# Patient Record
Sex: Female | Born: 1960 | Race: White | Hispanic: Yes | Marital: Married
Health system: Western US, Academic
[De-identification: ages and names within clinical notes are randomized; demographics above are authoritative.]

## PROBLEM LIST (undated history)

## (undated) MED FILL — APIXABAN 5 MG TABLET: 5 mg | ORAL | 96 days supply | Qty: 204 | Fill #0

## (undated) MED FILL — APIXABAN 5 MG (74 TABS) TABLETS IN A DOSE PACK: 5 mg (74 tabs) | ORAL | 30 days supply | Qty: 74 | Fill #0

## (undated) MED FILL — APIXABAN 5 MG TABLET: 5 mg | ORAL | 30 days supply | Qty: 60 | Fill #0

---

## 2021-06-11 IMAGING — MR RM - CRANIO (ENCEFALO)
10 of 13 series · 20 of 48 positions shown · non-contrast
Comparison: none

[Series 3: FLAIR · sagittal · 5.0mm · 0.49mm/px · 2 of 24 slices shown (1 of 2)]
[im 1/24]
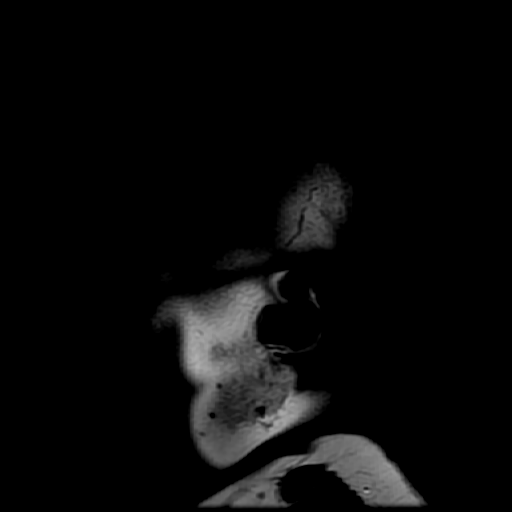
[im 24/24]
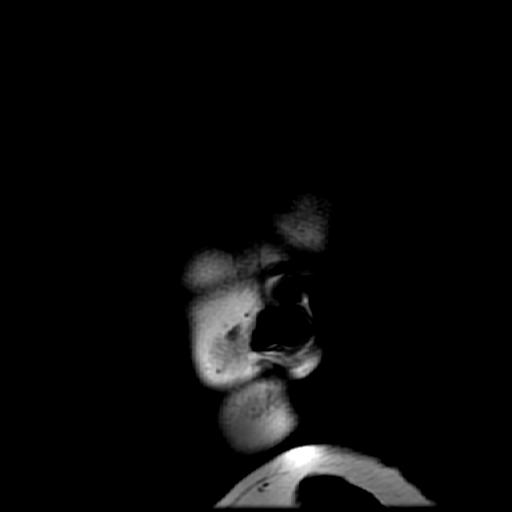

[Series 4: FLAIR · axial · 5.0mm · 0.47mm/px · z∈[-49,+99]mm · 2 of 24 slices shown (2 of 2)]
[im 1/24]
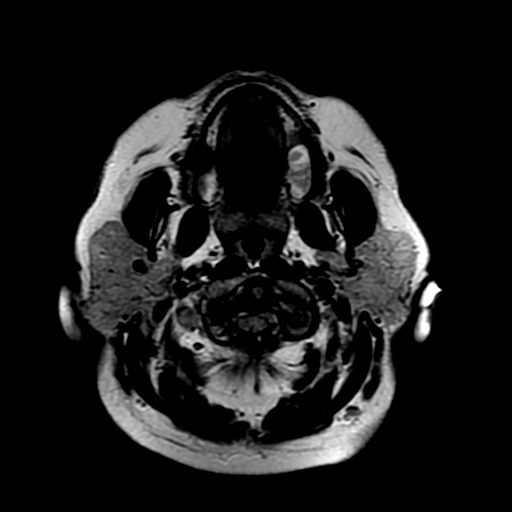
[im 24/24]
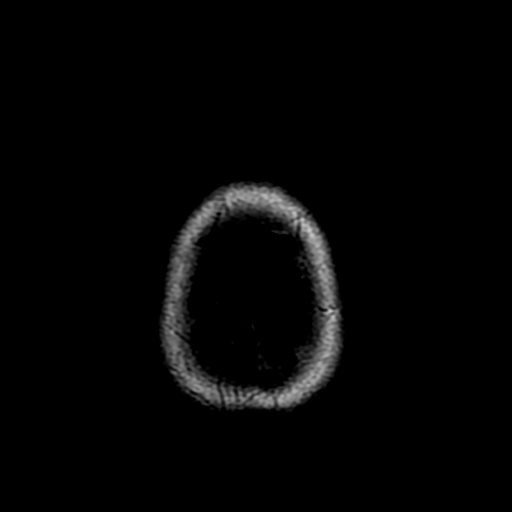

[Series 5: T2 · axial · 5.0mm · 0.47mm/px · 1 of 24 slices shown (1 of 2)]
[im 1/24]
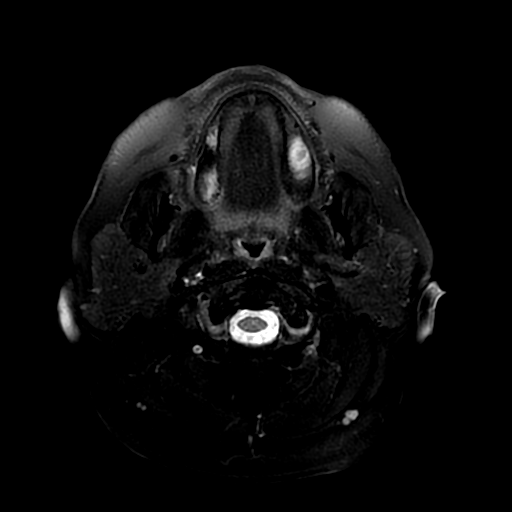

[Series 6: ax difusao · axial · 5.0mm · 0.94mm/px · z∈[-49,+99]mm · 3 of 48 slices shown]
[im 1/48]
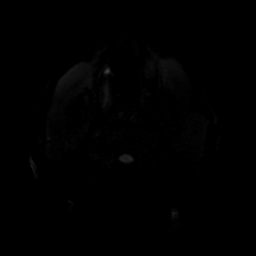
[im 24/48]
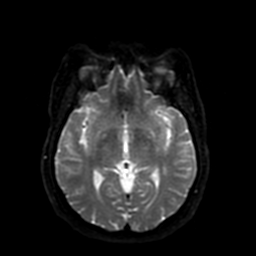
[im 48/48]
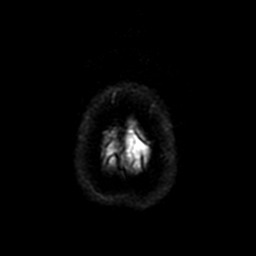

[Series 7: T2 · coronal · 4.5mm · 0.47mm/px · 1 of 24 slices shown (2 of 2)]
[im 1/24]
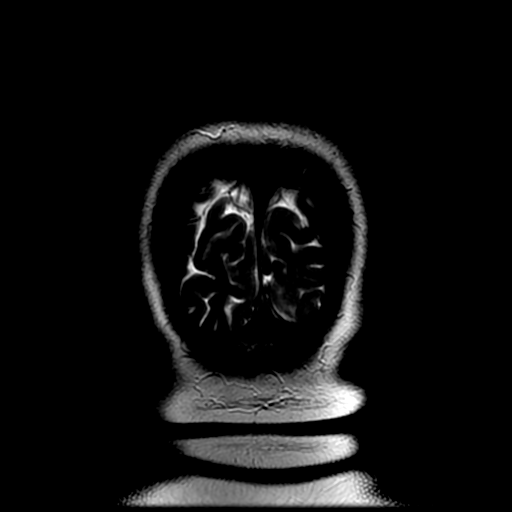

[Series 650: ADC · axial · 5.0mm · 0.94mm/px · 1 of 24 slices shown]
[im 1/24]
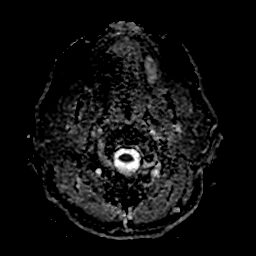

[Series 800: ax ref · axial · 1.4mm · 0.50mm/px · z∈[-88,+109]mm · 2 of 34 slices shown]
[im 1/34]
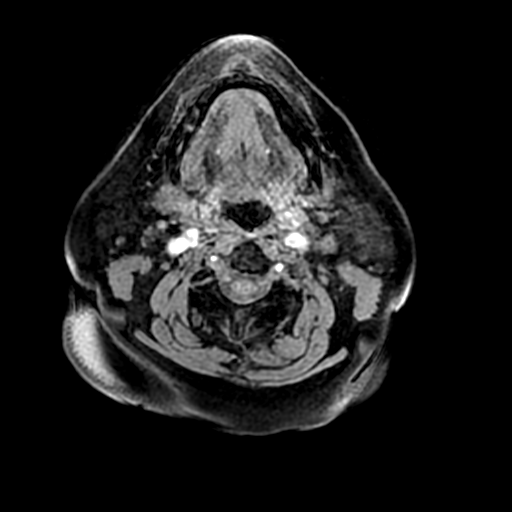
[im 34/34]
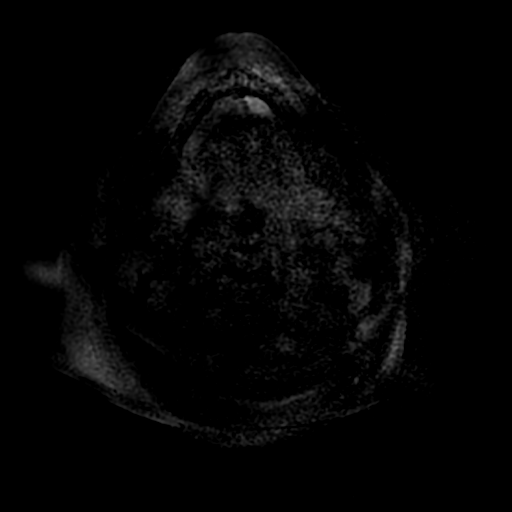

[Series 801: sag ref · sagittal · 1.1mm · 0.50mm/px · 2 of 40 slices shown]
[im 1/40]
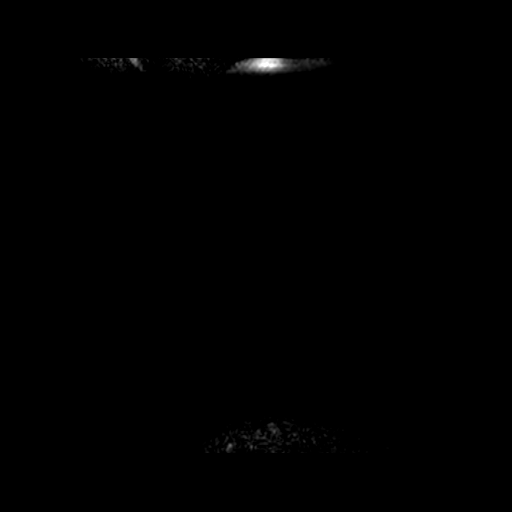
[im 40/40]
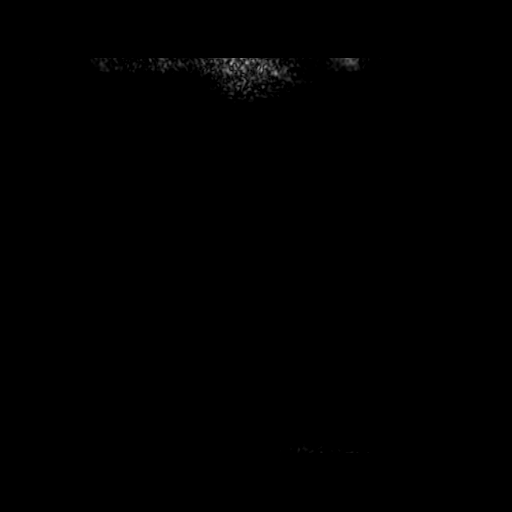

[Series 802: cor ref · coronal · 1.1mm · 0.50mm/px · 2 of 42 slices shown]
[im 1/42]
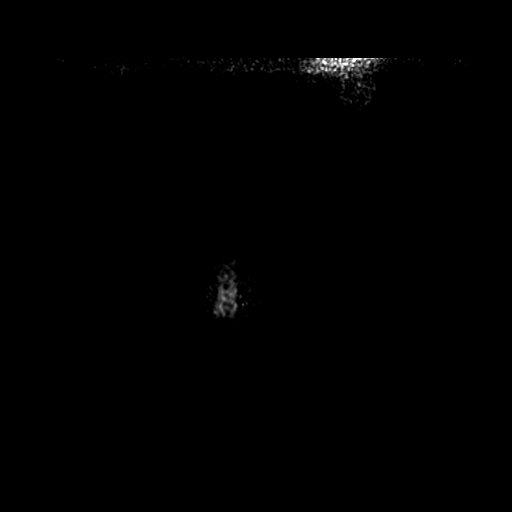
[im 42/42]
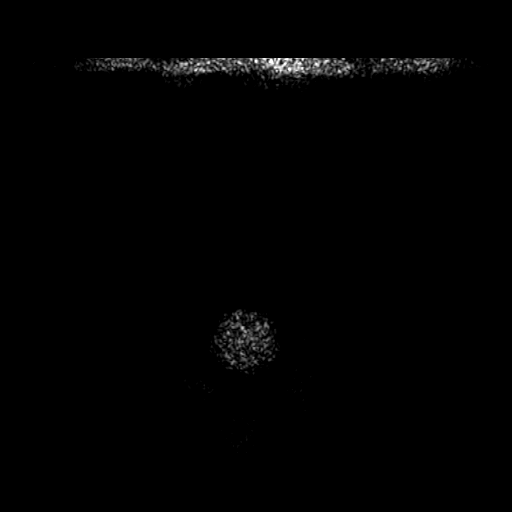

[Series 901: SWI · axial · 2.0mm · 0.47mm/px · z∈[-35,+105]mm · 4 of 72 slices shown]
[im 1/72]
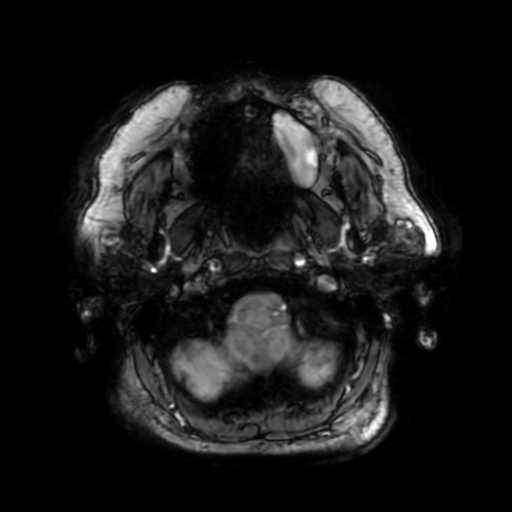
[im 24/72]
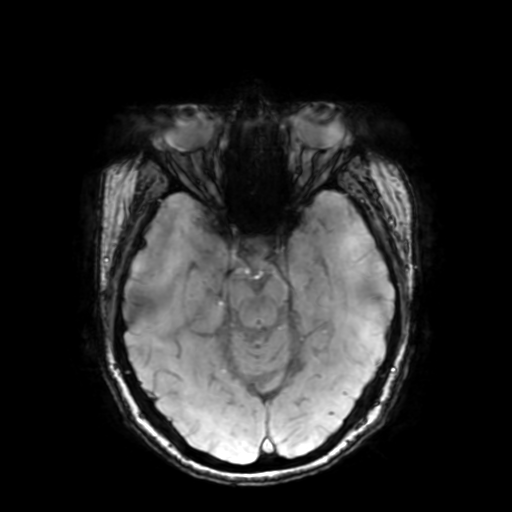
[im 48/72]
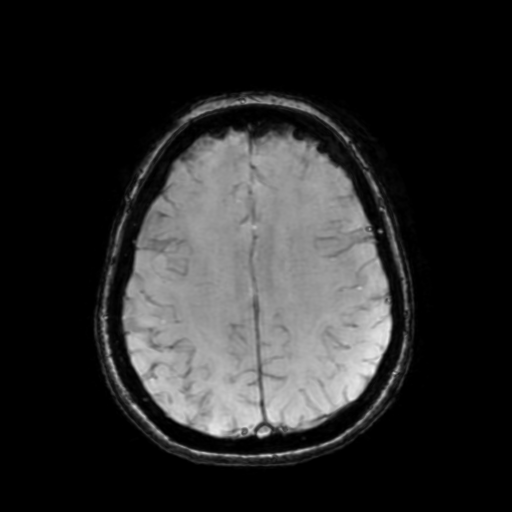
[im 72/72]
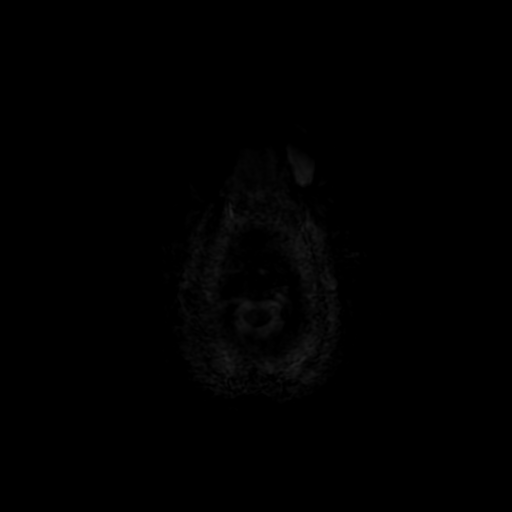

[20 of 48 positions shown; findings below may reference images not displayed]

Técnica:
Sequências multiplanares, ponderadas em T1, T2, FLAIR e Difusão.
Relatório:
Não  há  evidência  de  processo  expansivo  intracraniano,  hemorragia  intraparenquimatosa  aguda,  isquemia 
aguda/subaguda, bem como de coleções líquidas extra-axais acima ou abaixo do tentório.
Provável microangiopatia degenerativa leve ([place] 1), caracterizada por discretos focos de hipersinal em 
[facility]
T2  e  FLAIR  na  substância  branca  dos  lobos  frontais  e  patrietais,  em  situação  subcortical  e  periventricular, 
sem restrição à difusão e sem realce pelo contraste.
Hipersinal  cortical  no  giro   frontal  inferior  esquerdo  em  FLAIR,  não  expressivo  nas  demais  sequências, 
inespecífico. Artefatual?
O sistema ventricular é de topografia, morfologia e dimensões normais.
Não se identificam áreas com restrição a difusão da água na sequência ECOPLANAR.
Fluxo habitual ao nível das grandes artérias do sistema vértebro basilar e carotídeo, segundo o critério Spin-
Echo.
Sela túrcica parcialmente vazia.
Presença de megacisterna magna (variação anatômica).
Velamento quase completo do seio maxilar esquerdo por provável cisto de retenção mucoso.
Impressão:
Provável microangiopatia degenerativa leve (Fazekas 1).
Hipersinal  cortical  no  giro   frontal  inferior  esquerdo  em  FLAIR,  não  expressivo  nas  demais  sequências, 
inespecífico. Artefatual?
Velamento quase completo do seio maxilar esquerdo por provável cisto de retenção mucoso.

  Se  você  não  for  destinatário,  saiba  que  qualquer  divulgação,  cópia,  distribuição  ou  utilização  do  conteúdo  dessas 
informações é proibido e passível de punição dentro da lei.

## 2023-04-14 ENCOUNTER — Telehealth: Admit: 2023-04-14 | Discharge: 2023-04-14 | Payer: Commercial Managed Care - HMO | Attending: Physician

## 2023-04-14 DIAGNOSIS — M545 Low back pain, unspecified: Secondary | ICD-10-CM

## 2023-04-14 MED ORDER — OMEPRAZOLE 20 MG CAPSULE,DELAYED RELEASE
20 mg | Freq: Every day | ORAL | Status: AC
Start: 2023-04-14 — End: ?

## 2023-04-14 MED ORDER — MONTELUKAST 10 MG TABLET
10 | Freq: Every day | ORAL | Status: AC
Start: 2023-04-14 — End: ?

## 2023-04-14 MED ORDER — OZEMPIC 1 MG/DOSE (4 MG/3 ML) SUBCUTANEOUS PEN INJECTOR
1 mg/dose (4 mg/3 mL) | SUBCUTANEOUS | Status: DC
Start: 2023-04-14 — End: 2023-10-15

## 2023-04-14 MED ORDER — METOPROLOL SUCCINATE ER 25 MG TABLET,EXTENDED RELEASE 24 HR
25 | Freq: Every evening | ORAL | Status: AC
Start: 2023-04-14 — End: ?

## 2023-04-14 MED ORDER — BENZONATATE 100 MG CAPSULE: 100 mg | ORAL | Status: DC

## 2023-04-14 MED ORDER — BD INSULIN SYRINGE 1 ML 29 GAUGE X 1/2
1 | Freq: Two times a day (BID) | Status: AC
Start: 2023-04-14 — End: ?

## 2023-04-14 MED ORDER — LOSARTAN 25 MG TABLET
25 | Freq: Every day | ORAL | Status: AC
Start: 2023-04-14 — End: ?

## 2023-04-14 MED ORDER — HUMULIN 70/30 U-100 INSULIN KWIKPEN 100 UNIT/ML SUBCUTANEOUS: 100 unit/mL (70-30) | SUBCUTANEOUS | Status: AC

## 2023-04-14 MED ORDER — IBUPROFEN 600 MG TABLET
600 | Freq: Four times a day (QID) | ORAL | Status: AC | PRN
Start: 2023-04-14 — End: ?

## 2023-04-14 MED ORDER — ALBUTEROL SULFATE HFA 90 MCG/ACTUATION AEROSOL INHALER
90 mcg/actuation | Freq: Four times a day (QID) | RESPIRATORY_TRACT | Status: AC | PRN
Start: 2023-04-14 — End: ?

## 2023-04-14 MED ORDER — PIOGLITAZONE 30 MG TABLET
30 | Freq: Every day | ORAL | Status: AC
Start: 2023-04-14 — End: ?

## 2023-04-14 MED ORDER — JARDIANCE 25 MG TABLET: 25 mg | ORAL | Status: DC

## 2023-04-14 MED ORDER — ONETOUCH VERIO TEST STRIPS
Status: AC
Start: 2023-04-14 — End: ?

## 2023-04-14 MED ORDER — ATORVASTATIN 20 MG TABLET
20 mg | Freq: Every day | ORAL | Status: DC
Start: 2023-04-14 — End: 2023-10-15

## 2023-04-14 MED ORDER — METFORMIN 1,000 MG TABLET
1000 | Freq: Two times a day (BID) | ORAL | Status: AC
Start: 2023-04-14 — End: ?

## 2023-04-14 NOTE — Patient Instructions (Signed)
Bariatric Surgery: New Patient Information     Welcome to the [facility]!  Your surgeon has asked that you complete this list of tests and studies before you can be a candidate for bariatric surgery.  You do not need to repeat any of these tests if they have already been done in the last 1-2 years.  Just send us a copy of the final report for us to review.    Prior to scheduling surgery, the following tests will be needed. This can be done at [facility], or in conjunction with the patient's PCP.  __ ECG to document preoperative baseline, and completion of the Duke Activity Survey Index questionnaire in clinic.  (If patient exercise tolerance is <4 METs, patient will require a cardiac stress test).  __ Labs: CBC, CMP, lipid panel, iron panel, vitamin D 25OH, PT/INR, PTT, hemoglobin A1c, TSH, PTH.   __ H. pylori screening antibody. If positive, infection can be confirmed with urea breath test or stool antigen and the infection should be treated.  __ Evaluation by a mental health care practitioner (psychiatrist, psychologist, LSW, or therapist).  __ Evaluation by a dietitian.  __ Follow up in the [facility] with our bariatric PA.  __ Evaluation by a Primary Care Provider and letter of recommendation. The letter should document your dieting history and that all health maintenance screening, particularly cancer screenings, are up-to-date.    In addition, your insurance company may have additional requirements.  You should contact your insurance company now, to ensure that they cover bariatric surgery for you, and what their requirements are.    What should I do after my pre-operative items are complete?  Contact our clinic by phone or through MyChart.  If you completed your pre-operative items outside of [facility], please let us know so we can help determine if copies of the results need to be faxed our mailed to us.    [facility]  Surgery [facility]  [address] [place] [address]  tel: [phone], option [phone]  fax: [phone]  web: [email or web address]    Once the results have been reviewed by our team, we will schedule a pre-operative video visit appointment with your surgeon. It is at this appointment that a surgery date will be scheduled as well.       Bariatric Surgery 101: The Basics  Bariatric surgery is intended for people who have a Body Mass Index (BMI) of 40 or greater and who have not had success with other weight loss therapies such as diet, exercise, and medications. A person with a BMI of 35 or greater, with one or more obesity related co-morbidity, may also be considered for bariatric surgery.     Bariatric surgery has a history of helping patients effectively transform their health. Bariatric surgery restricts the amount of food patients can eat and makes them feel satiated much earlier in the digestive process. As a tool, bariatric surgery has impressive long-term weight loss results and, in many cases, has resolved or improved associated diseases like diabetes, high blood pressure, sleep apnea, painful joints, and stress urinary incontinence.      Bariatric surgery can give you a chance to change your life, provided you are ready to work hard and make a renewed and lasting commitment to a healthy lifestyle!    There are some important things to know about bariatric surgery:    Bariatric surgery is not cosmetic surgery.  Bariatric surgery does   not involve the removal of adipose tissue (fat) by suction or surgical removal.  You must understand the benefits and risks.  You must commit to long-term lifestyle changes, including diet and exercise, which are key to the success of bariatric surgery.  Complications after surgery may require further operations.    What are the Bariatric Procedures Offered at [facility]?    Gastric Bypass Surgery  The Roux-en-Y Gastric Bypass - often called gastric bypass - is considered the gold standard of  weight loss surgery.    The Procedure  There are two components to the procedure. First, a small stomach pouch, approximately one ounce or 30 milliliters in volume, is created by dividing the top of the stomach from the rest of the stomach. Next, the first portion of the small intestine is divided, and the bottom end of the divided small intestine is brought up and connected to the newly created small stomach pouch. The procedure is completed by connecting the top portion of the divided small intestine to the small intestine further down so that the stomach acids and digestive enzymes from the bypassed stomach and first portion of small intestine will eventually mix with the food.    The gastric bypass works by several mechanisms. First, similar to most bariatric procedures, the newly created stomach pouch is considerably smaller and facilitates significantly smaller meals, which translates into less calories consumed. Additionally, because there is less digestion of food by the smaller stomach pouch, and there is a segment of small intestine that would normally absorb calories as well as nutrients that no longer has food going through it, there is probably to some degree less absorption of calories and nutrients.    Most importantly, the rerouting of the food stream produces changes in gut hormones that promote satiety, suppress hunger, and reverse one of the primary mechanisms by which obesity induces type 2 diabetes.    Advantages  Produces significant long-term weight loss (60 to 80 percent excess weight loss)  Restricts the amount of food that can be consumed  May lead to conditions that increase energy expenditure  Produces favorable changes in gut hormones that reduce appetite and enhance satiety  Typical maintenance of >50% excess weight loss    Disadvantages  From a surgical perspective, it is a more technically complex operation when compared to the sleeve.  The complication rates, while very low, are  about double those of the sleeve.    With the malabsorption component, one can develop micronutrient deficiencies, particularly deficits in vitamin B12, iron, calcium, and folate.  These can be prevented with a daily multivitamin.  Requires adherence to dietary recommendations, life-long vitamin/mineral supplementation, and follow-up compliance    "The Sleeve"  The Sleeve Gastrectomy - often called "the sleeve" - is performed by removing approximately 80 percent of the stomach. The remaining stomach is a tubular pouch that resembles a banana.    The Procedure  This procedure works by several mechanisms. First, the new stomach pouch holds a considerably smaller volume than the normal stomach and helps to significantly reduce the amount of food (and thus calories) that can be consumed. The greater impact, however, seems to be the effect the surgery has on gut hormones that impact a number of factors including hunger, satiety, and blood sugar control.    Short term studies show that the Sleeve is as effective as the Roux-en-Y gastric bypass in terms of weight loss.     Advantages  Restricts the amount of food the   stomach can hold  Induces rapid and significant weight loss similar to Roux-en-Y gastric bypass for the first few years.  We are now starting to gain more long-term research data about this procedure.    Requires no re-routing of the food stream (Gastric Bypass)  Involves a relatively short hospital stay.  Typically, 1 night or less.    Causes favorable changes in gut hormones that suppress hunger, reduce appetite and improve satiety    Disadvantages  Is a non-reversible procedure  Has the potential for long-term vitamin deficiencies, although less likely when compared to the gastric bypass.  The long-term results not as well understood given the sleeve is a newer procedure.    About a 30% chance of new or worsening heartburn symptoms after surgery.  Symptoms can typically be controlled with acid reducing  medication; however, uncontrolled symptoms may require an additional surgery.     Lap Band Removal   The Adjustable Gastric Band - often called the band - involves an inflatable band that is placed around the upper portion of the stomach, creating a small stomach pouch above the band, and the rest of the stomach below the band.    This operation has fallen out of favor in the [place] and across most of the world due to high failure and complication rates.  While we no longer place lap bands, we can remove the band and convert to another bariatric surgery, such as the sleeve.  While each patient is unique, the conversion can possibly be completed in one surgery or may need to be staged as two separate surgeries.       How can I decide what procedure is right for me?  Your surgeon will evaluate your overall medical condition and typically recommend one procedure over another.  But the final decision is yours - you need to choose a procedure you are comfortable with, after you have researched the risks, benefits, and differences between them.    To help inform your decision, we offer the following resources:  EMMI educational videos: These videos provided easily digestible information to help you understand each surgery.  If you do not receive a link for this, please contact our office.   Our New Patient Orientation video on our website: [email or web address]  The [facility] website: [email or web address]  [facility] Support Group This offered through our Center for pre-operative and post-operative patients, as well as for family members and other interested persons. The meetings are virtual and held on Zoom at 5pm every 3rd Wednesday of the month.  The meeting schedule and lecture topics are available on the website above. Email us at: [email or web address]. Registration is not necessary to attend the support group.     Why should I establish care with a nutritionist or  dietician?  The best long-term success after weight-loss surgery occurs in patients who regularly see a nutritionist and change their eating habits for good.    The purpose of the pre-operative evaluation is to establish this relationship and improve your understanding of essential concepts in nutrition, such as food composition, caloric content, and calorie density. A basic knowledge of the proportion of carbohydrate, protein, and fat found in different foods, as well as training in portion control, is essential for your long-term success. Your nutritionist will also assess your readiness to change dietary habits in order to maintain a low calorie, volume-restricted diet for life.    Why is a mental health evaluation needed?  Weight-loss surgery requires a lifelong   commitment to nutritional supplementation and regular visits to your bariatric surgeon and primary care provider.  Some patients cannot make this commitment because of mental illness, depression, substance abuse, financial stress, or other life-stressors.    The purpose of your mental health evaluation is to assess whether you are mentally and emotionally prepared for the surgery, understand that the surgery is not reversible, and also understand the risks and alternatives. It should specifically address the following issues:  Screening for any of these psychiatric conditions: psychosis, major depression, obsessive-compulsive disorder, major eating disorder (such as bulimia), alcohol abuse, or substance abuse.  Establish your competency in making the decision to proceed with weight loss surgery.   Establish that you fully comprehend the seriousness of the procedure and the lifestyle and behavioral changes that must take place after it is done;   Establish that you have realistic expectations about the outcome of surgery and can handle the stress of the period following surgery.    If I smoke, can I still have weight loss surgery?  If you smoke, it is  very important that you stop smoking at least 6 weeks prior to your surgery.  This will help to improve healing and decrease the incidence of post-operative complications.    What do I do if my insurance coverage changes?  If you have an insurance change, please contact our office and provided us with the updated information.  Candidacy for this procedure depends upon adequate insurance coverage and the ability to obtain the required medications after discharge. About four to six weeks before your scheduled surgery, we will request verification of coverage from your insurance plan.    What if I have questions?  We will be happy to answer any questions you may have while you are awaiting your procedure.  Should you have any additional questions, please contact us at [phone], option [phone].    Preparing For Bariatric Surgery  To help make the surgical process as straightforward as possible, we have created a patient education guide called What to Expect: Bariatric Surgery, which you should receive after your pre-operative appointment.  See below for some highlights:    After you meet your surgeon for the pre-operative appointment, their practice coordinator will give you specific instructions about the date and time of the operation. Information that will be reviewed at the visit:    You will go on a preoperative liquid diet for 2 weeks prior to surgery.  You can choose to buy premade shakes, such as Premiere Protein Shakes, or purchase a protein powder and mix yourself.   You will drink three shakes a day.  Other liquids such as broth, coffee, popsicles, and jello are ok during this time, but no solid food.  The liquid diet helps to kickstart weight loss and shrink the size of the liver, which makes the operation easier for the surgeon to perform.  You will need to stop taking any nonsteroidal anti-inflammatory drugs (NSAIDs) one week before your scheduled operation date. Plan to never take these medicines ever  again because you will now be at an increased risk of gastro-intestinal ulcers. Examples of nonsteroidal anti-inflammatory drugs (NSAIDs) are: Naprosyn, Ibuprofen, Aleve, Aspirin, Celebrex, Advil/Motrin.  Your Anesthesia Prepare provider will give you detailed instructions regarding which of your medications to take or hold before surgery.  On the day before your operation you should have nothing to eat or drink after midnight. You will also take a mild bowel prep, Magnesium Citrate, around 10am the morning   before surgery.  This is an over the counter medication.      Your Stay in the Hospital    What to bring  Please bring a photo I.D. with you to the hospital. We recommend that you leave all valuables at home. If you have sleep apnea and use a CPAP machine at night, you should bring your machine with you. You may wish to bring a light robe and slippers, a toothbrush and any other toiletries that will make you more comfortable.    The morning of the operation  Be sure not to eat or drink anything.  You will report at the designated time to the Surgery Waiting Area on the main entry floor of [facility], [address]. You should check in with the receptionist and then be seated. When told by the operating room staff, a [facility] employee will escort you to the 4th Floor to a gurney in the pre-op area. You will then get into a hospital gown and begin the preparations for the operation.    After the operation, also called Post-Op Day 0  You will spend one or two hours in the post-anesthesia care unit, then be transferred to a room on the surgical floor. If you have severe sleep apnea, asthma, or a lung condition, you might need to spend a short period in the Intensive Care Unit.   Expect to be out of bed and walking. Your nurse will tell you when you are safe to walk by yourself. The first time you walk will be with your nurse.  Let your nurse know if you have pain that prevents you from moving. Oral  medications will be used for pain management and works best when used regularly. Ask for another dose before the pain gets too bad, so that we can avoid chasing your pain.  It is common to feel sick to your stomach after surgery now that your stomach is so small. A good strategy to reduce this feeling is to take small, frequent sips of fluids. This will help to reduce the risk of dehydration as well. We can also give you medication, if needed, to reduce this feeling.    Post-Op Day 1  Expect to be tolerating small sips of oral fluids and walking often to reduce the risk of blood clots.  You will be started on an acid reduction medication to reduce your risk of developing ulcers in your digestive tract.  The dietitian will review the bariatric diet recommendations with you. This is a great time to have your diet specific questions answered.  Plan to be discharged from the hospital the day after surgery.    Post-Op Day 2  You will likely be home at this point; however, some patients may need to stay another night at the hospital.  At this point you should spend most of the day out of bed and walking. Have a family member help. You will continue a liquid diet as well as oral pain medication.    Prepare for Discharge  You will be ready for discharge is you are meeting post-surgery goals: Walking on your own, tolerating liquids, and pain is well controlled  If you have a drain in place, this will be removed before you are discharged.  You will receive all medications needed at home through the [facility] program  You will be seen for post-operative follow-up through video visit with the bariatric clinic 1-3 weeks after surgery.        Post Operation Instructions For Your Care at Home  It is very common to experience nausea and loose stools in the first month. These symptoms will go away with time.    The 24-hour contact number for the Surgery Faculty Practice is [phone].  Please call this number with any  questions you might have. Call this number as soon as possible if any of the following symptoms occur:    1. Fever of greater than 101.5F  2. Nausea or vomiting (especially if you are unable to keep liquids down)  3. Severe pain at the incision  4. Increasing redness or foul-smelling drainage from the incision  5. Uncontrolled bleeding  6. Persistent diarrhea or more than 10 bowel movements in 24 hours  7. You are not able to urinate after 8 hours  8. If you experience dizziness, lightheadedness, or extreme fatigue    Medications & Pain Control  When you leave the hospital, you will be given prescriptions for several medications, some of which you will only take for a few months and others will be lifelong. These medications must be taken as prescribed and should be started within the first week of arriving home. You will be provided detailed instructions regarding the adjustment of chronic medications and addition of new medications.  Please contact our clinic with any questions.      You will be prescribed a medicine to help with pain control. Over time you will need less and less of the medicine, and you should gradually reduce the amount you take in the weeks after surgery. While Tylenol is ok, do not take any form of non-steroidal anti-inflammatory drugs (NSAIDS) such as the ones mentioned above!     Immediately following surgery and for at least the following 6 months, you will be prescribed a medicine that decreases acid production by your stomach in order to decrease the risk of ulcers.  This medication is a proton pump inhibitor (PPI), such as omeprazole (Prilosec) capsules 20mg daily. Your surgeon may recommend that you continue using an acid reduction medication for the rest of your life.    If you still have a gallbladder, you will be prescribed a medicine called Ursodiol to help prevent the formation of gallstones. The dose is 300mg twice a day.  This should be taken for 6 months after  surgery.    Vitamins and Minerals are Required Daily  Procare bariatric multivitamin once daily  Celebrate calcium + vitamin D chewable     While you will be able to pick up your first month supply from the [facility] at [place], no prescription is needed.  We recommend ordering both online for subsequent refills as that is the most affordable options.  Go to [email or web address] or [email or web address] for the multivitamin.  Go to [email or web address].com for the calcium chews.      Activity  It is common to feel a little more tired than usual in the first couple of weeks after surgery. This is to be expected, and you should listen to your body to determine the appropriate level of activity.    You may return to full activity, including work, when you feel ready.  After a minimally invasive surgery this usually occurs within two weeks.  If you had minimally invasive surgery, you may want to wait 4-6 weeks before resume rigorous activity.   You may perform normal activities, such as walking up and down stairs, walking outside the house, doing chores about the house and riding in a car   when you feel up to it.  You may drive after surgery as long as long as you are not taking any opioid pain medication.    Bowel Movements  You will likely have irregular bowel movements after bariatric surgery, but they will normalize over time.  If you have not had a bowel movement 4 days past your surgery, we recommend taking a dose of Milk of Magnesia. This will help for acute constipation by speeding up bowel motility.  To prevent constipation, we recommend adding powder fiber (Benefiber or Metamucil) with increased fluid intake, stool softner (Colace), smooth move tea, or small doses of prune juice.     Wound Care  You may take showers after your operation. Let the soap and water run over your incisions. If you had a gastric bypass, expect some drainage where the drain was removed.  This is normal and will resolve after a few days.    Follow-up  Contacts  Your first postoperative appointment should be scheduled within 1-3 weeks of leaving the hospital. If you don't have an appointment scheduled, MyChart your surgeon's office.    It is recommended that you meet regularly with a dietitian and join a support group in your community to assist in your lifestyle adjustments.     Post-Operative Follow-up Appointments  You will be seen by a member of our bariatric team via video visit.  If you wish to have a follow-up appointment with the dietitian, please let us know so the visit can be scheduled accordingly:  1-3 weeks after discharge  3 months post-op  6 months post-op  12 months post-op  every year thereafter    Monitoring lab values is important to avoid complications of malnutrition. Labs should start with the 3 month visit, and be drawn about 2 weeks before your appointment so the results can be discussed with your surgeon.  If you are getting your labs drawn at your local hospital, bring the printed results with you to your appointment.  These labs are recommended:  Complete blood count  Complete metabolic panel  Lipid Panel  Iron panel  Folate, thiamine, copper, iron, ferritin, selenium, zinc, vitamin A, D, B12  Hemoglobin A1C  PTH     Approach to Diet After Your Operation  The diet guidelines are designed to limit calories while providing a balanced meal plan to help prevent nutrient deficiencies and preserve muscle tissue. Tolerance and progression will vary greatly from patient to patient.    General Guidelines  Eat slowly and chew small bites of food thoroughly.  When you first start taking solids, avoid rice, bread, raw vegetables, and meats that are not easily chewed such as pork and steak. Ground meats are usually better tolerated.  Eat balanced meals with small portions.  Keep a daily record of your food portions, plus calorie and protein intake.  Concentrate on following a diet low in calories and sweets.  Avoid sugar, sugar-containing foods and  beverages, concentrated sweets, and fruit juices.    Fluids  Drink extra water and low calorie or calorie-free fluids between meals to avoid dehydration. Sip about one cup of fluid between each small meal, 6 to 8 times a day. At least two liters (64 ounces or 8 cups) of fluid a day is recommended. You will gradually be able to meet this target.    We strongly warn against the use of any alcoholic beverages. Alcohol will get absorbed into your system much more quickly than before, and the sedative and   mood- altering affects are more difficult to predict and control.    Protein  Preserve muscle tissue by eating foods rich in protein. High protein foods are eggs, meats, fish, seafood, tuna, poultry, soy milk, tofu, cottage cheese, yogurt, and other milk products. Your goal should be a minimum of 60-80 grams of protein per day. Do not worry if you cannot reach this goal in the first couple months after your operation.     Diet Progression After Bariatric Surgery  Please reference our "Diet after Bariatric Surgery" education guide for detailed directions regarding diet advancement after surgery.

## 2023-04-14 NOTE — H&P (Unsigned)
HPI  Thank-you for referring Susan Goodwin to the [facility] at the [place] [facility] [place] [place].  As you know, she is a 61 year old with severe obesity and obesity-related comorbidities who presents for consideration of bariatric surgery.  This is the patient's first visit to the [facility].    Today's  .  Her BMI (body mass index) today is calculated at , based on her disclosed height of: Marland Kitchen  Her goal weight is *** pounds.     She has tried multiple conservative measures to achieve weight loss, including dietary, pharmacologic, or exercise programs. Although she has been able to lose some weight from diets and exercise, over time, the weight has always returned, and she has remained morbidly obese.  She has reviewed the [facility] new bariatric patient orientation videos in order to understand the risks and the benefits of bariatric surgery. She now feels committed to proceeding with an operation to help with the weight loss and presents today for further consideration of bariatric surgery.       There is no problem list on file for this patient.    No past medical history on file.  No past surgical history on file.   Allergies/Contraindications  Not on File  No current outpatient medications on file.     No current facility-administered medications for this visit.     Social History     Socioeconomic History    Marital status: Married     Spouse name: Not on file    Number of children: Not on file    Years of education: Not on file    Highest education level: Not on file   Occupational History    Not on file   Tobacco Use    Smoking status: Not on file    Smokeless tobacco: Not on file   Substance and Sexual Activity    Alcohol use: Not on file    Drug use: Not on file    Sexual activity: Not on file   Other Topics Concern    Not on file   Social History Narrative    Not on file     Social Determinants of Health     Financial Resource Strain: Not on file   Food Insecurity:  Not on file   Transportation Needs: Not on file   Housing Stability: Not on file     No family history on file.    Physical Exam  There were no vitals filed for this visit.  There is no height or weight on file to calculate BMI.  Constitutional:       Appearance: Patient is well-developed. Patient is not diaphoretic.   Pulmonary:      Effort: Pulmonary effort is normal. No respiratory distress.   Abdominal:      General: She is obese   Skin:     Coloration: Skin is not pale.   Neurological:      Mental Status:She  is alert and oriented to person, place, and time.   Psychiatric:         Behavior: Behavior normal.         Thought Content: Thought content normal.         Judgment: Judgment normal.     D[facility] Activity Survey Index       No data to display              Assessment and Plan:  In summary, Susan Hesselbach  Kiomara Goodwin is a 62 year old female with morbid obesity and associated comorbidities.  She has a BMI of .  She presents for consideration of bariatric surgery.  She does satisfy the criteria set forth for bariatric surgery by ASMBS/IFSO, published in 2022.    Prior to scheduling surgery, the following tests will be needed. This can be done at [facility], or in conjunction with the patient's PCP.  {barinewpreoprequirements:36251::"__ ECG to document preoperative baseline, and completion of the [employer] Activity Survey Index questionnaire in clinic.  (If patient exercise tolerance is <4 METs, patient will require a cardiac stress test).","__ Labs: CBC, CMP, lipid panel, iron panel, vitamin D 25OH, PT/INR, PTT, hemoglobin A1c, TSH, PTH. ","__ H. pylori screening antibody. If positive, infection can be confirmed with urea breath test or stool antigen and the infection should be treated.","__ Evaluation by a mental health care practitioner (psychiatrist, psychologist, LSW, or therapist).","__ Evaluation by a dietitian.","__ Follow up in the [facility] with our bariatric PA.","__ Evaluation by a Primary Care Provider and letter  of recommendation. The letter should document your dieting history and that all health maintenance screening, particularly cancer screenings, are up-to-date."}    I have provided the patient with the list of these required tests and consultations.    We discussed benefits and risks of gastric bypass surgery.  We explained a complication rate of 12%, and a mortality rate of 0.2%.  Specific complications were explained in detail, including leakage from the gastrojejunostomy or jejunojeunostomy, pulmonary embolism, pneumonia, deep vein thrombosis, renal failure, stroke, heart attack, hernia, wound infection, bleeding, nausea, marginal ulceration, bowel obstruction, persistent obesity, weight regain, and dumping syndrome. We also explained our current understanding of how gastric bypass works: the small gastric pouch restricts oral intake. Additionally, gastrointestinal hormonal secretion is altered, which in turn increases satiety.  We also explained that the excluded stomach cannot be visualized by regular endoscopy after the gastric bypass, nor is the biliary tree accessible by endoscopy. Also, we explained the possibility for conversion to an open procedure because of extensive adhesions, bleeding, or inadequate exposure.  We quoted a likelihood for conversion to be less than 2%.    We discussed sleeve gastrectomy as another weight-loss option.  Specific complications of sleeve gastrectomy include nausea or vomiting, leakage (<1%), bleeding (<2%), heartburn (30-40%), and weight-regain.  The mortality rate is 0.1%.    We explained the eventual need for plastic surgery for excessive skins folds after weight loss. Additionally, we spoke about the need for consistent long-term postoperative follow-up with scheduled clinic visits and laboratory tests, changes in lifestyle, diet, and eating habits and the need for supplemental calcium and vitamins was explained.    The patient understands that surgery is only a tool to  help with weight loss, and that she will need to maintain a carefully organized and attentive nutritional program with the help of a dietician both before and after surgery.  She understands these plans, the technical aspects of surgery and risks involved, again including both those associated with anesthesia and surgery.  The patient was given the opportunity to ask questions, and all questions were answered.    After our thorough discussion today, she is primarily interested in the {bariatricsurgeries:38194} procedure, and this discussion will be reaffirmed at our next clinic visit.    Documentation of Visit Statement  I spent a total of 75 minutes in face-to-face time with the patient and in non-face-to-face activities conducted today, 04/12/23, directly related to this video visit, including reviewing  records and tests, obtaining history and exam, placing orders, communicating with other healthcare professionals, counseling the patient, family, or caregiver, documenting in the medical record, and/or care coordination for the diagnoses above.    I performed this consultation using real-time Telehealth tools, including a live video connection between my location and the patient's location. Prior to initiating the consultation, I obtained informed verbal consent to perform this consultation using Telehealth tools and answered all the questions about the Telehealth interaction.  I performed this visit outside a [facility] faciliity.    Renato Shin, am acting as a Neurosurgeon for services provided by Alroy Dust, MD on 04/14/2023 11:34 AM    ***

## 2023-05-19 ENCOUNTER — Other Ambulatory Visit: Payer: Commercial Managed Care - HMO | Attending: Registered"

## 2023-05-19 NOTE — Telephone Encounter (Signed)
Estimated body mass index is 57.61 kg/m as calculated from the following:    Height as of 04/14/23: 157.5 cm (5\' 2" ).    Weight as of 04/14/23: 142.9 kg (315 lb).    Consult Date: 04/14/23  Surgeon: Dr. Aundria Rud    09/22/23 -  ECG  NSR    09/22/23 - NM Stress test  1.  Negative study for ischemia or scar.  2.  Overall gated left ventricular systolic function was normal with a calculated LVEF of 66% at stress and 63% at rest.  3.  No left ventricular dilatation    Stress EKG Report  1. The ECG portion of this test is negative for Lexiscan induced ischemia.  2. Scintigraphy (nuclear imaging) is reported separately.    05/20/23 - Upper GI Endoscopy (scan clin)  - The nasopharynx was normal  - The examined esophagus was normal  - Localized mildly erythematous mucosa without bleeding was found in the gastric antrum. Biopsies were taken with a cold forceps for histology.  - The examined duodenum was normal.    Stomach, body and entrance, biopsy:  - Chronic inactive gastritis  - No intestinal metaplasia, dysplasia, Helicobacter pylori or neoplasm is evident    07/16/20 - Colonoscopy  - One 3 mm polyp in the sigmoid colon, removed with a cold biopsy forceps. Resected and retrieved.  - Diverticulosis in the sigmoid colon and in the descending colon    09/22/23 -  Pre Op Evaluation Labs  CMP ALT 23   CBC wnl   Lipid panel wnl    PT/INR, PTT wnl   hemoglobin A1C  6.7   TSH, PTH  wnl   Iron, ferritin  wnl   Vitamin D 25(OH) 65   H.pylori screening (05/20/23) Negative on EGD     05/21/23 -  Evaluation by a mental health care practitioner for clearance for bariatric surgery  Cleared     07/02/23 -  Evaluation by your Primary Care Provider and letter of recommendation/clearance for bariatric surgery  Cleared  (scan clin)    05/19/23 -  Evaluation by a nutritionist or dietician  Cleared     Alcohol, drug, and tobacco screening completed: Yes    Insurance: [employer] which requires 3 month  07/19/23  08/16/23  09/14/23  09/30/23

## 2023-05-19 NOTE — Progress Notes (Signed)
This consultation was performed using real-time Telehealth tools, including a live video connection between my location and the patient's location. Prior to initiating the consultation, verbal consent was obtained to perform this consultation using Telehealth tools and answered all the questions about the Telehealth interaction.    Nutrition Evaluation:    DOS: 05/19/23    [facility]    Susan Goodwin  is a  62 y.o. yo female with morbid obesity for bariatric surgery nutrition evaluation.  Pt. is interested in gastric bypass surgery procedure.         Nutrition -related medical history: DM ( ozempic, OHA), HTN, GERD, OA, OSA, asthma, CKD1, HLD      Reason for surgery:  "knees painful; no surgery untl weight loss but did not tell me how.."; for health    Known bariatric patients?  Yes    Successful?              Yes          Anthropometric Data and Assessment:    Physical:    HT   Ht Readings from Last 1 Encounters:   04/14/23 157.5 cm (5\' 2" )       WT:   Wt Readings from Last 3 Encounters:   04/14/23 (!) 142.9 kg (315 lb)       IBW:  286% IBW (110# +10%).  High wt: 317#.  Goal wt: 150- 160#.    Ideal Body Weight: 50.01 kg    Calculation weight BMI: 57.6 kg/m^2 -  Severe   obesity    Weight history:  Gain began with DM dx d/t stated medications - Use to be 140# pre DM  Dieting history  Multiple diet programs, pills w/o permanent success.      Physical Activity:   Physical activity details: limited - "can only walk wtih two canes in house" - electric chair outside        Food and Nutrition Intake History:   Information obtained from: Information Provided By: Patient      Oral Food and Beverage Intake    B - toast/ jelly/ butter, black coffee; drinks "a lot of water"; L - vegetables omelette, one tortilla; sn - fruits, vegetables; D - chili rellanos cooked or take out; vegetables/ greens;  sn - bean taco.      Other:  Notes sometimes grab cookies when no one around to give food - Cannot prepare  meals d/t mobility issues; spouse on call and listening to advise about vegetable tray  with protein foods      Assessment:  Inadequate fiber/plant foods; excess total/refined carbohydrates (starch, snacks); excess total/saturated fats (dairy, meats, added, snacks); inadequate free fluids; irregular meals at times      Nutrition Diagnosis:     Excess energy intake and inadequate physical activity related to food/nutrition knowledge deficit as evidenced by diet recall and morbid obesity.      Nutrition Intervention/Goals:  [x]   Evaluation for bariatric surgery completed.  Pt. appears to be a good candidate for bariatric surgery d/t comorbidities and history of unsuccessful weight loss attempts.  Pt. could possibly lose weight with professional supervision and has proven weight loss with diet/exercise but at a very slow rate.    [x]   Post-bariatric surgery MNT reviewed with written material provided to include progression of diet from liquid to pureed to soft based on surgery type and daily need for vitamin/mineral supplementation with written information provided.  Bariatric plate with visual reviewed to include  goals for successful long-term weight loss.  [x]   Plate method of meal planning discussed with written material provided to include post bariatric plate and post surgery diet progression to assist with weight loss pre-surgery and to begin preparation for post surgery.  Continue nutrition education highly recommended prior to surgery to aid success.       1) Plan regular meals with portion control & balanced nutrients:  Lean protein + vegetables + less starch/fats - 3 meals, minimal snacks       2) Reduce total fat/carbohydrate - Improve choices, healthier options, use less       3) Limit liquid sugar & alcohol - Use sugar free, dilute, eliminate       4) Increase fiber/plant foods - non-starchy vegetables       5) Adequate free fluids intake - 6-8 cups/day and more       6) Search for bariatric recipes,  support  [x]   Importance of regular, consistent exercise reviewed with goal of 60 min./day for successful weight loss.  Begin to establish habit:   20-30 minutes 3-5x/wk and increase to 60+ minutes each day, as tolerated.  Consider chair or pool exercise.  [x]   Self -monitoring methods discussed to help record intake, exercise, lifestyle habits to aid in increased weight loss success and to review with RD at post-op follow-up appt.  Consider utilizing nutrition and exercise web sites, applications, note book pre-surgery to aid change.  [x]   Eating Behavior:  Begin addressing reasons behind eating behaviors and seek new coping skill.  Elicit aid of mental health professional and seek other support to aid change.  [x]   Nutrition Education:  Seek continued nutrition education prior to surgery to aid success - [email or web address].org/ and other web resources discussed.  Request RD referral.  Seek weight loss programs, i.e., [facility]      Nutrition Monitoring & Evaluation:  [x]   Pt will seek continued nutrition education and MNT for weight management prior to surgery.  [facility], [facility] Management Program information provided  [x]   Pt. to meet with hospital RD s/p surgery  [x]   Pt. to arrange f/u RD visit at [facility], [facility] or with local RD.  Contact information provided.  [x]   Pt. to seek f/u support at [facility] or local support group.  Internet resources encouraged.  Barriers to learning:  None     Verbalize understanding:  Yes  Compliance expectancy:  Expected to be fair-good.  Pt. appears motivated to improve health via successful and permanent weight loss but would benefit from support.      Remonia Richter, RD, CDE  [place] bariatric dietitian

## 2023-05-21 ENCOUNTER — Telehealth: Payer: Commercial Managed Care - HMO | Attending: Forensic Psychiatry

## 2023-05-21 DIAGNOSIS — E669 Obesity, unspecified: Secondary | ICD-10-CM

## 2023-05-21 NOTE — Progress Notes (Unsigned)
Susan Goodwin, M.D.  [facility]  [address], 6[address]  [place], [place], [address]    05/21/2023    I had the pleasure of seeing Susan Goodwin for the purpose of a psychiatric evaluation prior to possible bariatric surgery. The patient is a 62 y.o. female with severe obesity who is being considered for bariatric surgery.     I performed this consultation using real-time Telehealth tools, including a live video connection between my location and the patient's location. Prior to initiating the consultation, I obtained informed verbal consent to perform this consultation using Telehealth tools and answered all the questions about the Telehealth interaction.     {Time spent options (optional):28235::" "}       The patient has no formal psychiatric history. The patient has not received inpatient or outpatient mental health treatment. The patient has not received psychotropic medications. The patient denied a history of suicide attempts, suicidal ideation, and violence. The patient has no history of manic or psychotic symptoms. The patient does not currently endorse symptoms that would meet the diagnostic criteria for a depressive disorder. The patient does not endorse a history of depressive episodes. The patient does not endorse having been exposed to stressors that would be expected to predispose the patient to develop symptoms of posttraumatic stress disorder. The patient does not endorse symptoms that would meet the diagnostic criteria for an anxiety disorder. The patient denies a history of eating disorder symptoms.    The patient's social history is listed here:    Social History     Socioeconomic History    Marital status: Married     Spouse name: Not on file    Number of children: Not on file    Years of education: Not on file    Highest education level: Not on file   Occupational History    Not on file   Tobacco Use    Smoking status: Never    Smokeless tobacco: Not on  file   Substance and Sexual Activity    Alcohol use: Not Currently    Drug use: Never    Sexual activity: Not on file   Other Topics Concern    Not on file   Social History Narrative    Not on file     Social Determinants of Health     Financial Resource Strain: Medium Risk (04/13/2023)    Overall Financial Resource Strain (CARDIA)     Difficulty of Paying Living Expenses: Somewhat hard   Food Insecurity: Food Insecurity Present (04/13/2023)    Hunger Vital Sign     Worried About Running Out of Food in the Last Year: Often true     Ran Out of Food in the Last Year: Sometimes true   Transportation Needs: No Transportation Needs (04/13/2023)    PRAPARE - Therapist, art (Medical): No     Lack of Transportation (Non-Medical): No   Housing Stability: High Risk (04/13/2023)    Housing Stability Vital Sign     Unable to Pay for Housing in the Last Year: Yes     Number of Times Moved in the Last Year: Not on file     Homeless in the Last Year: Not on file       The patient identified post-operative supports as ***. These individuals are healthy enough to help the patient post-operatively. The patient denied a family history of substance use problems. The patient denied a  family history of psychiatric problems. The patient denied a family history of suicidality.     The patient now presents for pre-operative evaluation for bariatric surgery.      The patient's history, allergies, and medications are:    Past Medical History:   Diagnosis Date    Acid reflux     Asthma     Diabetes mellitus (CMS code)     Essential hypertension     High cholesterol     History of esophagogastroduodenoscopy (EGD)     Knee pain     Low back pain     Obesity     Sleep apnea     Uterine cancer (CMS code)        Past Surgical History:   Procedure Laterality Date    ANKLE FRACTURE SURGERY      CARPAL TUNNEL RELEASE      FINGER SURGERY      screw placement    HYSTERECTOMY      NASAL FRACTURE SURGERY      SHOULDER SURGERY       TUBAL LIGATION         Allergies/Contraindications   Allergen Reactions    Atorvastatin      myalgia    Lovastatin      diarrhea    Lisinopril      bad cough   bad cough    bad cough       Current Outpatient Medications   Medication Sig Dispense Refill    albuterol 90 mcg/actuation metered dose inhaler Inhale 2 puffs into the lungs continuous prn      BD INSULIN SYRINGE 1 mL 29 gauge x 1/2" SYRINGE 2 (two) times daily      HUMULIN 70/30 U-100 KWIKPEN 100 unit/mL (70-30) injection pen Inject 40 Units under the skin in the morning and 40 Units in the evening. Inject before meals. Holds for blood sugar less than 100.      ibuprofen (ADVIL,MOTRIN) 600 mg tablet Take 1 tablet (600 mg total) by mouth 4 (four) times daily as needed      losartan (COZAAR) 25 mg tablet Take 1 tablet (25 mg total) by mouth daily      metFORMIN (GLUCOPHAGE) 1,000 mg tablet Take 1 tablet (1,000 mg total) by mouth 2 (two) times daily      metoprolol succinate (TOPROL-XL) 25 mg 24 hr tablet Take 1 tablet (25 mg total) by mouth every evening      montelukast (SINGULAIR) 10 mg tablet Take 1 tablet (10 mg total) by mouth daily      omeprazole (PRILOSEC) 20 mg capsule Take 1 capsule (20 mg total) by mouth      ONETOUCH VERIO TEST STRIPS test strip TEST BLOOD SUGAR TWO TIMES A DAY. E11.65      OZEMPIC 1 mg/dose (4 mg/3 mL) injection pen Inject 1 mg under the skin every 7 (seven) days      pioglitazone (ACTOS) 30 mg tablet Take 1 tablet (30 mg total) by mouth daily      atorvastatin (LIPITOR) 20 mg tablet Take 1 tablet (20 mg total) by mouth daily       No current facility-administered medications for this visit.       The patient has attended an orientation regarding bariatric surgery. The patient has met with a nutritionist. The patient has completed/is completing the pre-operative work-up. The patient is making dietary changes in the form of ***. The patient is engaging in exercise in the form  of ***. The patient has/has not attended bariatric  surgery support group meeting(s). The patient demonstrated an ability to discuss the type of bariatric surgery being considered. The patient demonstrated a limited understanding of surgical risks. The patient understood the need for ongoing lifestyle changes following bariatric surgery. The patient feels capable of adhering to the post-operative diet. The patient appears to have a stable and available support network. The patient demonstrated realistic goals for the surgery.     Mental Status Examination: The patient presented as an obese but otherwise well-developed and well-nourished appearing individual. The patient was appropriately dressed and groomed. The patient did not display psychomotor agitation or slowing. The patient did not display abnormal or involuntary movements. The patient was polite and cooperative. The patients speech was fluent, articulate, and not pressured. The patients thought process was linear, logical, and goal directed. The patient denied suicidal and homicidal ideation. The patient did not endorse psychotic symptoms. The patients mood was characterized as as noted above. The patients affect appeared euthymic, appropriate, and reactive. The patient did not demonstrate significant cognitive deficits during our meeting. The patients insight and judgment appeared intact.     Impression: Based on my examination, it is my impression the patient is an acceptable candidate for bariatric surgery from a psychiatric perspective. The patient's history is consistent with the diagnosis/diagnoses of:      This condition/These conditions would not be expected to interfere with the patient's ability to undergo bariatric surgery or to adhere to the post-operative lifestyle changes.    The patient does not meet the diagnostic criteria for a mood, anxiety, eating, cognitive, substance use, or psychotic disorder. In light of these considerations, the patient does not have any psychiatric  contraindications to undergoing bariatric surgery. Further, the patient does not demonstrate evidence of any psychiatric issues that would interfere with the ability to adhere to post-procedure lifestyle changes. The patient appears to have the capacity to provide informed consent for a bariatric procedure.      Recommendations: For obesity and bariatric surgery, I would recommend that the treatment team provide the patient with ongoing education about the procedure, the attendant risks, and the post-operative requirements in order to reinforce the patients understanding of this information. Involving members of the patients support network in these discussions may further increase the likelihood of ongoing compliance in the post-operative period. The patient was provided information about lifestyle changes and strategies to help make lifestyle changes.    The patient may consider cognitive behavioral therapy for weight control in the pre-operative and post-operative intervals and was provided information about pursuing that intervention. This treatment would not be considered a prerequisite for bariatric surgery.     The patient was educated about pursuing treatment for ***. This would/would not be considered a prerequisite for bariatric surgery.    Please do not hesitate to contact me if you have any questions or concerns.    Sincerely,      Susan Goodwin, M.D.  Clinical Professor  [place] [facility]  Tel: [phone]  Fax: [phone]

## 2023-06-07 ENCOUNTER — Telehealth: Payer: Commercial Managed Care - HMO | Attending: Physician Assistant

## 2023-06-07 NOTE — Progress Notes (Signed)
I performed this consultation using real-time Telehealth tools, including a live video connection between my location and the patient's location. Prior to initiating the consultation, I obtained informed verbal consent to perform this consultation using Telehealth tools and answered all the questions about the Telehealth interaction.    HPI  I saw Susan Goodwin, DOB [birth year], today at the [facility], Bariatric Surgery Program: [facility].  She is here for documentation of non surgical weight loss attempts to prepare for bariatric surgery.    Diet-wise, trying to eat more vegetables like broccoli, salads, spinach. Eating small portions. Sometimes only eats 2x/day. Snacks: fruit, greek yogurt.    Hydration-wise, drinks 3-4 water bottles a day. No soda, juice or alcohol.    For physical activity, limited by chronic knee pain. Needs to lose weight for knee surgery.    She sees a cardiologist locally.    She has completed all of her pre-op labs: No, she has completed locally.  She has completed her ECG: No, she has completed locally.     She has completed her nutrition visit: Yes  She has completed her psych visit: Yes     PE  Vitals: Wt (!) 142.4 kg (314 lb)   BMI 57.43 kg/m   Constitutional: oriented to person, place, and time.    Assessment & Plan  1. Morbid obesity (CMS code)  - Will continue to attend our [place] [facility] for remaining non surgical weight loss documentation.  - Continue to increase protein intake and decrease carbohydrate and sugar intake  - Review educational guides  - Added to support group mailing list  - Mailed educational guides  - Eat at least 3 meals a day, increase hydration    2. Preop testing  - Follow up in 1 month to review pre-op requirements: labs, ecg, stress test  - PCP rec letter template faxed to PCP  and mailed to patient  - She will follow up with cardiologist to order stress test    3.  Osteoarthritis of both knees, unspecified osteoarthritis type  - chronic knee pain, needs to lose weight for knee surgery    APP Visit Information:     APP Service Type:  Independent  Available MD consultant:  Alroy Dust, MD    I spent a total of 40 non-overlapping minutes on this patient's care on the day of their visit excluding time spent related to any billed procedures. This time includes time spent with the patient as well as time spent documenting in the medical record, reviewing patient's records and tests, obtaining history, placing orders, communicating with other healthcare professionals, counseling the patient, family, or caregiver, and/or care coordination for the diagnoses above.        Lisbeth Renshaw, PA-C  [facility]: Bariatric Surgery         [facility]  [address]  [place], [place] [address]  Ph: [phone] (OPT-5)  Fax: [phone]

## 2023-07-19 ENCOUNTER — Telehealth: Payer: Commercial Managed Care - HMO | Attending: Physician Assistant

## 2023-07-19 NOTE — Progress Notes (Signed)
I performed this consultation using real-time Telehealth tools, including a live video connection between my location and the patient's location. Prior to initiating the consultation, I obtained informed verbal consent to perform this consultation using Telehealth tools and answered all the questions about the Telehealth interaction.    HPI  I saw Susan Goodwin, DOB March 04, 1961, today at the [facility], Bariatric Surgery Program: [facility].  She is here for documentation of non surgical weight loss attempts to prepare for bariatric surgery.    Diet-wise, she does not buy bread anymore.  Yesterday diet recall:  - Breakfast: spinach, mushrooms, green onions, eggs  - Lunch: chicken salad, lettuce  - Dinner: 1/4 [place] sandwich - cheese, ham, lettuce, tomatoes  Snacks: strawberries    Hydration-wise, drinks at least 4 water bottles a day.    She sees a cardiologist locally.      She has completed all of her pre-op labs: Missing some, she will complete locally.  She has completed her ECG: No, she will complete at her cardiologist.     She has completed her stress test: No, she will complete at her cardiologist.  Scheduled with cardiologist 08/05/23.  She has completed her nutrition visit: Yes  She has completed her psych visit: Yes     PE  Vitals: Wt (!) 141.1 kg (311 lb)   BMI 56.88 kg/m   Constitutional: oriented to person, place, and time.    Assessment & Plan  1. Morbid obesity (CMS code)  - Will continue to attend our [place] [facility] for remaining non surgical weight loss documentation.  - Continue to increase protein intake and decrease carbohydrate and sugar intake like bread, rice, tortillas    2. Preop testing  - Follow up in 1 month to review pre-op requirements: labs, ecg, stress test  - She will upload PCP letter in MyChart  - Will fax lab orders to PCP for her to complete locally  - She will follow up with cardiologist to  complete ecg and stress test  - Discussed pre-operative preparations (liquid diet, bowel prep, skin wash), hospital stay after surgery, post-operative recovery and diet    APP Visit Information:     APP Service Type:  Independent  Available MD consultant:  Alroy Dust, MD    I spent a total of 40 non-overlapping minutes on this patient's care on the day of their visit excluding time spent related to any billed procedures. This time includes time spent with the patient as well as time spent documenting in the medical record, reviewing patient's records and tests, obtaining history, placing orders, communicating with other healthcare professionals, counseling the patient, family, or caregiver, and/or care coordination for the diagnoses above.        Lisbeth Renshaw, PA-C  [facility]: Bariatric Surgery         [facility]  [address]  [place], [place] [address]  Ph: [phone] (OPT-5)  Fax: [phone]

## 2023-08-16 ENCOUNTER — Telehealth: Payer: Commercial Managed Care - HMO | Attending: Physician Assistant

## 2023-08-16 NOTE — Progress Notes (Signed)
I performed this consultation using real-time Telehealth tools, including a live video connection between my location and the patient's location. Prior to initiating the consultation, I obtained informed verbal consent to perform this consultation using Telehealth tools and answered all the questions about the Telehealth interaction.    HPI  I saw Susan Goodwin, DOB 1961/08/03, today at the [facility], Bariatric Surgery Program: [facility].  She is here for documentation of non surgical weight loss attempts to prepare for bariatric surgery.    Diet-wise, she has been trying to eat healthy.  Yesterday diet recall:  - Breakfast: spinach, bell peppers, onions, ham, omelette  - Lunch: greek yogurt, fruit  - Dinner: 2 tacos - steak, tripe    Hydration-wise, drinks at least 4 water bottles a day. No juice or soda. Drinks black coffee in the morning.    For physical activity, limited due to chronic knee and back pain. Uses walker to ambulate.    She has completed all of her pre-op labs: Missing some, she will complete locally. Reviewed results from 04/09/23-05/11/2023.  She has completed her ECG: No, she will complete at [facility].  She has completed her stress test: No, she will complete at [facility] instead of her local hospital.  She has completed her nutrition visit: Yes  She has completed her psych visit: Yes    PE  Vitals: Wt (!) 141.1 kg (311 lb)   BMI 56.88 kg/m   Constitutional: oriented to person, place, and time.    Assessment & Plan  1. Morbid obesity (CMS code)  - Will continue to attend our [place] [facility] for remaining non surgical weight loss documentation.  - Continue to increase protein intake and decrease carbohydrate and sugar intake like bread, rice, pasta, potatoes, tortillas    2. Preop testing  - Follow up in 1 month to review pre-op requirements: remaining labs, ecg, stress test  - Call central radiology to schedule stress  test    APP Visit Information:     APP Service Type:  Independent  Available MD consultant:  Alroy Dust, MD    I spent a total of 30 non-overlapping minutes on this patient's care on the day of their visit excluding time spent related to any billed procedures. This time includes time spent with the patient as well as time spent documenting in the medical record, reviewing patient's records and tests, obtaining history, placing orders, communicating with other healthcare professionals, counseling the patient, family, or caregiver, and/or care coordination for the diagnoses above.        Lisbeth Renshaw, PA-C  [facility]: Bariatric Surgery         [facility]  [address]  [place], [place] [address]  Ph: [phone] (OPT-5)  Fax: [phone]

## 2023-08-25 IMAGING — MR RM - PUNHO - DIREITO
4 of 5 series · 19 of 40 positions shown · non-contrast
Comparison: none

[Series 5: cor dp fs · coronal · 2.5mm · 0.27mm/px · 3 of 16 slices shown]
[im 4/16]
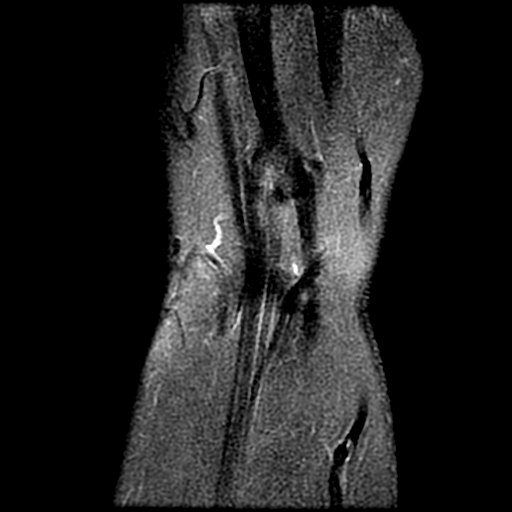
[im 10/16]
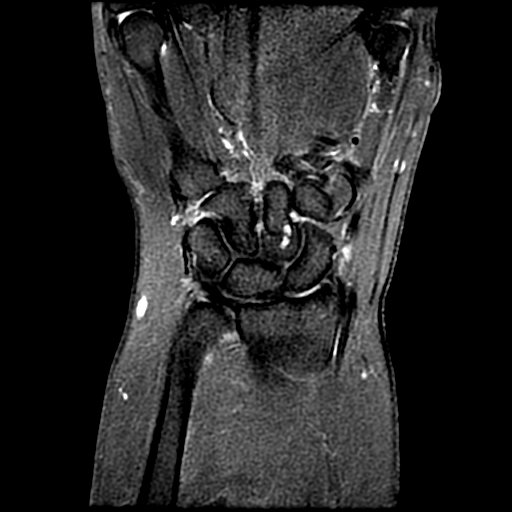
[im 16/16]
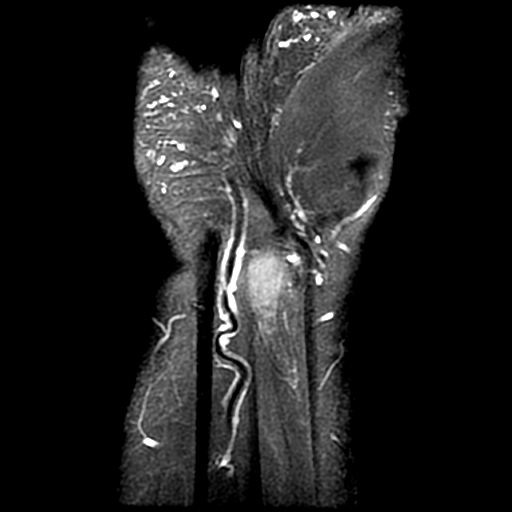

[Series 6: T1 · coronal · 2.5mm · 0.27mm/px · 6 of 16 slices shown (1 of 2)]
[im 1/16]
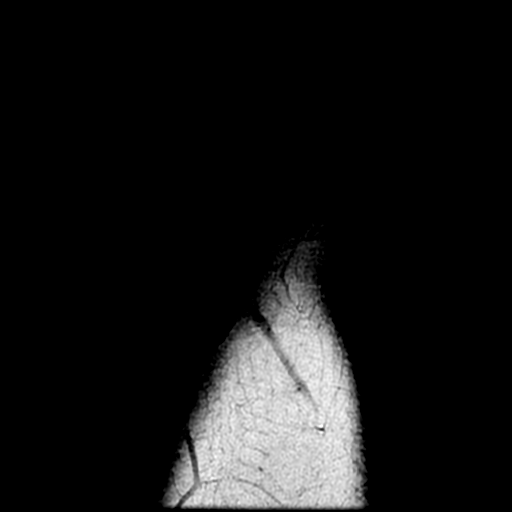
[im 4/16]
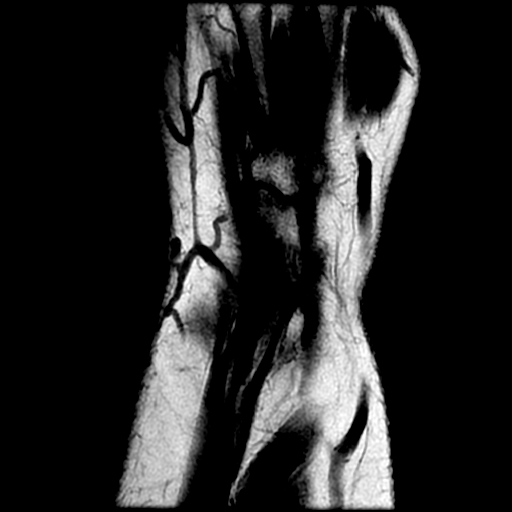
[im 7/16]
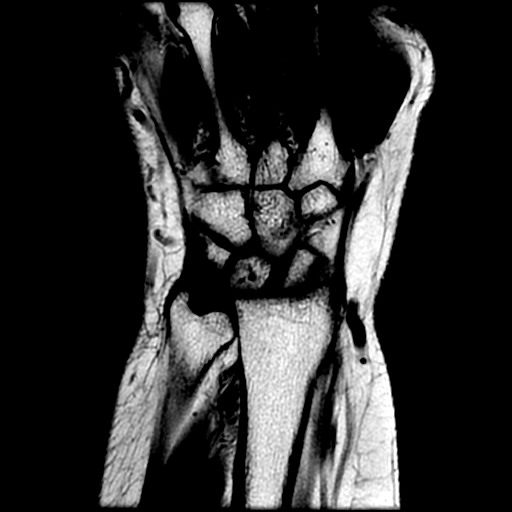
[im 10/16]
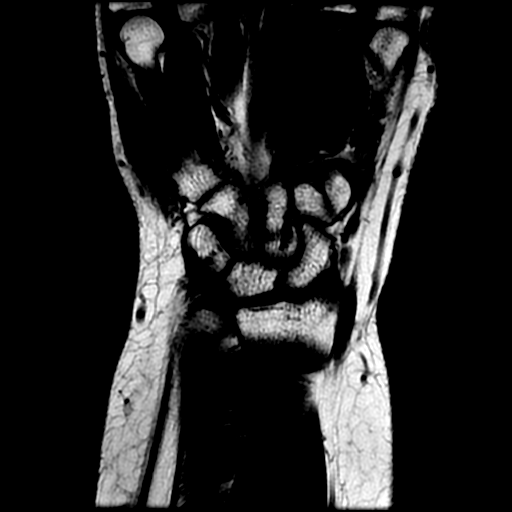
[im 13/16]
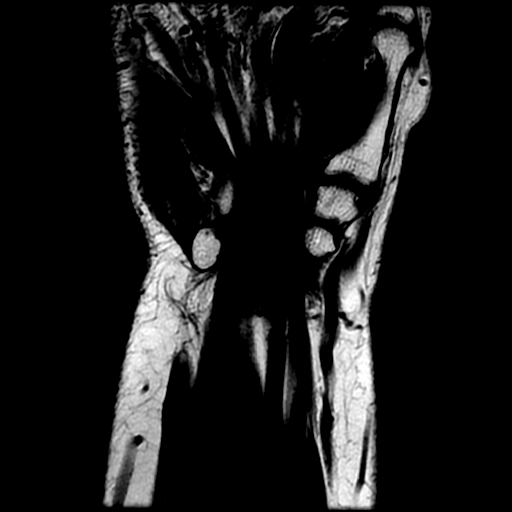
[im 16/16]
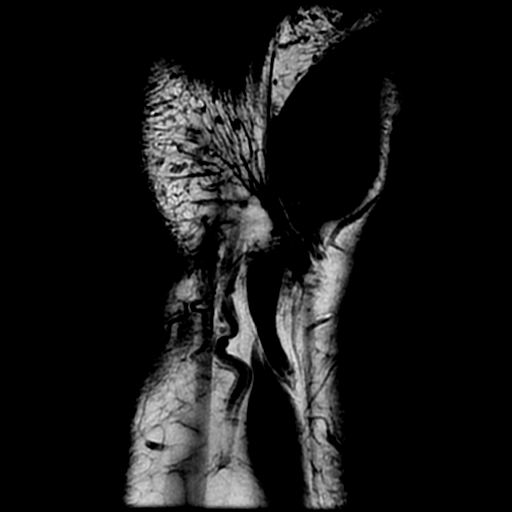

[Series 7: sag dp fs · oblique · 2.8mm · 0.25mm/px · 3 of 22 slices shown]
[im 4/22]
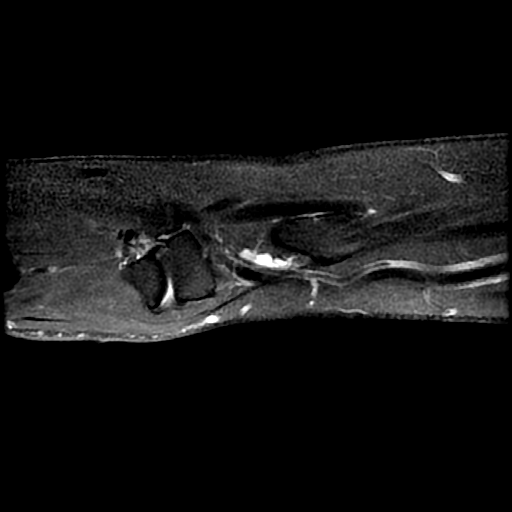
[im 13/22]
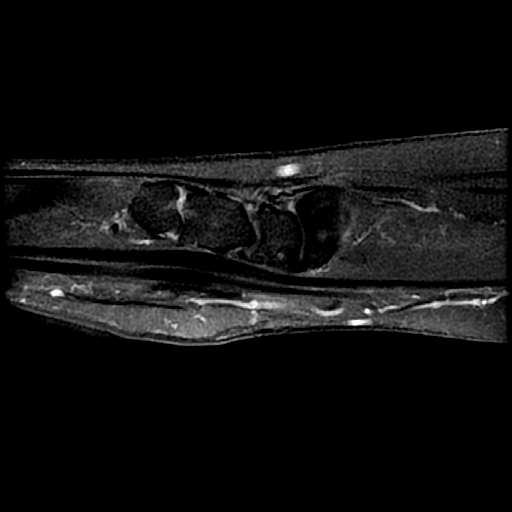
[im 19/22]
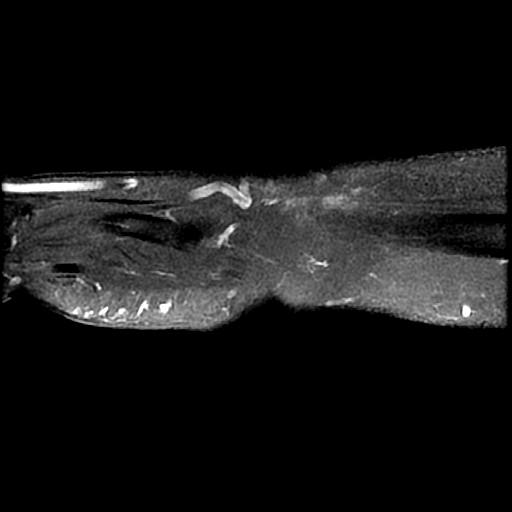

[Series 9: T1 · oblique · 2.3mm · 0.21mm/px · 7 of 25 slices shown (2 of 2)]
[im 1/25]
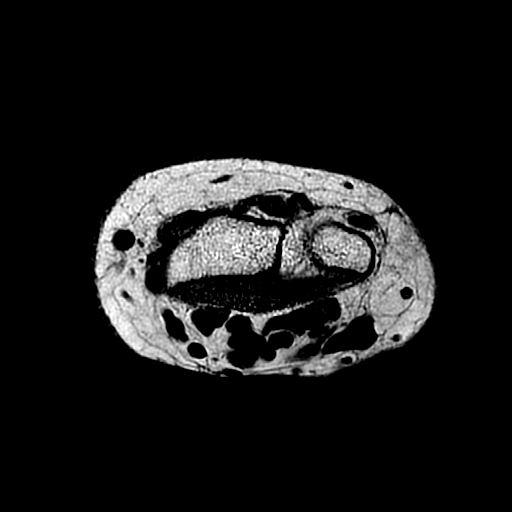
[im 3/25]
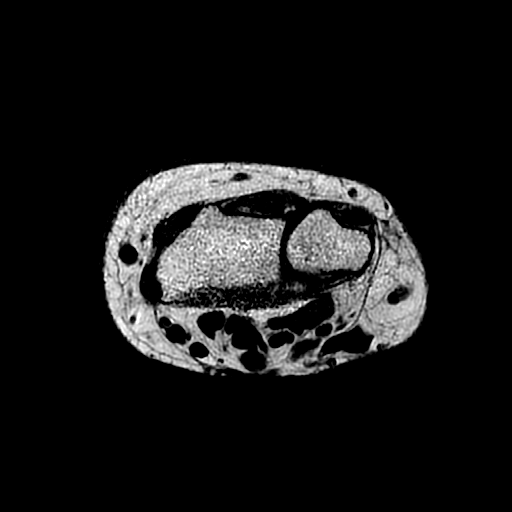
[im 9/25]
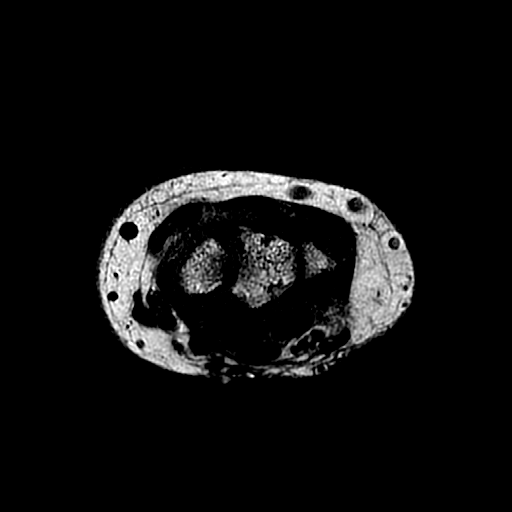
[im 11/25]
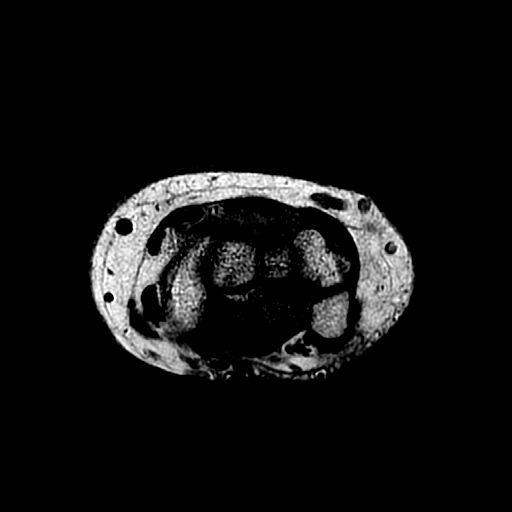
[im 14/25]
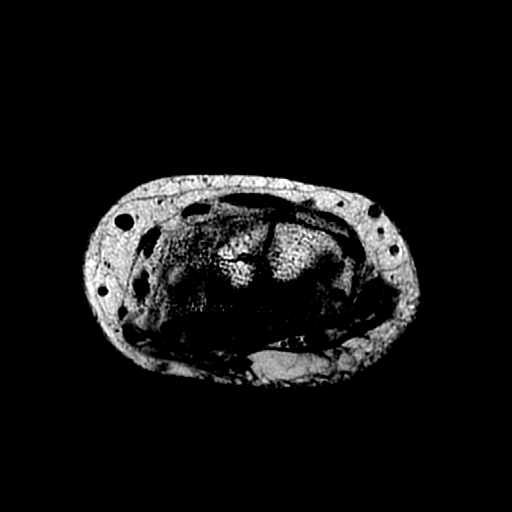
[im 17/25]
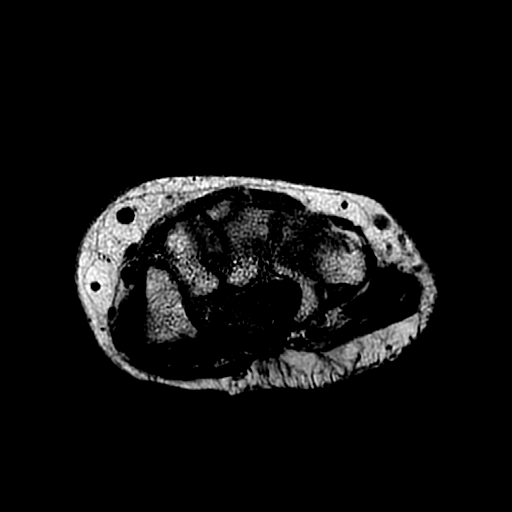
[im 22/25]
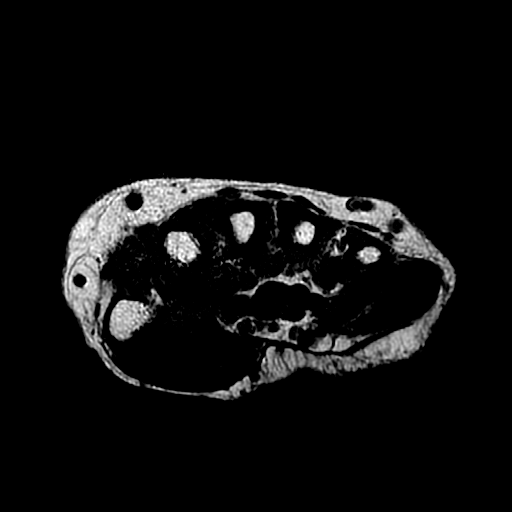

[19 of 40 positions shown; findings below may reference images not displayed]

Técnica:
Foram realizadas sequências multiplanares com ponderações em T1 e T2/DP, com e sem supressão do sinal
da gordura.
Relatório:
RESSONÂNCIA MAGNÉTICA DO PUNHO DIREITO
Estruturas ósseas com aspecto habitual e alinhamento dos ossos do carpo preservado.
Espaço escafolunato mantido.
Ligamentos intrínsecos escafolunato e lunopiramidal íntegros.
Afilamento irregular condral difuso nos compartimentos radiocarpal e intercarpal.
Algumas formações císticas subcorticais.
Presença de leve rizartrose.
Afilamento irregular condral com osteófitos marginais nas articulações carpometacarpianas.
Pequeno derrame articular intercarpal e nas articulações carpometacarpianas.
Complexo da fibrocartilagem triangular sem alterações.
Tendões flexores e extensores com espessura e intensidade de sinal normais, exceto:
Tendinopatia e tenossinovite do sexto compartimento extensor, sem sinais de roturas.
Espessamento do nervo mediano ao nível do túnel do carpo.
Feixes vasculares sem alterações.
Ausência de formações expansivas sólidas.
Impressão:
Alterações degenerativas difusas no punho, incluindo afilamento condral e formações císticas subcorticais nos
compartimentos radiocarpal e intercarpal.
Leve rizartrose e alterações degenerativas nas articulações carpometacarpianas.
Pequeno derrame articular intercarpal e carpometacarpiano.
Tendinopatia e tenossinovite do sexto compartimento extensor, sem evidências de roturas.
Espessamento do nervo mediano ao nível do túnel do carpo, sugerindo correlação com dados clínicos e
eletroneuromiografia.

## 2023-08-25 IMAGING — MR RM - OMBRO - ESQUERDO
4 of 6 series · 21 of 40 positions shown · non-contrast
Comparison: none

[Series 1: loc axial · axial · 5.0mm · 0.94mm/px · z∈[-38,+38]mm · 3 of 12 slices shown]
[im 1/12]
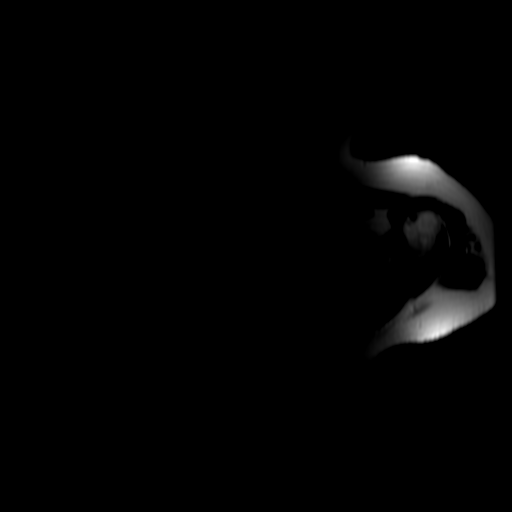
[im 8/12]
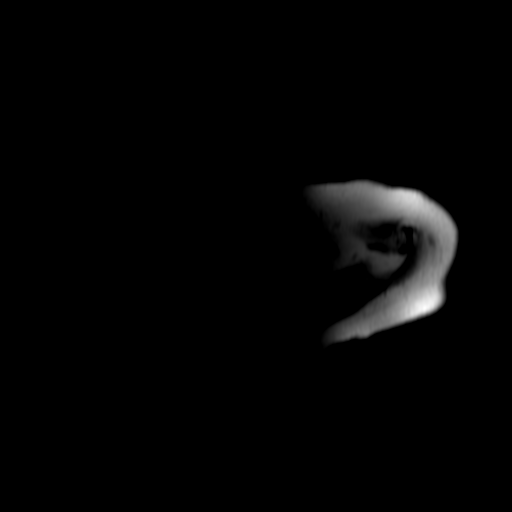
[im 12/12]
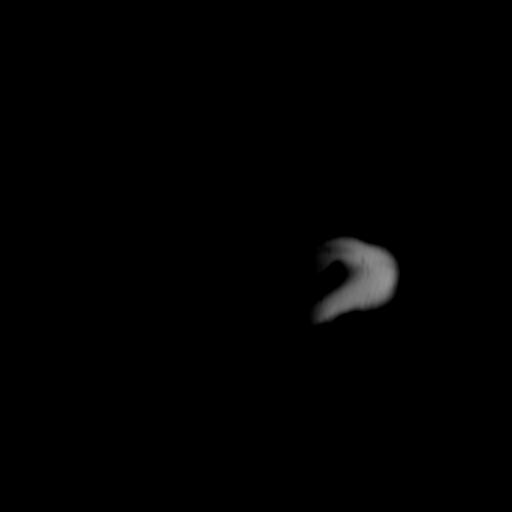

[Series 2: 2-plane loc · oblique · 5.0mm · 0.78mm/px · 3 of 14 slices shown]
[im 1/14]
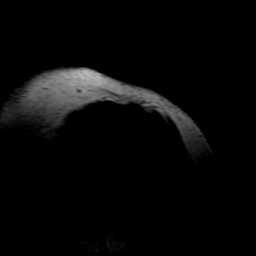
[im 9/14]
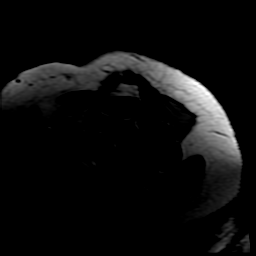
[im 14/14]
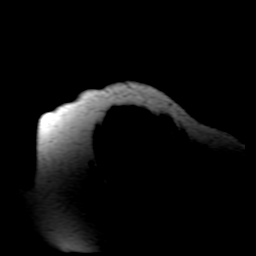

[Series 3: T2 fat-sat · oblique · 3.5mm · 0.31mm/px · 8 of 20 slices shown]
[im 1/20]
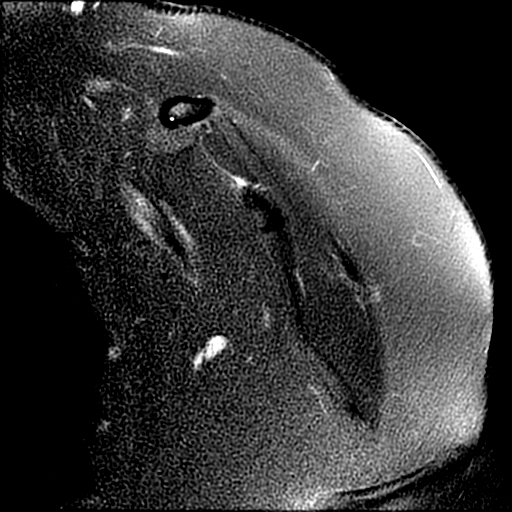
[im 3/20]
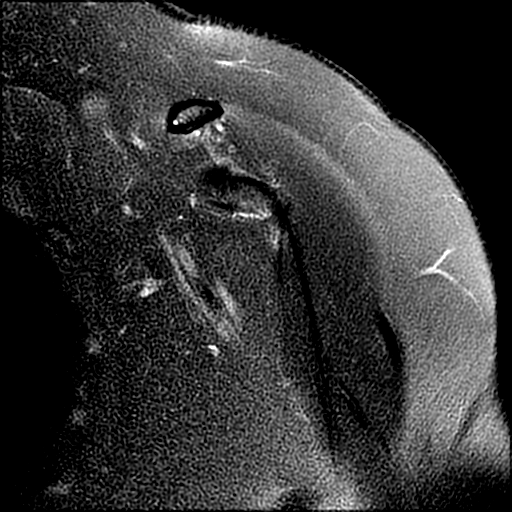
[im 6/20]
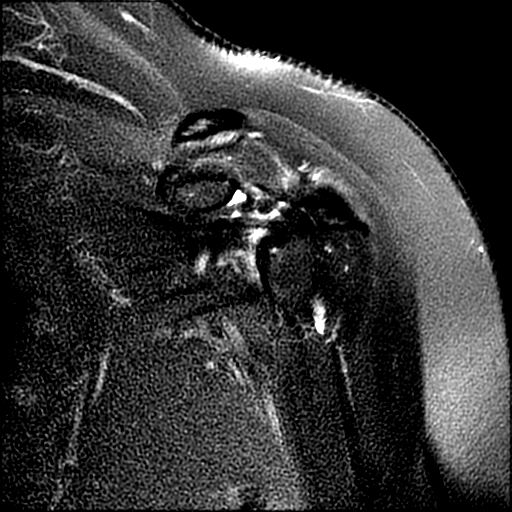
[im 9/20]
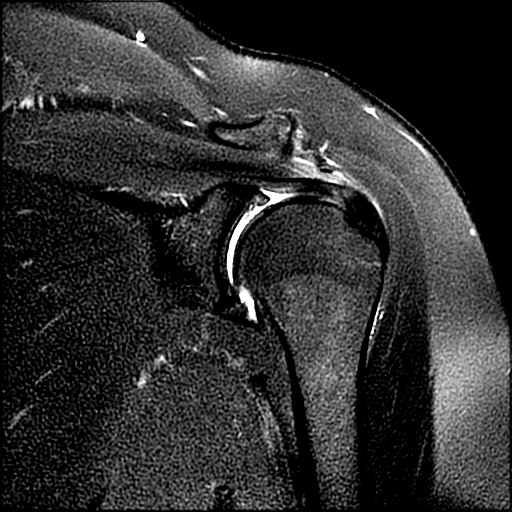
[im 11/20]
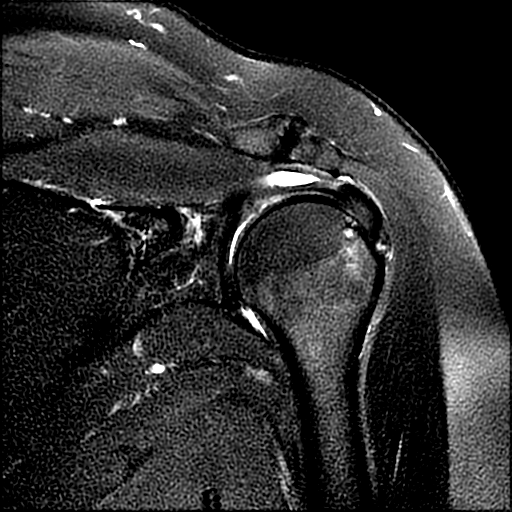
[im 14/20]
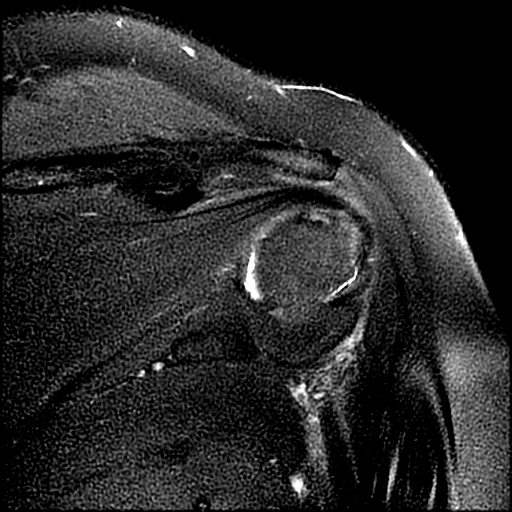
[im 17/20]
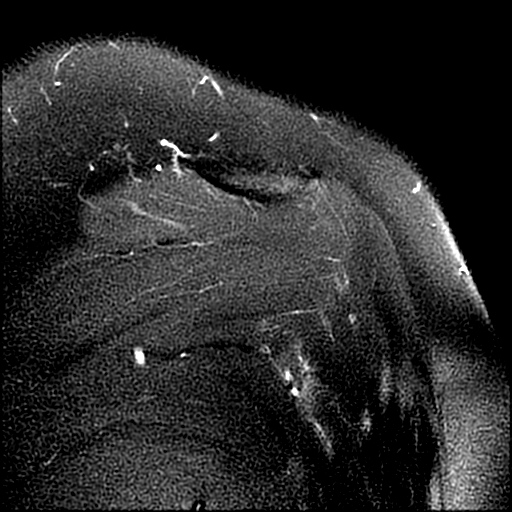
[im 20/20]
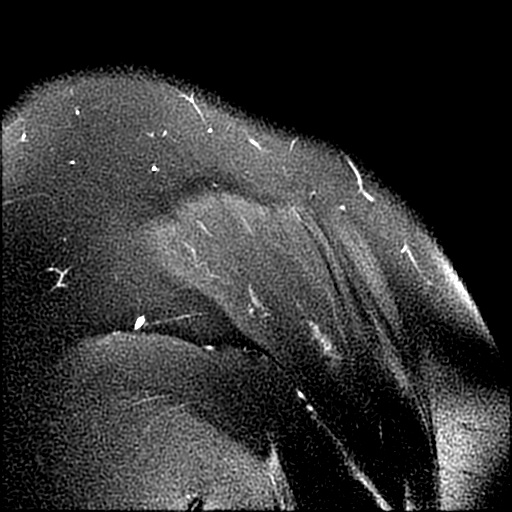

[Series 5: T1 · oblique · 3.5mm · 0.31mm/px · 7 of 20 slices shown]
[im 1/20]
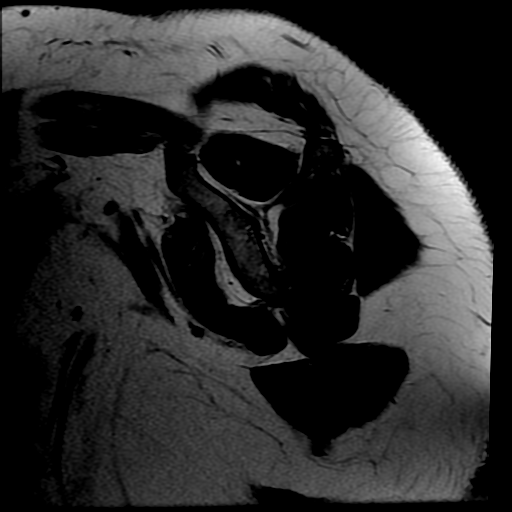
[im 3/20]
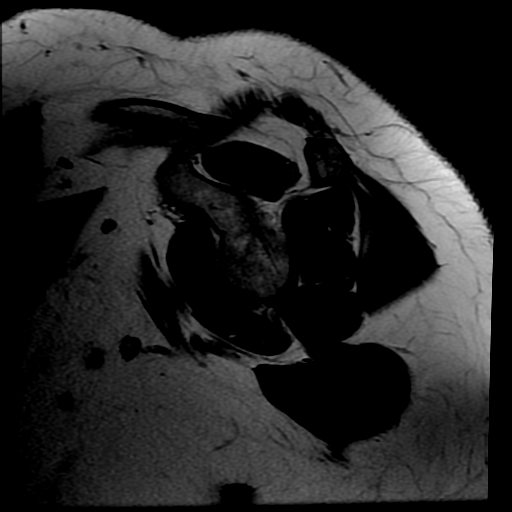
[im 6/20]
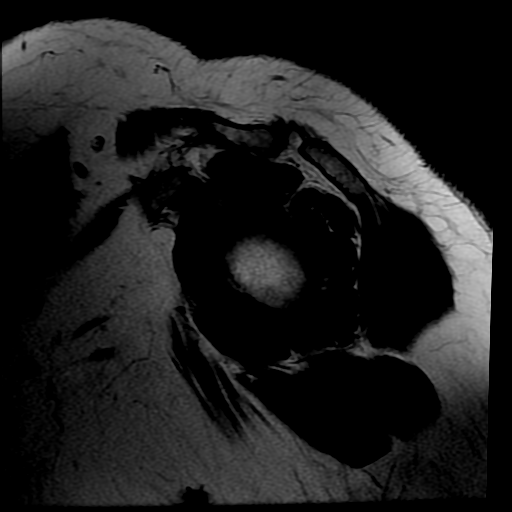
[im 9/20]
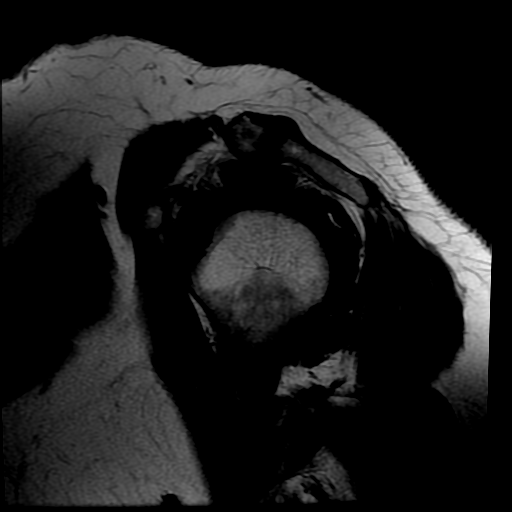
[im 11/20]
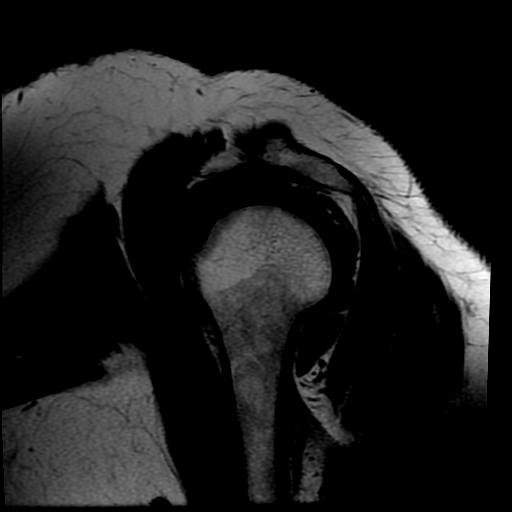
[im 14/20]
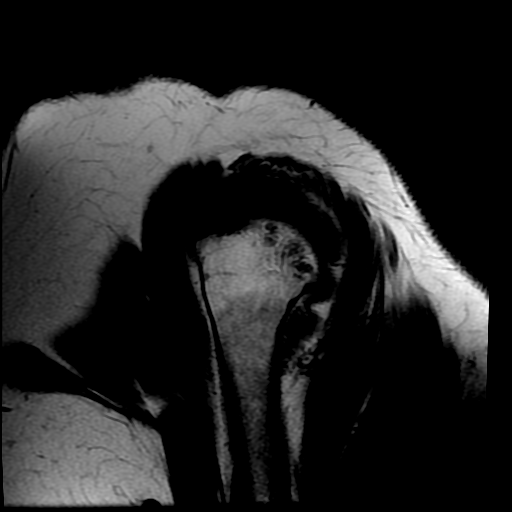
[im 17/20]
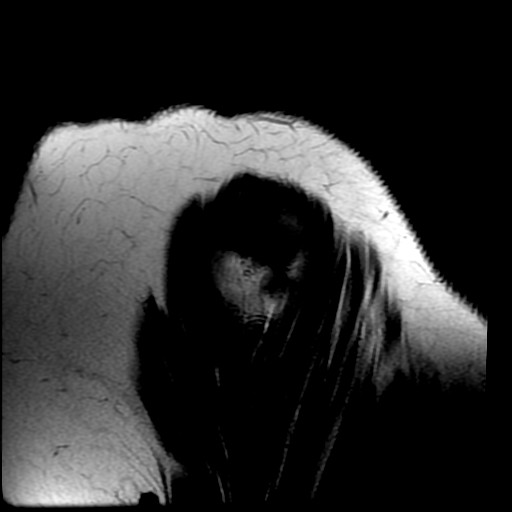

[21 of 40 positions shown; findings below may reference images not displayed]

Técnica:
Exame realizado com sequências multiplanares ponderadas em T1, T2/DP e T2 com supressão de gordura.
Relatório:
 Tendinopatia moderada dos tendões supraespinhal e infraespinhal, com fissuras/degeneração
intrassubstanciais predominando na transição entre os tendões.
RESSONÂNCIA MAGNÉTICA DO OMBRO ESQUERDO
Roturas parciais intrassubstanciais de baixo grau, acometendo cerca de 15% da espessura tendínea nas fibras
anteriores do supraespinhal, com extensão de 0,7 cm e retração focal de 0,6 cm, sem transfixação.
Tendinopatia das fibras superiores do subescapular, com fissuras/degeneração intrassubstancial sem sinais de
roturas.
Leve tendinopatia do segmento articular e deflexão do cabo longo do bíceps, associado a pequena distensão
líquida de sua bainha.
Moderada bursite subacromial subdeltoidea com distensão líquida.
Discreta elevação do sinal intrínseco do lábio glenoidal predominando em seus contornos superiores, com
irregularidade de seus contornos, de origem degenerativa.
Alterações degenerativas acromioclaviculares com osteófitos marginais, destacando discreto edema de ambos
os componentes, de provável natureza mecânica.
Pequeno derrame articular glenoumeral.
Edema da medular óssea e formações císticas subcorticais nas tuberosidades umerais, predominando na
maior.
Ventres musculares eutróficos.
Impressão:
Tendinopatia moderada dos tendões supraespinhal e infraespinhal, com roturas parciais intrassubstanciais de
baixo grau no supraespinhal.
Tendinopatia das fibras superiores do subescapular.
Leve tendinopatia do cabo longo do bíceps com distensão líquida da bainha.
Moderada bursite subacromial subdeltoidea.
Alterações degenerativas do lábio glenoidal e da articulação acromioclavicular.
Pequeno derrame articular glenoumeral.
Edema da medular óssea e cistos subcorticais nas tuberosidades umerais.

## 2023-08-25 IMAGING — MR e+1 RM - PUNHO - ESQUERDO
4 of 5 series · 19 of 40 positions shown · non-contrast
Comparison: none

[Series 6: cor dp fs · coronal · 2.5mm · 0.27mm/px · 3 of 16 slices shown]
[im 4/16]
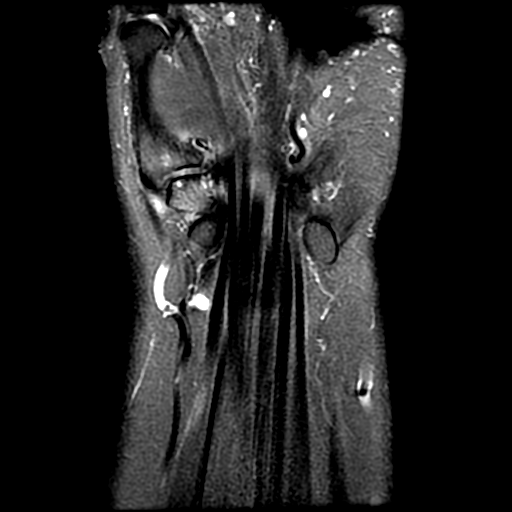
[im 10/16]
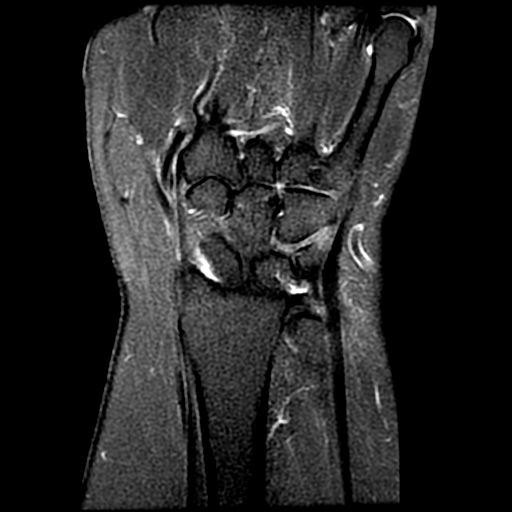
[im 16/16]
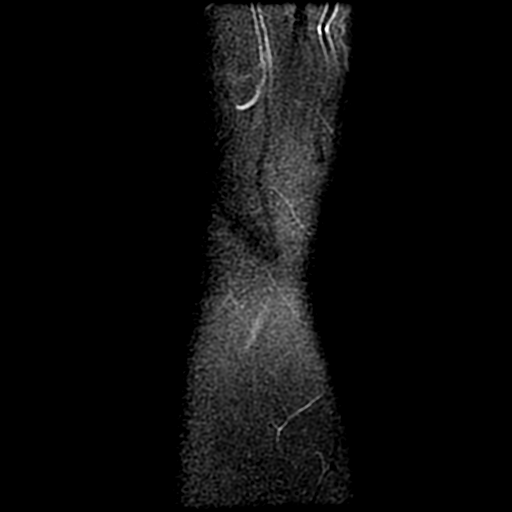

[Series 7: T1 · coronal · 2.5mm · 0.27mm/px · 6 of 16 slices shown (1 of 2)]
[im 1/16]
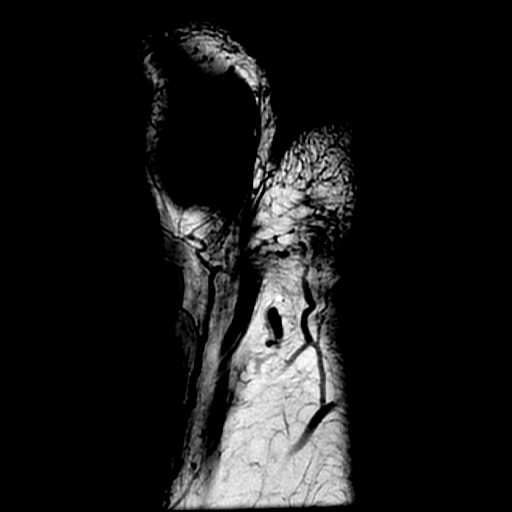
[im 4/16]
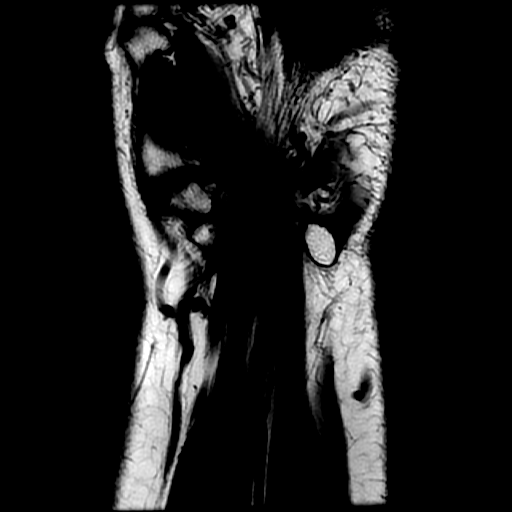
[im 7/16]
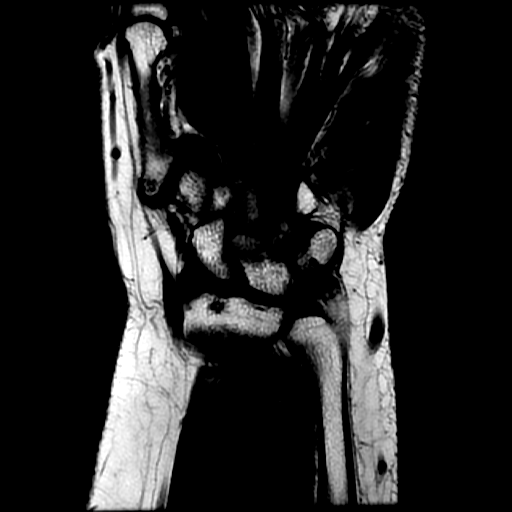
[im 10/16]
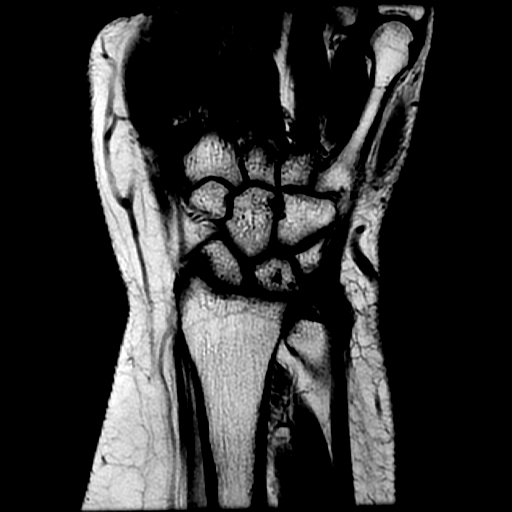
[im 13/16]
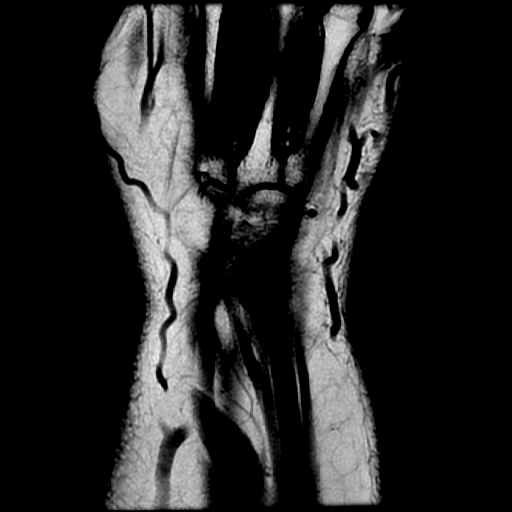
[im 16/16]
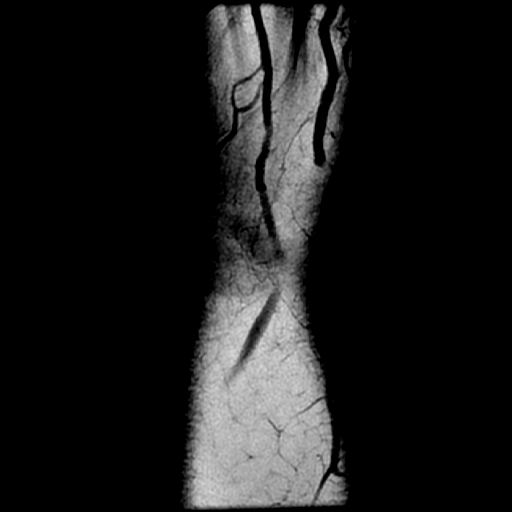

[Series 8: sag dp fs · oblique · 2.8mm · 0.25mm/px · 3 of 22 slices shown]
[im 4/22]
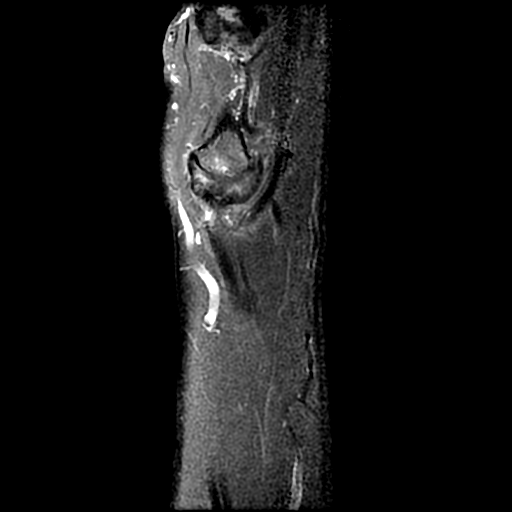
[im 13/22]
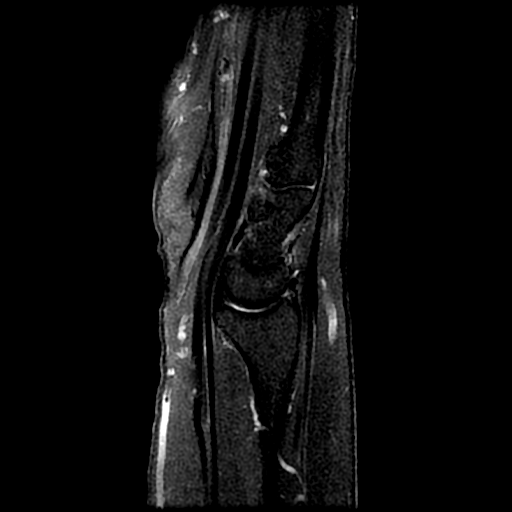
[im 19/22]
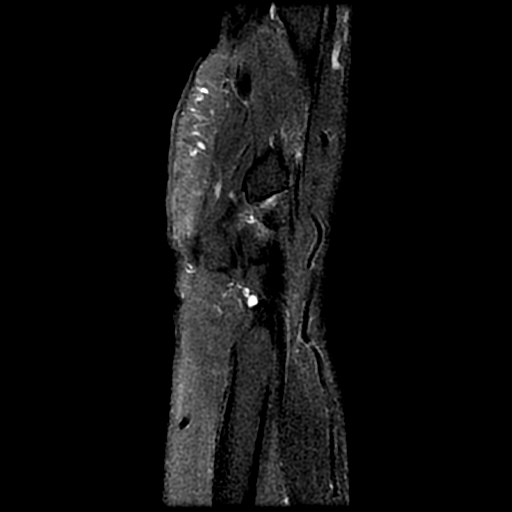

[Series 10: T1 · oblique · 2.3mm · 0.21mm/px · 7 of 25 slices shown (2 of 2)]
[im 1/25]
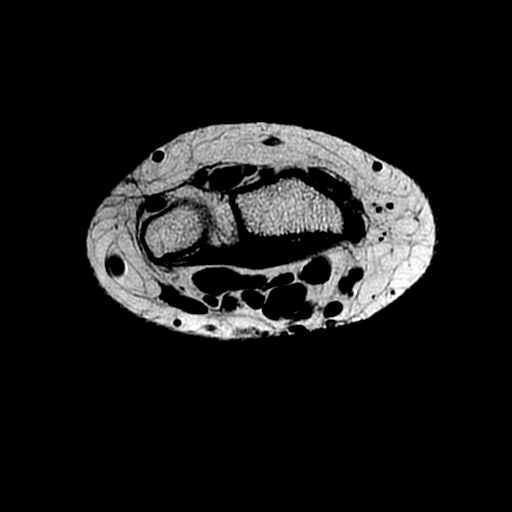
[im 3/25]
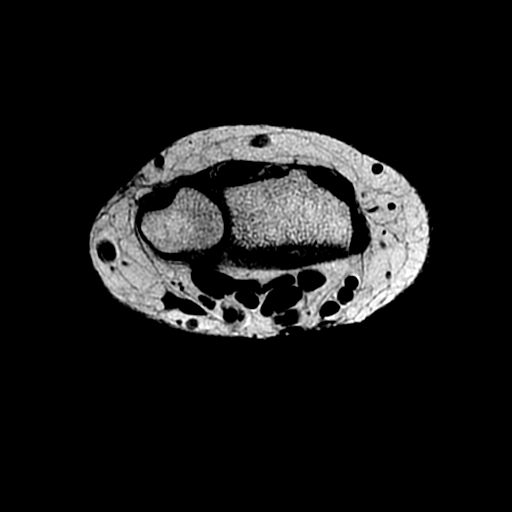
[im 9/25]
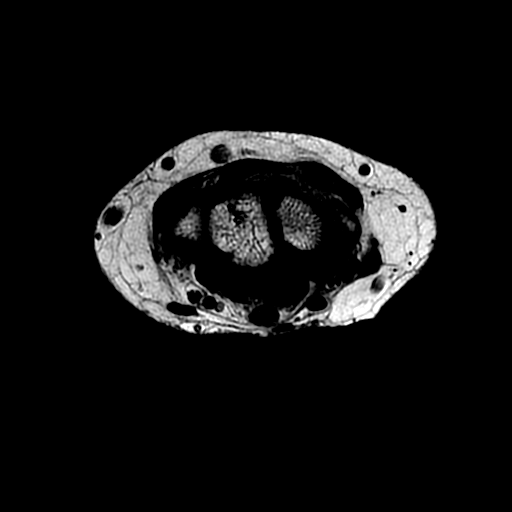
[im 11/25]
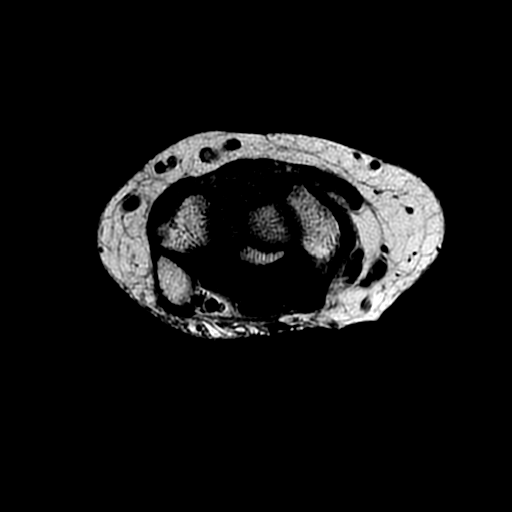
[im 14/25]
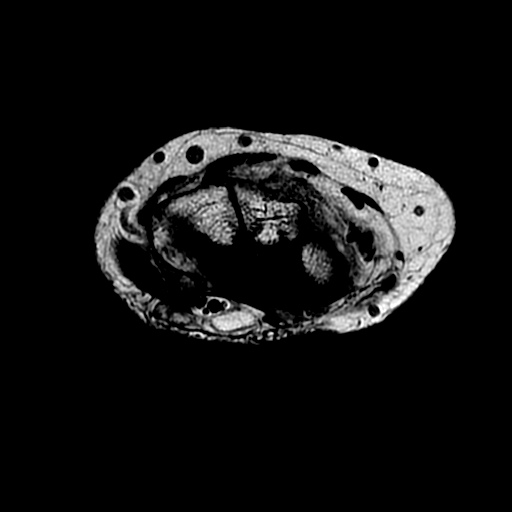
[im 17/25]
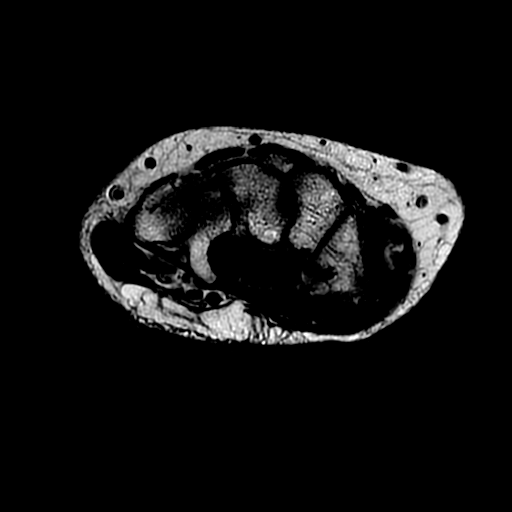
[im 22/25]
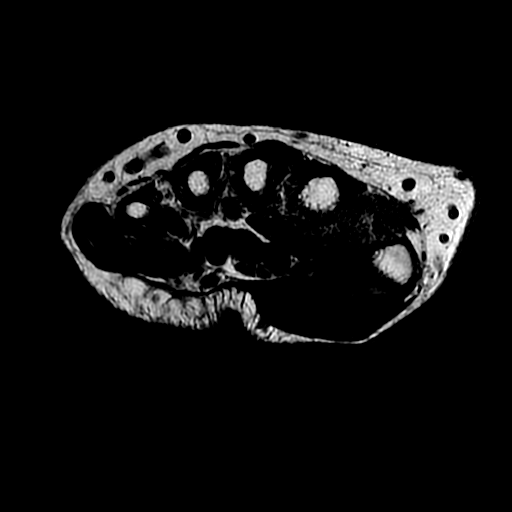

[19 of 40 positions shown; findings below may reference images not displayed]

Técnica:
Foram realizadas sequências multiplanares com ponderações em T1 e T2/DP, com e sem supressão do sinal
da gordura.
Relatório:
Estruturas ósseas com aspecto habitual.
RESSONÂNCIA MAGNÉTICA DO PUNHO ESQUERDO
O alinhamento dos ossos do carpo está preservado.
Espaço escafolunato mantido.
Ligamentos intrínsecos escafolunato e lunopiramidal íntegros.
Discreto afilamento irregular condral radiocarpal e intercarpal com diminutas formações císticas subcorticais.
Acentuada risartrose com afilamento irregular condral, formações císticas subcorticais, osteófitos marginais e
edema de ambos os componentes.
Complexo da fibrocartilagem triangular sem alterações.
Tendões flexores e extensores com espessura e intensidade de sinal normais.
Discreto espessamento do nervo mediano ao nível do túnel do carpo.
Os feixes vasculares não apresentam alterações.
Ausência de formações expansivas sólidas.
Ausência de derrame articular ou sinovite.
Impressão:
Discreto afilamento irregular condral radiocarpal e intercarpal com diminutas formações císticas subcorticais.
Acentuada risartrose com alterações degenerativas.
Discreto espessamento do nervo mediano ao nível do túnel do carpo, sugerindo correlação com dados clínicos
e eletroneuromiografia.

## 2023-08-27 IMAGING — MR RM - CRANIO/ANGIOS
9 of 16 series · 19 of 48 positions shown · non-contrast
Comparison: none

------------- REPORT [id number] -------------
Method:
Cranial magnetic resonance imaging study performed with FSE technique, with T1 and T2 weighted sequences, before and after intravenous infusion of paramagnetic contrast medium.
Diffusion-weighted echo-planar sequence performed.
Analysis:
Nodular thickening of the pachymeningeal lining adjacent to the left inferior frontal gyrus measuring 0.6 cm, of probable meningothelial etiology. Meningeal thickening associated with desmoplastic/lesional extension by contiguity.
Rare foci of hypersignal in T2 and FLAIR in the white matter, possibly representing foci of gliosis.
Remaining brain parenchyma with preserved morphology and signal intensity.
No signs of acute / subacute early ischemia in the diffusion sequence.
Encephalic sulci and fissures of usual appearance.
Ventricular system with normal morphology and dimensions.
Main intracranial arterial trunks with preserved flow signal according to spin Echo criteria.
Diagnostic Impression:
Nodular thickening of the pachymeningeal lining adjacent to the left inferior frontal gyrus, of probable meningothelial etiology.
No significant evolutionary changes were characterized in relation to the 16/10/22 study, considering the protocol differences between the exams.

------------- REPORT [id number] -------------
TECHNIQUE: Exam performed on a 1.5 Tesla equipment, with angiographic sequence and subsequent multiplanar and three-dimensional reformations. The images of intracranial arterial magnetic resonance angiography were obtained with the NB-GOK technique.
Report:
INTRACRANIAL ARTERIAL MAGNETIC RESONANCE ANGIOGRAPHY
Visualized segments of the internal carotid arteries do not show significant changes.
The anterior, middle, and posterior cerebral arteries have normal caliber and flow signal, noting only a fetal origin pattern of the right posterior cerebral artery.
There is no evidence of significant occlusions or stenoses in the intradural segments of the vertebral arteries and basilar artery, with hypoplasia of the right vertebral artery.
There is no evidence of arteriovenous malformations or aneurysms in the large arterial branches of the vertebrobasilar and intracranial carotid systems, emphasizing the limitation of the method for the detection of small aneurysms, with digital angiography using the subtraction technique being the "gold standard" method for this purpose.

[Series 3: FLAIR · sagittal · 5.0mm · 0.49mm/px · 1 of 22 slices shown (1 of 2)]
[im 1/22]
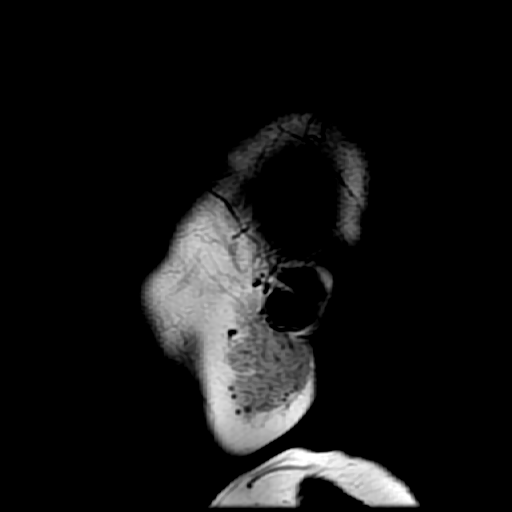

[Series 4: FLAIR · axial · 5.0mm · 0.47mm/px · 1 of 24 slices shown (2 of 2)]
[im 1/24]
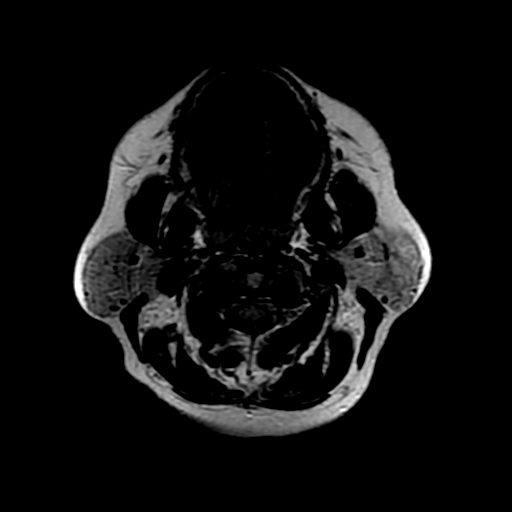

[Series 5: T2 · axial · 5.0mm · 0.47mm/px · 1 of 24 slices shown (1 of 2)]
[im 1/24]
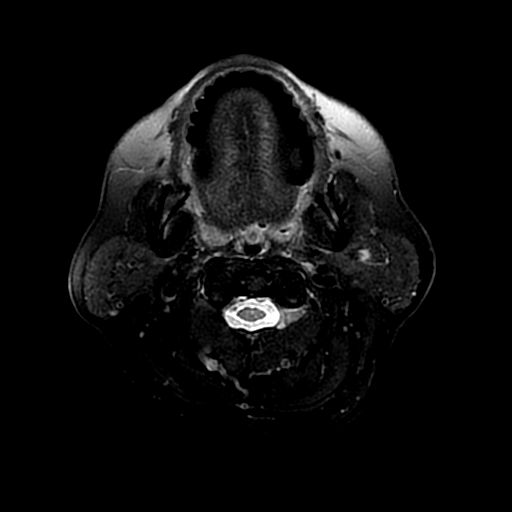

[Series 6: ax difusao · axial · 5.0mm · 0.94mm/px · 1 of 48 slices shown]
[im 1/48]
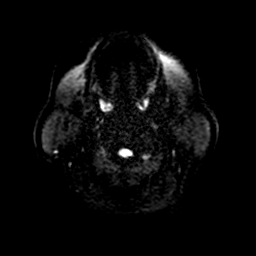

[Series 8: T2 · coronal · 4.5mm · 0.47mm/px · 1 of 24 slices shown (2 of 2)]
[im 1/24]
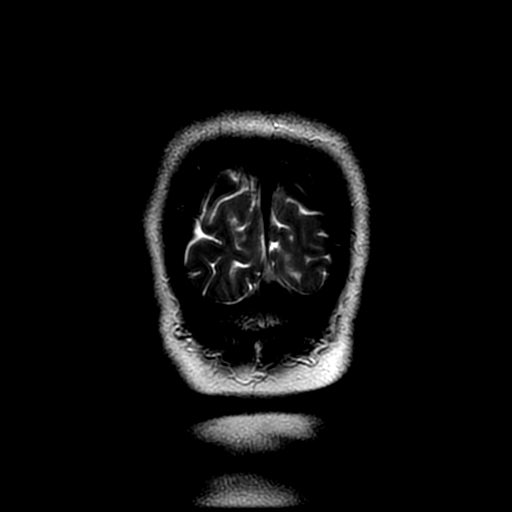

[Series 9: T2-star · axial · 5.0mm · 0.47mm/px · 1 of 24 slices shown]
[im 1/24]
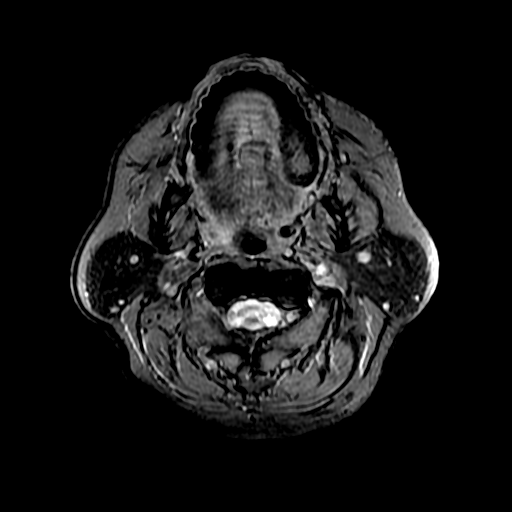

[Series 650: ADC · axial · 5.0mm · 0.94mm/px · 1 of 24 slices shown]
[im 1/24]
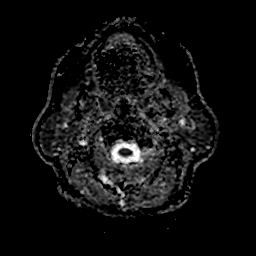

[Series 1000: angio venosa gd · axial · 1.4mm · 0.47mm/px · z∈[-42,+131]mm · 7 of 248 slices shown]
[im 1/248]
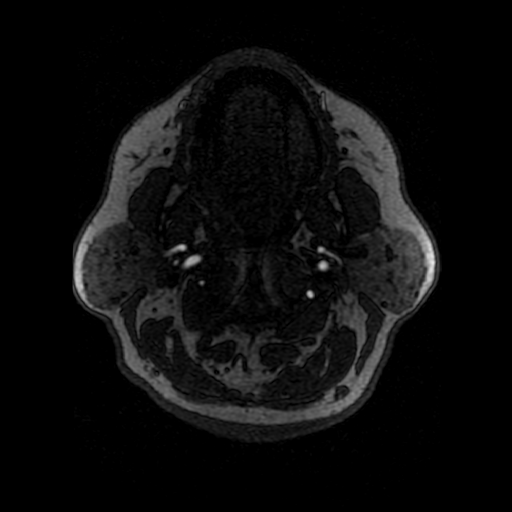
[im 42/248]
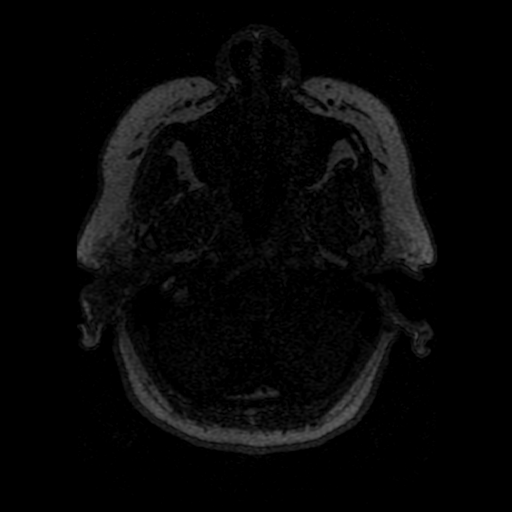
[im 83/248]
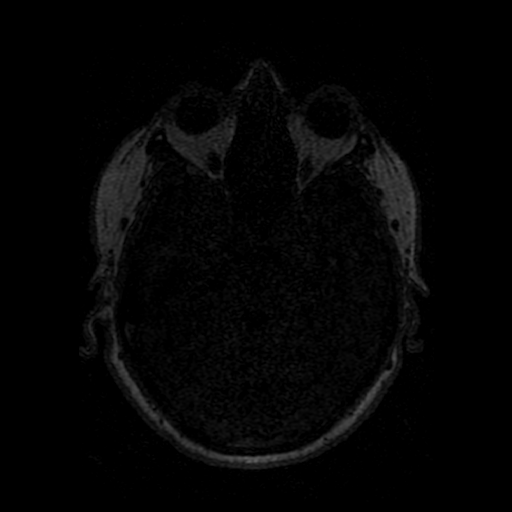
[im 124/248]
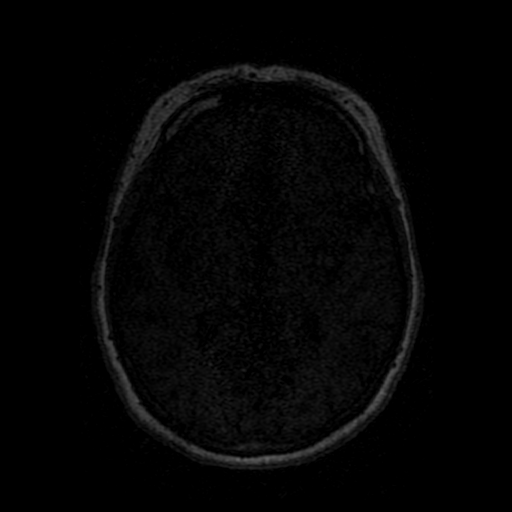
[im 165/248]
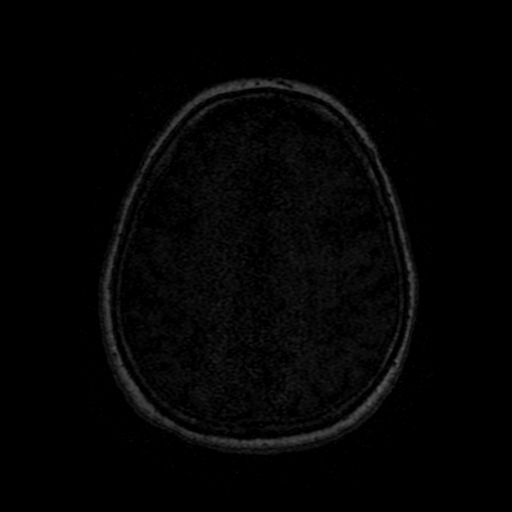
[im 206/248]
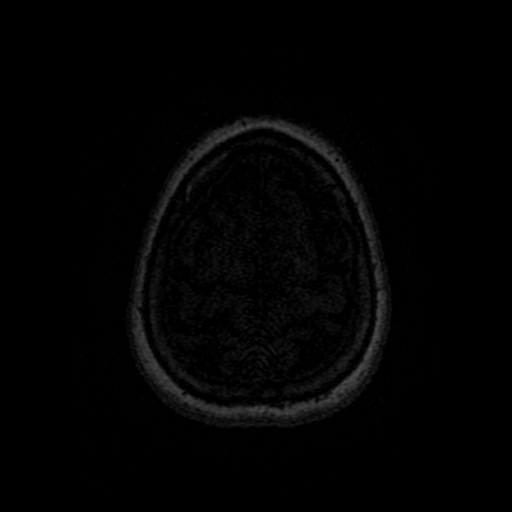
[im 248/248]
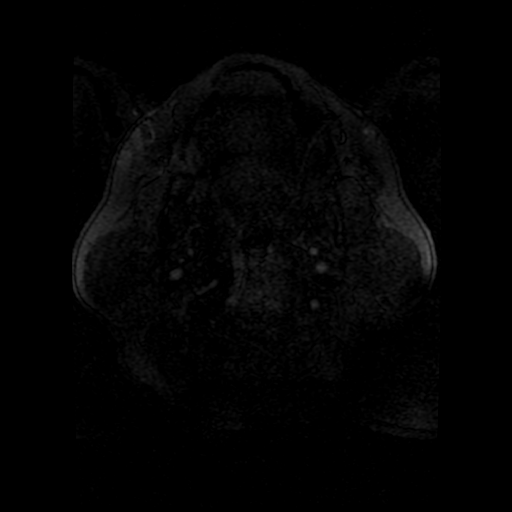

[Series 1001: ph1/angio venosa gd · axial · 1.4mm · 0.47mm/px · z∈[-42,+73]mm · 5 of 248 slices shown]
[im 1/248]
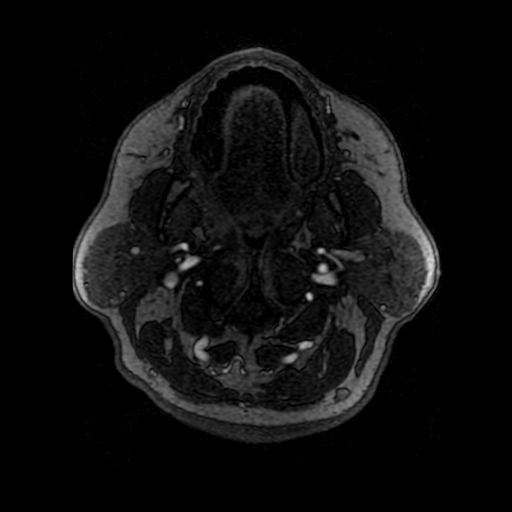
[im 42/248]
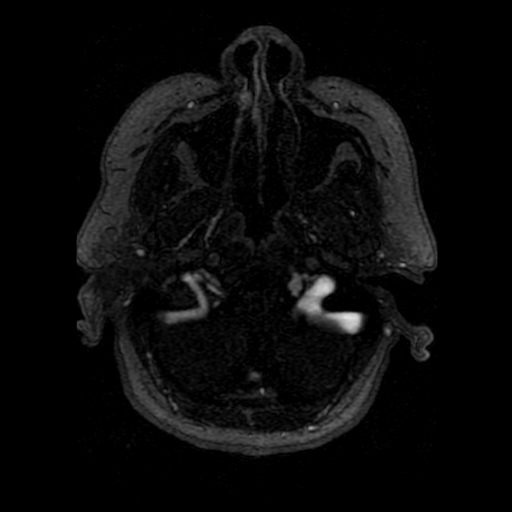
[im 83/248]
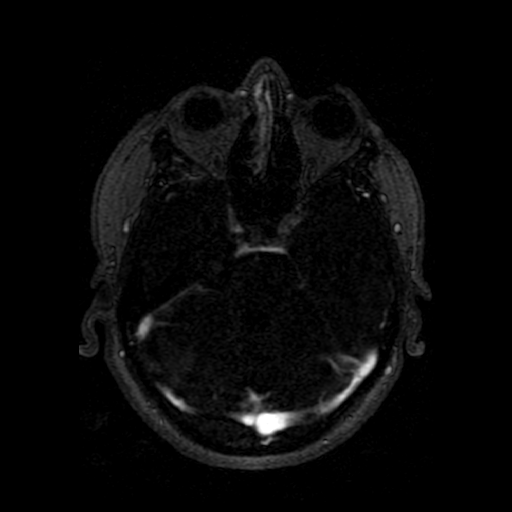
[im 124/248]
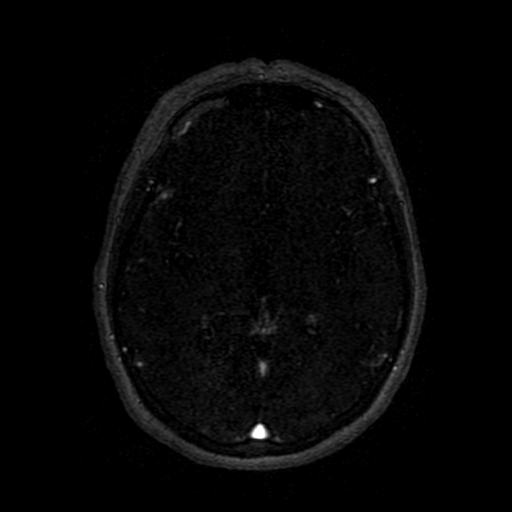
[im 165/248]
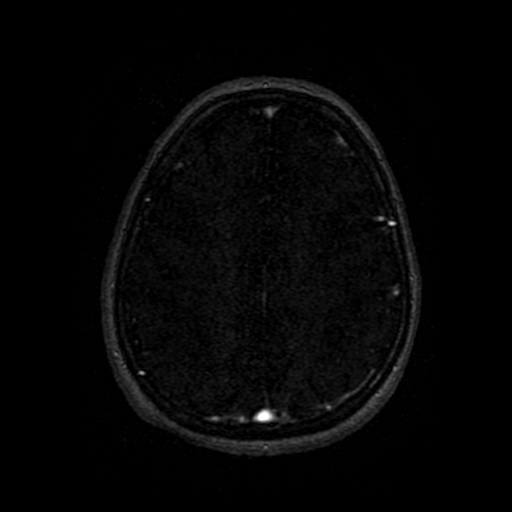

[19 of 48 positions shown; findings below may reference images not displayed]

IMPRESSION: Intracranial arterial magnetic resonance angiography study within normal limits.


------------- REPORT [id number] -------------
METHODOLOGY:
Intracranial venous MR angiography images were obtained after intravenous administration of the
paramagnetic contrast agent.
INTRACRANIAL VENOUS MR ANGIOGRAPHY
ANALYSIS:
Hypoplasia of the right transverse and sigmoid sinuses.
Prominent arachnoid granulation in the proximal third of the left transverse sinus.
Other dural venous sinuses with normal caliber and impregnation.
Superficial and deep venous system without abnormalities.
We did not find signs of intracranial venous thrombosis.
IMPRESSION: Intracranial venous MR angiography study within normal limits.

## 2023-09-14 ENCOUNTER — Telehealth: Payer: Commercial Managed Care - HMO | Attending: Physician Assistant

## 2023-09-14 NOTE — Progress Notes (Signed)
Failed Video Connection:  Due to failed video connection, I obtained consent from the patient to conduct this visit by telephone only. I spent a total of 13 minutes in audio communication with this patient.    HPI  I saw Susan Goodwin, DOB 05/27/1961, today at the [facility] B[facility]ric Surgery [facility]: [facility].  She is here for documentation of non surgical weight loss attempts to prepare for bariatric surgery.    Diet-wise,  Yesterday diet recall:  - Breakfast: vegetable omelette - green onions, spinach, mushrooms  - Snack: pear  - Lunch: [place] sandwich   - Dinner: ground beef, vegetables    Hydration-wise, drinks 4-5 water bottles a day.    For physical activity, limited due to chronic knee and back pain. Uses walker to ambulate.     She has completed all of her pre-op labs: Missing some, she will complete at [facility]. Reviewed results from 04/09/23-05/11/2023.  She has completed her ECG: No, she will complete at [facility]  She has completed her stress test: Scheduled 09/22/23 at [facility]   She has completed her nutrition visit: Yes  She has completed her psych visit: Yes    PE  Vitals: Wt (!) 143.8 kg (317 lb)   BMI 57.98 kg/m   Constitutional: oriented to person, place, and time.    Assessment & Plan  1. Morbid obesity (CMS code)  - Will continue to attend our [place] [facility] for remaining non surgical weight loss documentation.  - Continue to increase protein intake and decrease carbohydrate and sugar intake like bread, rice, pasta, potatoes, tortillas    2. Preop testing  - Follow up in 1 month to review pre-op requirements: labs, stress test    APP Visit Information:     APP Service Type:  Independent  Available MD consultant:  Alroy Dust, MD    I spent a total of 15 non-overlapping minutes on this patient's care on the day of their visit excluding time spent related to any billed procedures. This time includes time spent  with the patient as well as time spent documenting in the medical record, reviewing patient's records and tests, obtaining history, placing orders, communicating with other healthcare professionals, counseling the patient, family, or caregiver, and/or care coordination for the diagnoses above.      Lisbeth Renshaw, PA-C  [facility]: Bariatric Surgery         [facility]  [address] Floor  [place], [place] [address]  Ph: [phone] (OPT-5)  Fax: [phone]

## 2023-09-22 ENCOUNTER — Inpatient Hospital Stay: Admit: 2023-09-22 | Payer: Commercial Managed Care - HMO

## 2023-09-22 ENCOUNTER — Ambulatory Visit: Admit: 2023-09-22 | Payer: Commercial Managed Care - HMO

## 2023-09-22 ENCOUNTER — Inpatient Hospital Stay: Admit: 2023-09-22 | Discharge: 2023-11-22 | Payer: Commercial Managed Care - HMO

## 2023-09-22 LAB — COMPLETE BLOOD COUNT
Hematocrit: 43.5 % (ref 36.0–46.0)
Hemoglobin: 14.2 g/dL (ref 12.0–15.5)
MCH: 31.8 pg (ref 26.0–34.0)
MCHC: 32.6 g/dL (ref 31.0–36.0)
MCV: 98 fL (ref 80–100)
MPV: 9.7 fL (ref 9.1–12.6)
Platelet Count: 196 10*9/L (ref 140–450)
RBC Count: 4.46 10*12/L (ref 4.00–5.20)
RDW-CV: 12.3 % (ref 11.7–14.4)
WBC Count: 6.2 10*9/L (ref 3.4–10.0)

## 2023-09-22 LAB — GLUCOSE, NON-FASTING: Glucose, non-fasting: 135 mg/dL (ref 70–199)

## 2023-09-22 LAB — ALKALINE PHOSPHATASE: Alkaline Phosphatase: 48 U/L (ref 38–108)

## 2023-09-22 LAB — THYROID STIMULATING HORMONE: Thyroid Stimulating Hormone: 2.61 m[IU]/L (ref 0.45–4.12)

## 2023-09-22 LAB — ALBUMIN, SERUM / PLASMA: Albumin, Serum / Plasma: 3.8 g/dL (ref 3.4–4.8)

## 2023-09-22 LAB — CARBON DIOXIDE, TOTAL (INCLUDES ANION GAP)
Anion Gap: 7 (ref 4–14)
Carbon Dioxide, Total: 29 mmol/L (ref 22–29)

## 2023-09-22 LAB — LIPID PANEL (INCL. LDL, HDL, TOTAL CHOL. AND TRIG.)
Chol HDL Ratio: 4.4 (ref <5.6)
Cholesterol, HDL: 44 mg/dL (ref >39)
Cholesterol, Total: 192 mg/dL (ref <200)
LDL Cholesterol: 114 mg/dL (ref <130)
Non HDL Cholesterol: 148 mg/dL (ref <160)
Triglycerides, serum: 170 mg/dL (ref <200)

## 2023-09-22 LAB — BILIRUBIN, TOTAL: Bilirubin, Total: 0.3 mg/dL (ref 0.2–1.2)

## 2023-09-22 LAB — ASPARTATE TRANSAMINASE: AST: 23 U/L (ref 5–44)

## 2023-09-22 LAB — PROTHROMBIN TIME
INR: 1 (ref 0.9–1.2)
PT: 12.5 s (ref 11.6–14.8)

## 2023-09-22 LAB — CREATININE, SERUM / PLASMA
Creatinine: 0.72 mg/dL (ref 0.55–1.02)
eGFRcr: 94 mL/min/{1.73_m2} (ref >59)

## 2023-09-22 LAB — TYPE AND SCREEN
ABO/RH(D): O POS
Antibody Screen: NEGATIVE

## 2023-09-22 LAB — SODIUM, SERUM / PLASMA: Sodium, Serum / Plasma: 141 mmol/L (ref 135–145)

## 2023-09-22 LAB — CALCIUM, TOTAL, SERUM / PLASMA: Calcium, total, Serum / Plasma: 9.5 mg/dL (ref 8.4–10.5)

## 2023-09-22 LAB — IRON: Iron, serum: 100 ug/dL (ref 39–179)

## 2023-09-22 LAB — UREA NITROGEN, SERUM / PLASMA: Urea Nitrogen, serum/plasma: 15 mg/dL (ref 7–25)

## 2023-09-22 LAB — POTASSIUM, SERUM / PLASMA: Potassium, Serum / Plasma: 4.4 mmol/L (ref 3.5–5.0)

## 2023-09-22 LAB — HEMOGLOBIN A1C: Hemoglobin A1c: 6.7 %{total} — ABNORMAL HIGH (ref 4.3–5.6)

## 2023-09-22 LAB — FERRITIN: Ferritin, Serum/Plasma: 18 ug/L (ref 18–340)

## 2023-09-22 LAB — ACTIVATED PARTIAL THROMBOPLASTIN TIME: aPTT: 25 s (ref 21.6–30.8)

## 2023-09-22 LAB — ALANINE TRANSAMINASE: Alanine transaminase: 25 U/L (ref 10–61)

## 2023-09-22 LAB — VITAMIN D, 25-HYDROXY: Vitamin D, 25-Hydroxy: 65 ng/mL — ABNORMAL HIGH (ref 20–50)

## 2023-09-22 LAB — CHLORIDE, SERUM / PLASMA: Chloride, Serum / Plasma: 105 mmol/L (ref 101–110)

## 2023-09-22 LAB — TRANSFERRIN: Transferrin, Serum/Plasma: 324 mg/dL (ref 182–360)

## 2023-09-22 MED ORDER — AMINOPHYLLINE 250 MG/10 ML INTRAVENOUS SOLUTION
250 | Freq: Once | INTRAVENOUS | Status: DC
Start: 2023-09-22 — End: 2023-09-22

## 2023-09-22 MED ORDER — TC-99M-TETROFOSMIN 1.38 MG IV SOLUTION
1.38 | Freq: Once | INTRAVENOUS | Status: AC
Start: 2023-09-22 — End: 2023-09-22
  Administered 2023-09-22: 17:00:00 31 via INTRAVENOUS

## 2023-09-22 MED ORDER — TC-99M-TETROFOSMIN 1.38 MG IV SOLUTION
1.38 | Freq: Once | INTRAVENOUS | Status: AC
Start: 2023-09-22 — End: 2023-09-22
  Administered 2023-09-22: 16:00:00 9 via INTRAVENOUS

## 2023-09-22 MED ORDER — REGADENOSON 0.4 MG/5 ML INTRAVENOUS SYRINGE
0.4 | Freq: Once | INTRAVENOUS | Status: AC
Start: 2023-09-22 — End: 2023-09-22
  Administered 2023-09-22: 17:00:00 0 mg via INTRAVENOUS

## 2023-09-22 MED FILL — REGADENOSON 0.4 MG/5 ML INTRAVENOUS SYRINGE: 0.4 mg/5 mL | INTRAVENOUS | Qty: 1

## 2023-09-22 NOTE — H&P (Signed)
CARDIAC STRESS TEST PRE-PROCEDURE H&P      Referring Provider  Baruch Goldmann     Procedure  Pharmacologic Nuclear Stress Test    CC/Indication  Pre Op    HPI  62 y/o female with known obesity. She presents for ischemic evaluation for possible bariatric surgery. She denies CP or shortness of breath but admits that she is not active. She endorses a hx of DM, HTN, OSA, and HLD. She states that she has asthma but does not use inhalers during the day. She only uses inhalers when the weather is cold.          PMH  Past Medical History:   Diagnosis Date    Acid reflux     Asthma     Diabetes mellitus (CMS code)     Essential hypertension     High cholesterol     History of esophagogastroduodenoscopy (EGD)     Knee pain     Low back pain     Obesity     Sleep apnea     Uterine cancer (CMS code)        Past Surgeries  Past Surgical History:   Procedure Laterality Date    ANKLE FRACTURE SURGERY      CARPAL TUNNEL RELEASE      FINGER SURGERY      screw placement    HYSTERECTOMY      NASAL FRACTURE SURGERY      SHOULDER SURGERY      TUBAL LIGATION         Social Hx  Social History     Socioeconomic History    Marital status: Married     Spouse name: Not on file    Number of children: Not on file    Years of education: Not on file    Highest education level: Not on file   Occupational History    Not on file   Tobacco Use    Smoking status: Never    Smokeless tobacco: Never   Substance and Sexual Activity    Alcohol use: Not Currently     Comment: past rare social use; no history of problem use    Drug use: Never     Comment: no history of Rx med abuse or illicit substance use    Sexual activity: Not Currently   Other Topics Concern    Not on file   Social History Narrative    Not on file     Social Determinants of Health     Financial Resource Strain: Medium Risk (04/13/2023)    Overall Financial Resource Strain (CARDIA)     Difficulty of Paying Living Expenses: Somewhat hard   Food Insecurity: Food Insecurity Present  (04/13/2023)    Hunger Vital Sign     Worried About Running Out of Food in the Last Year: Often true     Ran Out of Food in the Last Year: Sometimes true   Transportation Needs: No Transportation Needs (04/13/2023)    PRAPARE - Therapist, art (Medical): No     Lack of Transportation (Non-Medical): No   Housing Stability: High Risk (04/13/2023)    Housing Stability Vital Sign     Unable to Pay for Housing in the Last Year: Yes     Number of Times Moved in the Last Year: Not on file     Homeless in the Last Year: Not on file       Family Hx  No known family hx of early heart disease    Allergies  Allergies/Contraindications   Allergen Reactions    Atorvastatin      myalgia    Lovastatin      diarrhea    Lisinopril      bad cough   bad cough    bad cough       Medications  Current Outpatient Medications   Medication Sig Dispense Refill    albuterol 90 mcg/actuation metered dose inhaler Inhale 2 puffs into the lungs continuous prn      atorvastatin (LIPITOR) 20 mg tablet Take 1 tablet (20 mg total) by mouth daily      BD INSULIN SYRINGE 1 mL 29 gauge x 1/2" SYRINGE 2 (two) times daily      HUMULIN 70/30 U-100 KWIKPEN 100 unit/mL (70-30) injection pen Inject 40 Units under the skin in the morning and 40 Units in the evening. Inject before meals. Holds for blood sugar less than 100.      ibuprofen (ADVIL,MOTRIN) 600 mg tablet Take 1 tablet (600 mg total) by mouth 4 (four) times daily as needed      losartan (COZAAR) 25 mg tablet Take 1 tablet (25 mg total) by mouth daily      metFORMIN (GLUCOPHAGE) 1,000 mg tablet Take 1 tablet (1,000 mg total) by mouth 2 (two) times daily      metoprolol succinate (TOPROL-XL) 25 mg 24 hr tablet Take 1 tablet (25 mg total) by mouth every evening      montelukast (SINGULAIR) 10 mg tablet Take 1 tablet (10 mg total) by mouth daily      omeprazole (PRILOSEC) 20 mg capsule Take 1 capsule (20 mg total) by mouth      ONETOUCH VERIO TEST STRIPS test strip TEST BLOOD SUGAR  TWO TIMES A DAY. E11.65      OZEMPIC 1 mg/dose (4 mg/3 mL) injection pen Inject 1 mg under the skin every 7 (seven) days      pioglitazone (ACTOS) 30 mg tablet Take 1 tablet (30 mg total) by mouth daily          ROS  ROS     Physical Exam  Gen: NAD, Alert & oriented x 3  Cardiac: RRR, normal S1/S2, no m/r/g appreciated  Pulmonary: CTA bilaterally, no wheezes    Pertinent Data  No results found for this or any previous visit.      ECHO:  No results found for this or any previous visit.     CATH:  No results found for this or any previous visit.       Plan  PETCT Rubidium Stress Test    Consent  Risks (such as possible signs/symptoms including but not limited to CP, SOB, arrhythmias, syncope, abnormal vitals, rarely a heart attack or cardiac complication); benefits; and alternatives of procedure (such as coronary angiogram) explained and consented.    Patient denies hx of recent sz or strokes, asthma, and COPD.    Denies caffeine intake in the past 12 hours.      Comments/Results  See Imaging/Cardiology tab for final report.      Nolene Ebbs, NP  [facility]

## 2023-09-23 LAB — PARATHYROID HORMONE, INTACT: PTH: 35 ng/L (ref 18–90)

## 2023-09-30 ENCOUNTER — Telehealth: Payer: Commercial Managed Care - HMO | Attending: Physician Assistant

## 2023-09-30 NOTE — Progress Notes (Signed)
I performed this consultation using real-time Telehealth tools, including a live video connection between my location and the patient's location. Prior to initiating the consultation, I obtained informed verbal consent to perform this consultation using Telehealth tools and answered all the questions about the Telehealth interaction.    HPI  I saw Susan Goodwin, DOB January 26, 1961, today at the [facility], Bariatric Surgery Program: [facility].  She is here for documentation of non surgical weight loss attempts to prepare for bariatric surgery.    Diet-wise,  Yesterday diet recall:  - Breakfast: 2 boiled eggs, 1/2 avocado  - Snack: blueberries, strawberries  - Lunch: [place], cheese stick, cucumber  - Snack: apple  - Dinner: vegetables, filet mignon     Hydration-wise, drinks 4-5 water bottles a day.    She has completed all of her pre-op labs: Yes, reviewed results from 09/22/23  She has completed her ECG: Yes, reviewed results from 09/22/23     She has completed her NM Stress test: Yes, reviewed results from 09/22/23     She has completed her nutrition visit: Yes  She has completed her psych visit: Yes     PE  Vitals: Wt (!) 141.5 kg (312 lb)   BMI 57.07 kg/m   Constitutional: oriented to person, place, and time.    Assessment & Plan  1. Morbid obesity (CMS code)  - Continue to increase protein intake and decrease carbohydrate and sugar intake like bread, rice, pasta, potatoes, tortillas  - Aim for 60g protein/day    2. Preop testing  - Schedule pre-op with Dr. Aundria Rud  - Reviewed pre-op prep (2 week liquid diet, bowel prep, hibiclens wash)    3. Type 2 diabetes mellitus with diabetic autonomic neuropathy, with long-term current use of insulin (CMS code)  - Follow up with PCP regarding insulin regimen during 2 week liquid diet     APP Visit Information:     APP Service Type:  Independent  Available MD consultant:  Alroy Dust, MD    I spent a total of 30  non-overlapping minutes on this patient's care on the day of their visit excluding time spent related to any billed procedures. This time includes time spent with the patient as well as time spent documenting in the medical record, reviewing patient's records and tests, obtaining history, placing orders, communicating with other healthcare professionals, counseling the patient, family, or caregiver, and/or care coordination for the diagnoses above.        Lisbeth Renshaw, PA-C  [facility]: Bariatric Surgery          General Surgery  [address]  [place], [place] [address]  Ph: [phone] (OPT-5)  Fax: [phone]

## 2023-10-13 ENCOUNTER — Telehealth: Admit: 2023-10-13 | Discharge: 2023-10-13 | Payer: Commercial Managed Care - HMO | Attending: Physician

## 2023-10-13 NOTE — Patient Instructions (Addendum)
Preparing for Surgery at [facility]                                Your surgeon has recommended that you have an operation.  The following instructions are designed to take you step-by-step through the process and to answer some frequently asked questions.    When am I having surgery and where do I go?  Your surgeon's office will provide you with the location, date, and time for your operation.  If you do not hear from your surgeon's office and want to check on the status of your upcoming procedure, please call the surgeon's office and ask to speak with his/her practice assistant.     Will I meet my anesthesiologist before surgery?  You will not meet the anesthesiologist assigned to take care of you until the day of surgery.  However, you will be assessed by one of [facility]'s nurse practitioners prior to your operation.  For some patients, this assessment will take place over the phone.  For other patients, an in-person evaluation will be required in our PREPARE clinic.      What do I need to do now?    You will receive a call from PREPARE 7-10 days before your surgery to set up either a phone consult or in-person PREPARE clinic appointment.  If you do not receive a call from the PREPARE clinic, please call your surgeon's office to confirm that your operation has been scheduled and the referral to PREPARE made.      For Phone consults:   -The Prepare provider will call during a 2 hr window   -Please be available for the full 2 hrs  -We may leave a voicemail or send a text  -Have a list of your current medications ready (including over the counter, vitamins, supplements, eye drops, topical meds, inhalers, etc)    Do I need to have any tests done prior to surgery?  Your surgeon may require laboratory or other diagnostics tests prior to surgery.  These are printed in your After Visit Summary from your surgery clinic visit, and your surgeon may have also given you printed requisition  forms.    If you are seen in-person in the [facility], your tests will be performed during your PREPARE appointment.    If you are evaluated by phone, a member of the PREPARE team will give you explicit instructions on when and where to have these studies done.      For many operations, laboratory tests, EKGs and chest x-rays are not needed.      If you are a Jehovah's Witness, you must notify your surgeon in advance of your procedure. In addition, you MUST discuss your desires regarding transfusion of blood products with your surgeon or the Nurse Practitioner from the [facility].  If you have a bleeding disorder such as von Willebrand's disease or hemophilia, notify your surgeon in advance as special arrangements may need to be made in preparation for your surgery (for example, referral to a hematologist).    Will [facility] review my health information from other places?  In order to plan for a smooth operation and recovery, we frequently need to review records from your other doctors.  If you have had any of the studies listed below, please arrange to have the reports faxed to the [facility] clinic.  Delays in obtaining these records may lead to a delay in your  upcoming procedure.    If you have any of the following documents, please Fax the following records to PREPARE at [phone]:  (If any of the following were done at [facility], you do not need to provide these records)  1.  Recent notes from your primary care provider  2.  Recent blood work (within 6 months)  3.  Stress test  4.  Echocardiogram (Echo)   5.  Electrocardiogram (EKG)  6   Cardiac catheterization   7   Pacemaker or ICD (Implantable cardioverter-defibrillator)   8.  Clinic notes from any specialist who has evaluated you in the past 2 years (for example a cardiologist, pulmonologist, hematologist)    Which medications should I stop or start prior to surgery?  Our PREPARE nurse practitioner will give you detailed instructions on how to manage your  medications prior to surgery.     Where do I go on the day of surgery?  And when?  PREPARE will give you instructions on where to go on the day of surgery.  Your surgeon's office will tell you when to arrive on the day of surgery.      Where is the PREPARE clinic?  There are two PREPARE clinics, one at [facility] and one at [facility].  If you are coming in for an in-person appointment, PREPARE will tell you which clinic to go to.  Directions are below:    Directions to [facility] PREPARE [phone]  The [place] PREPARE clinic is located on the second floor of Medical Building [address], [address], in [place].  Public parking at [facility] is available in the [employer] at [employer].   [facility] is accessible via [employer], which stops at [address] and [employer], and the following [employer], which stop in front of the hospital: [address], [address].    Directions to [facility] PREPARE [phone]  The [facility] PREPARE clinic is located on the third floor of the [facility] Building, [address], Reception [address] in [place]. Public parking for [employer] is available in the parking garage at [address].  For public transportation the [facility] is served by the [employer], which stops at the [employer] located on [address] near [address]. A new [address] line will provide service from the [address] BART station to the [facility].  For more information on Parking and transportation please call [phone].       Does my surgeon have any special instructions for me?  If your surgeon has specific instructions, they are listed below.    Preparing for weight-loss surgery      You will receive an email from [facility], our educational website, asking you to watch some videos  about bariatric surgery.  Please watch the video pertaining to your specific surgery and the video about anesthesia.  These videos will equip you with knowledge that will help you make an informed decision and learn about your specific surgery. You may watch them as many times as you would like!    Weight-loss surgery is a lifetime commitment to change that begins several weeks before the operation itself.  In order to maximize the safety and minimize the risk of complications, please read all of the instructions below.    ~~~~~~~~~~~~~~~~~~~~~~~~~~~~~~~~~~~~~~~~~~~~~~~~~  Preoperative liquid diet  Your surgeon recommends that you go on a "strictly liquids" diet before your weight loss surgery.  Weight loss just before surgery will shrink the size of your liver.  A smaller liver means an easier operation for your surgeon.    **All patients are required to start a Liquid Diet 2 weeks before their surgery date.**    During this time you will live on 3 protein shakes daily as your only source of calories.  No solid food.    The goal is 60-80 grams of protein per day.  You may also add a general multivitamin and calcium supplement to ensure proper nutrition leading up to surgery.  We recommend Bariatric ProCare multivitamin with 45mg  iron once per day. This is the multivitamin you will be taking after the operation.  It is available online only, without a prescription, at [email or web address].    Popular Proteins Shakes:   Premier Protein  30g protein, 160cal, 1g of sugar (available at [employer]) *contains lactose  Ensure Max Protein  30g protein, 150 cal, 1 g sugar  Muscle Milk Light  20g protein, 100cal, 0g of sugar (available at [facility])  It is ok to use powders or create your own.  The goal is 20-30g of protein, less than 200 calories and less than 3 grams of sugar per shake.    In addition to the 3 protein shakes per day, you also include the following liquids:    Acceptable Options while on the Liquid Diet:   Diet  Juices  Vitamin Water Zero, Crystal Light, any zero calorie beverage  Zero calorie flavored waters  Unsweetened tea  Coffee - black only, no milk, creamer, or sugar added  Broths: bone, vegetable, chicken and beef  Sugar free Jello  Sugar free popsicles  Protein shakes    Are there and side-effects?    Patients commonly experience looser bowel movements because fibrous and solid foods are not being eaten. Other effects include bad breath (which mouthwash and breathmints should improve), dizziness due to dehydration and fatigue due to low calorie intake and possible electrolyte imbalance. Ensure you are reaching your protein goals and aim to drink 64 ounces of water a day to stay well hydrated.    Please contact our clinic if you are experience concerning symptoms while on the liquid diet.    ~~~~~~~~~~~~~~~~~~~~~~~~~~~~~~~~~~~~~~~~~~~~~~~~~    Preparing For Bariatric Surgery  To help make the surgical process as straightforward as possible, we have created a patient education guide called What to Expect: Bariatric Surgery, which you should receive after your pre-operative appointment.  See below for some highlights:    After you meet with the bariatric team for the pre-operative appointment, a practice coordinator will give you specific instructions about the date and time of the operation. Information that will be reviewed at the visit:    You will need to stop taking any nonsteroidal anti-inflammatory drugs (NSAIDs) one week before your scheduled operation date. If you are having a gastric bypass, plan to never take these medicines ever again because you will now be at an increased risk of gastro-intestinal ulcers.  If you are having a sleeve gastrectomy, please avoid NSAIDs for the first three months after surgery. Examples of nonsteroidal anti-inflammatory drugs (NSAIDs) are: Naprosyn, Ibuprofen, Aleve, Aspirin, Celebrex, Advil/Motrin.  Your Anesthesia Prepare provider will give you detailed instructions  regarding which of your medications to take or hold before surgery.  They will call to discuss this information ~7 days prior to  surgery.    You will have a COVID-19 test (even if you are vaccinated and boosted) 3-4 days prior to surgery.  A practice coordinator will help you schedule this.    On the day before your operation you should have nothing to eat or drink after midnight. You will also take a mild bowel prep, Magnesium Citrate, around 10am the morning before surgery.  This is an over the counter medication; any brand or flavor is ok.  You can choose to drink in one sitting or space it out.    The night before and morning of surgery you with use a special body wash called Hibiclens.  This helps to decrease bacteria on your skin.  This can also be purchased over the counter.       ~~~~~~~~~~~~~~~~~~~~~~~~~~~~~~~~~~~~~~~~~~~~~~~~~    Your Stay in the Hospital    What to bring  Please bring a photo I.D. with you to the hospital. We recommend that you leave all valuables at home. If you have sleep apnea and use a CPAP machine at night, you should bring your machine with you. You may wish to bring a light robe and slippers, a toothbrush and any other toiletries that will make you more comfortable.    The morning of the operation  Be sure not to eat or drink anything.  You will report at the designated time to the Surgery Waiting Area on the main entry floor of [facility], [address]. You should check in with the receptionist and then be seated. When told by the operating room staff, a [facility] employee will escort you to the 4th Floor to a gurney in the pre-op area. You will then get into a hospital gown and begin the preparations for the operation.    After the operation, also called Post-Op Day 0  You will spend one or two hours in the post-anesthesia care unit, then be transferred to a room on the surgical floor. If you have severe sleep apnea, asthma, or a lung condition, you might need to  spend a short period in the Intensive Care Unit.   Expect to be out of bed and walking. Your nurse will tell you when you are safe to walk by yourself. The first time you walk will be with your nurse.  Let your nurse know if you have pain that prevents you from moving. Oral medications will be used for pain management and works best when used regularly. Ask for another dose before the pain gets too bad, so that we can avoid chasing your pain.  It is common to feel sick to your stomach after surgery now that your stomach is so small. A good strategy to reduce this feeling is to take small, frequent sips of fluids. This will help to reduce the risk of dehydration as well. We can also give you medication, if needed, to reduce this feeling.    Post-Op Day 1  Expect to be tolerating small sips of oral fluids and walking often to reduce the risk of blood clots.  You will be started on an acid reduction medication to reduce your risk of developing ulcers in your digestive tract.  The dietitian will review the bariatric diet recommendations with you. This is a great time to have your diet specific questions answered.  Plan to be discharged from the hospital the day after surgery.    Post-Op Day 2  You will likely be home at this point; however, some patients may need  to stay another night at the hospital.  At this point you should spend most of the day out of bed and walking. Have a family member help. You will continue a liquid diet as well as oral pain medication.    Prepare for Discharge  You will be ready for discharge is you are meeting post-surgery goals: Walking on your own, tolerating liquids, and pain is well controlled  If you have a drain in place, this will be removed before you are discharged.  You will receive all medications needed at home through the [facility] to [facility]  You will be seen for post-operative follow-up through video visit with the bariatric clinic 1-3 weeks after surgery.      Post  Operation Instructions For Your Care at Home  It is very common to experience nausea and loose stools in the first month. These symptoms will go away with time.    The 24-hour contact number for the Surgery Faculty Practice is [phone].  Please call this number with any questions you might have. Call this number as soon as possible if any of the following symptoms occur:    1. Fever of greater than 101.100F  2. Nausea or vomiting (especially if you are unable to keep liquids down)  3. Severe pain at the incision  4. Increasing redness or foul-smelling drainage from the incision  5. Uncontrolled bleeding  6. Persistent diarrhea or more than 10 bowel movements in 24 hours  7. You are not able to urinate after 8 hours  8. If you experience dizziness, lightheadedness, or extreme fatigue    Medications & Pain Control  When you leave the hospital, you will be given prescriptions for several medications, some of which you will only take for a few months and others will be lifelong. These medications must be taken as prescribed and should be started within the first week of arriving home. You will be provided detailed instructions regarding the adjustment of chronic medications and addition of new medications.  Please contact our clinic with any questions.      You will be prescribed a medicine to help with pain control. Over time you will need less and less of the medicine, and you should gradually reduce the amount you take in the weeks after surgery. While Tylenol is ok, do not take any form of non-steroidal anti-inflammatory drugs (NSAIDS) such as the ones mentioned above!     Immediately following surgery and for at least the following 6 months, you will be prescribed a medicine that decreases acid production by your stomach in order to decrease the risk of ulcers.  This medication is a proton pump inhibitor (PPI), such as omeprazole (Prilosec) capsules 20mg  daily. Your surgeon may recommend that you continue  using an acid reduction medication for the rest of your life.    If you still have a gallbladder, you will be prescribed a medicine called Ursodiol to help prevent the formation of gallstones. The dose is 300mg  twice a day.  This should be taken for 6 months after surgery.    Vitamins and Minerals are Required Daily  Procare bariatric multivitamin once daily  Celebrate calcium + vitamin D chewable     While you will be able to pick up your first month supply from the [facility] at [facility], no prescription is needed.  We recommend ordering both online for subsequent refills as that is the most affordable options.  Go to Procarenow.com or Amazon.com for the multivitamin.  Go to celebratevitamins.com for the calcium chews.      Activity  It is common to feel a little more tired than usual in the first couple of weeks after surgery. This is to be expected, and you should listen to your body to determine the appropriate level of activity.    You may return to full activity, including work, when you feel ready.  After a minimally invasive surgery this usually occurs within two weeks.  If you had minimally invasive surgery, you may want to wait 4-6 weeks before resume rigorous activity.   You may perform normal activities, such as walking up and down stairs, walking outside the house, doing chores about the house and riding in a car when you feel up to it.  You may drive after surgery as long as long as you are not taking any opioid pain medication.    Bowel Movements  You will likely have irregular bowel movements after bariatric surgery, but they will normalize over time.  If you have not had a bowel movement 4 days past your surgery, we recommend taking a dose of Milk of Magnesia. This will help for acute constipation by speeding up bowel motility.  To prevent constipation, we recommend adding powder fiber (Benefiber or Metamucil) with increased fluid intake, stool softner (Colace), smooth move tea, or small doses of prune  juice.     Wound Care  You may take showers after your operation. Let the soap and water run over your incisions. If you had a gastric bypass, expect some drainage where the drain was removed.  This is normal and will resolve after a few days.  Once the surgical glue peels off, you are ok to apply vaseline or aquaphor to the incisions.  This keeps the incisions well moisturized and helps speed up healing.      Follow-up Contacts  Your first postoperative appointment should be scheduled within 1-3 weeks of leaving the hospital. If you don't have an appointment scheduled, MyChart your surgeon's office.    It is recommended that you meet regularly with a dietitian and join a support group in your community to assist in your lifestyle adjustments.     Post-Operative Follow-up Appointments  You will be seen by a member of our bariatric team via video visit.  If you wish to have a follow-up appointment with the dietitian, please let us know so the visit can be scheduled accordingly:  1-3 weeks after discharge  3 months post-op  6 months post-op  12 months post-op  every year thereafter    Monitoring lab values is important to avoid complications of malnutrition. Labs should start with the 3 month visit, and be drawn about 2 weeks before your appointment so the results can be discussed with your bariatric team.  If you are getting your labs drawn at your local hospital, please have a copy of the results faxed to the clinic at [phone].  These labs are recommended:  Complete blood count  Complete metabolic panel  Lipid Panel  Iron panel  Folate, thiamine, copper, iron, ferritin, selenium, zinc, vitamin A, D, B12  Hemoglobin A1C  PTH     Approach to Diet After Your Operation  The diet guidelines are designed to limit calories while providing a balanced meal plan to help prevent nutrient deficiencies and preserve muscle tissue. Tolerance and progression will vary greatly from patient to patient.    General Guidelines  Eat  slowly and chew small bites of food thoroughly.  When you first start taking solids, avoid rice, bread, raw vegetables, and meats that are not easily chewed such as pork and steak. Ground meats are usually better tolerated.  Eat balanced meals with small portions.  Keep a daily record of your food portions, plus calorie and protein intake.  Concentrate on following a diet low in calories and sweets.  Avoid sugar, sugar-containing foods and beverages, concentrated sweets, and fruit juices.    Fluids  Drink extra water and low calorie or calorie-free fluids between meals to avoid dehydration. Sip about one cup of fluid between each small meal, 6 to 8 times a day. At least two liters (64 ounces or 8 cups) of fluid a day is recommended. You will gradually be able to meet this target.    We strongly warn against the use of any alcoholic beverages. Alcohol will get absorbed into your system much more quickly than before, and the sedative and mood- altering affects are more difficult to predict and control.    Protein  Preserve muscle tissue by eating foods rich in protein. High protein foods are eggs, meats, fish, seafood, tuna, poultry, soy milk, tofu, cottage cheese, yogurt, and other milk products. Your goal should be a minimum of 60-80 grams of protein per day. It might be difficult to reach this goal in the first few weeks after your operation.     Diet Progression After Bariatric Surgery  Please reference our "Diet after Bariatric Surgery" education guide for detailed directions regarding diet advancement after surgery.                                 Sherlynn Carbon, MD  Barbara Cower, MD   Randalyn Rhea, MD

## 2023-10-13 NOTE — Progress Notes (Signed)
HPI  I saw Susan Goodwin today in the [facility] Surgery [facility] at the [facility] [place] [place].  As you know, she is in the process of being evaluated for bariatric surgery.      She presents today after having completed the necessary required preoperative tests and consultations.  Specifically, the results of the completed tests and consultations include the following:    Estimated body mass index is 57.61 kg/m as calculated from the following:    Height as of 04/14/23: 157.5 cm (5\' 2" ).    Weight as of 04/14/23: 142.9 kg (315 lb).     Consult Date: 04/14/23  Surgeon: Dr. Aundria Rud     09/22/23 -  ECG  NSR     09/22/23 - NM Stress test  1.  Negative study for ischemia or scar.  2.  Overall gated left ventricular systolic function was normal with a calculated LVEF of 66% at stress and 63% at rest.  3.  No left ventricular dilatation     Stress EKG Report  1. The ECG portion of this test is negative for Lexiscan induced ischemia.  2. Scintigraphy (nuclear imaging) is reported separately.     05/20/23 - Upper GI Endoscopy (scan clin)  - The nasopharynx was normal  - The examined esophagus was normal  - Localized mildly erythematous mucosa without bleeding was found in the gastric antrum. Biopsies were taken with a cold forceps for histology.  - The examined duodenum was normal.     Stomach, body and entrance, biopsy:  - Chronic inactive gastritis  - No intestinal metaplasia, dysplasia, Helicobacter pylori or neoplasm is evident     07/16/20 - Colonoscopy  - One 3 mm polyp in the sigmoid colon, removed with a cold biopsy forceps. Resected and retrieved.  - Diverticulosis in the sigmoid colon and in the descending colon     09/22/23 -  Pre Op Evaluation Labs  CMP ALT 23   CBC wnl   Lipid panel wnl    PT/INR, PTT wnl   hemoglobin A1C  6.7   TSH, PTH  wnl   Iron, ferritin  wnl   Vitamin D 25(OH) 65   H.pylori screening (05/20/23) Negative on EGD      05/21/23 -  Evaluation by a mental health care practitioner  for clearance for bariatric surgery  Cleared      07/02/23 -  Evaluation by your Primary Care Provider and letter of recommendation/clearance for bariatric surgery  Cleared  (scan clin)     05/19/23 -  Evaluation by a nutritionist or dietician  Cleared      Alcohol, drug, and tobacco screening completed: Yes     Insurance: [employer] which requires 3 month  07/19/23  08/16/23  09/14/23  09/30/23           PE  Vitals:    10/13/23 0928   Weight: (!) 141.5 kg (312 lb)   Height: 157.5 cm (5\' 2" )     Height: 5\' 2"   Weight: (!) 141.5 kg (312 lb)   Constitutional:       Appearance: Patient is well-developed. Patient is not diaphoretic.   Pulmonary:      Effort: Pulmonary effort is normal. No respiratory distress.   Abdominal:      General: She is obese   Skin:     Coloration: Skin is not pale.   Neurological:      Mental Status:She is alert and oriented to person, place, and time.  Psychiatric:         Behavior: Behavior normal.         Thought Content: Thought content normal.         Judgment: Judgment normal.      Assessment and Plan  In summary, Susan Goodwin is a 62year-old female with severe obesity (BMI 57.07) and associated comorbidities.She presents for consideration of bariatric surgery.  She does satisfy the 2022 criteria set forth for bariatric surgery by the ASMBS and IFSO.    The scheduling process will occur over the next few days and the date for surgery will likely be within the next 4-6 weeks, depending on insurance authorization. Two weeks before surgery, the patient will be placed on a very low-calorie liquid diet. We have given written and oral instructions how to do this.     Today I have again extensively discussed the technical aspects of bariatric surgery with the patient as well as the expected recovery. I have also again discussed the risks, benefits, and alternatives of these procedures.  The patient understands that surgery is only a tool to help with weight loss, and that she will need to  maintain a carefully organized and attentive nutritional program with the help of a dietician both before and after surgery.  She understands these plans, the technical aspects of surgery and risks involved, including those associated with both anesthesia and surgery.  She wishes to proceed with scheduling and surgery.       After thinking about options for surgery, she has decided to proceed with a laparoscopic gastric bypass.  The patient has asked appropriate questions, all of which have been answered today.   Prior tosurgery she will be evaluated by the [facility].     If you have any questions,please feel free to contact me at your convenience at the [facility].  I appreciate the opportunity to participate in the care of your patient.    Documentation of Visit Statement  I spent a total of 65 minutes in face-to-face time with the patient and in non-face-to-face activities conducted today, 10/28/23, directly related to this video visit, including reviewing records and tests, obtaining history and exam, placing orders, communicating with other healthcare professionals, counseling the patient, family, or caregiver, documenting in the medical record, and/or care coordination for the diagnoses above.    I performed this consultation using real-time Telehealth tools, including a live video connection between my location and the patient's location. Prior to initiating the consultation, I obtained informed verbal consent to perform this consultation using Telehealth tools and answered all the questions about the Telehealth interaction.    I, Durward Parcel, am acting as a Neurosurgeon for services provided by Alroy Dust, MD on 10/13/2023 5:16 PM    The above scribed documentation accurately reflects the services I have provided.    Alroy Dust, MD   10/13/2023 8:28 AM

## 2023-10-15 ENCOUNTER — Telehealth: Payer: Commercial Managed Care - HMO | Attending: Pharmacist

## 2023-10-15 MED ORDER — ATORVASTATIN 20 MG TABLET
20 | Freq: Every day | ORAL | Status: AC
Start: 2023-10-15 — End: ?

## 2023-10-15 NOTE — Progress Notes (Signed)
[facility]  Pharmacy Consult Note, Pre-Operative Evaluation   Bariatric Surgery Clinic Pharmacy Recommendations    Name: Susan Goodwin  MRN: [id number]  DOB: 1961-01-25  BMI: 57.07 kg/m    Planned procedure: Roux-En-Y Gastric Bypass s/f 11/02/23  Surgeon: Dr. Aundria Rud    Relevant Surgical History: n/a    Past Medical History:   Diagnosis Date    Acid reflux     Asthma     Diabetes mellitus (CMS code)     Essential hypertension     High cholesterol     History of esophagogastroduodenoscopy (EGD)     Knee pain     Low back pain     Obesity     Sleep apnea     Uterine cancer (CMS code)        Allergies/Contraindications   Allergen Reactions    Atorvastatin      myalgia    Lovastatin      diarrhea    Lisinopril      bad cough   bad cough    bad cough       Prior Outpatient Medication List:  Current Outpatient Medications on File Prior to Visit   Medication Sig Dispense Refill    albuterol 90 mcg/actuation metered dose inhaler Inhale 2 puffs into the lungs continuous prn      atorvastatin (LIPITOR) 20 mg tablet Take 1 tablet (20 mg total) by mouth daily      HUMULIN 70/30 U-100 KWIKPEN 100 unit/mL (70-30) injection pen Inject 40 Units under the skin in the morning and 40 Units in the evening. Inject before meals. Holds for blood sugar less than 100.      ibuprofen (ADVIL,MOTRIN) 600 mg tablet Take 1 tablet (600 mg total) by mouth 4 (four) times daily as needed      losartan (COZAAR) 25 mg tablet Take 1 tablet (25 mg total) by mouth daily      metFORMIN (GLUCOPHAGE) 1,000 mg tablet Take 1 tablet (1,000 mg total) by mouth 2 (two) times daily      metoprolol succinate (TOPROL-XL) 25 mg 24 hr tablet Take 1 tablet (25 mg total) by mouth every evening      montelukast (SINGULAIR) 10 mg tablet Take 1 tablet (10 mg total) by mouth daily      omeprazole (PRILOSEC) 20 mg capsule Take 1 capsule (20 mg total) by mouth      ONETOUCH VERIO TEST STRIPS test strip TEST BLOOD SUGAR TWO TIMES A DAY. E11.65      pioglitazone  (ACTOS) 30 mg tablet Take 1 tablet (30 mg total) by mouth daily      BD INSULIN SYRINGE 1 mL 29 gauge x 1/2" SYRINGE 2 (two) times daily      [DISCONTINUED] atorvastatin (LIPITOR) 20 mg tablet Take 1 tablet (20 mg total) by mouth daily      [DISCONTINUED] OZEMPIC 1 mg/dose (4 mg/3 mL) injection pen Inject 1 mg under the skin every 7 (seven) days       No current facility-administered medications on file prior to visit.       UPDATED Outpatient Medication List:  Your Medications at the End of This Visit         Disp Refills Start End    albuterol 90 mcg/actuation metered dose inhaler -- --  --    Sig - Route: Inhale 2 puffs into the lungs continuous prn - Inhalation    Class: Historical Med    atorvastatin (LIPITOR) 20  mg tablet -- -- 04/07/2023 --    Sig - Route: Take 1 tablet (20 mg total) by mouth daily - Oral    Class: Historical Med    HUMULIN 70/30 U-100 KWIKPEN 100 unit/mL (70-30) injection pen -- --  --    Sig - Route: Inject 40 Units under the skin in the morning and 40 Units in the evening. Inject before meals. Holds for blood sugar less than 100. - Subcutaneous    Class: Historical Med    ibuprofen (ADVIL,MOTRIN) 600 mg tablet -- --  --    Sig - Route: Take 1 tablet (600 mg total) by mouth 4 (four) times daily as needed - Oral    Class: Historical Med    losartan (COZAAR) 25 mg tablet -- --  --    Sig - Route: Take 1 tablet (25 mg total) by mouth daily - Oral    Class: Historical Med    metFORMIN (GLUCOPHAGE) 1,000 mg tablet -- --  --    Sig - Route: Take 1 tablet (1,000 mg total) by mouth 2 (two) times daily - Oral    Class: Historical Med    metoprolol succinate (TOPROL-XL) 25 mg 24 hr tablet -- --  --    Sig - Route: Take 1 tablet (25 mg total) by mouth every evening - Oral    Class: Historical Med    montelukast (SINGULAIR) 10 mg tablet -- --  --    Sig - Route: Take 1 tablet (10 mg total) by mouth daily - Oral    Class: Historical Med    omeprazole (PRILOSEC) 20 mg capsule -- -- 10/21/2022 --    Sig -  Route: Take 1 capsule (20 mg total) by mouth - Oral    Class: Historical Med    ONETOUCH VERIO TEST STRIPS test strip -- -- 01/13/2023 --    Sig: TEST BLOOD SUGAR TWO TIMES A DAY. E11.65    Class: Historical Med    pioglitazone (ACTOS) 30 mg tablet -- -- 11/24/2022 --    Sig - Route: Take 1 tablet (30 mg total) by mouth daily - Oral    Class: Historical Med    BD INSULIN SYRINGE 1 mL 29 gauge x 1/2" SYRINGE -- -- 01/13/2023 --    Sig: 2 (two) times daily    Class: Historical Med            Assessment and Plan  Susan Goodwin is a 62 y.o. female being seen in anticipation of bariatric surgery.   [x]  gallbladder intact   [x]  signed up for Meds to Beds    Preliminary VTE risk assessment is MODERATE (0.52% predicted probability of 30-day post-discharge VTE) based on the following criteria:    Patient characteristics:   Female   Age >= 60 years   BMI >= 50 kg/m2   No CHF   No Dyspnea at Rest   No Paraplegia   No past history of VTE   No presence of congenital or acquired hypercoagulable     Final VTE risk assessment pending surgical course:   Assumptions in pre-op setting:   Yes to Non-Gastric Band Surgery (Sleeve or RYGB)   No to Operation Time >= 3 hours   No to Return to Operating Room   No to Length of Stay >= 3 days   *if any of the criteria change, please recalculate risk [name] Surg 2017;[id number])     Note NOT TAKING the following home meds as reported by  patient:  Semaglutide (Ozempic) injection    PRE-surgery pharmacy recommendations  Check blood sugars more frequently while on the full liquid diet (2 weeks prior to surgery), may need to adjust insulin regimen based on levels  Monitor for low blood sugar, decrease insulin dose accordingly as discussed.  ADJUST insulin regimen titration to be managed by primary care provider, okay to hold metformin if preferred.    HOLD all supplements and NSAIDs (like ibuprofen) for 2 weeks prior to surgery.  NOTE prior to surgery, you will be contacted our [facility] who may further instruct you on what medications to take and what NOT to take the day before surgery and the day of surgery.     POST-surgery pharmacy recommendations      CONTINUE the following medications as you normally take them AFTER surgery:  Omeprazole (Prilosec) capsule: continue at least once daily for ulcer protection   Albuterol (Proair) inhaler    CONTINUE the following medications, but CRUSH all tablets before taking them for 3 months AFTER surgery:  Montelukast (Singulair) tablet  Metformin immediate-release tablet  Atorvastatin (Lipitor) tablet    WAIT on instruction prior to discharge from the hospital:  Insulin NPH (Novolin,Humulin 70/30) currently on 40 units twice daily    HOLD the following medications AFTER surgery until seen in clinic:  Losartan (Cozaar) tablet  Pioglitazone (Actos) tablet     SWITCH the following medications AFTER surgery:  Metoprolol succinate (Toprol XL): REPLACE with immediate-release version which is safe to crush for 3 months    DISCONTINUE the following medications AFTER surgery:  Semaglutide (Ozempic) injection  Ibuprofen (Motrin): NSAID      NEW Medications to start on Discharge:  Prescription:  Ursodiol 300mg  capsules (ONLY if gallbladder in place):   1 capsule by mouth twice daily for 6 months   To prevent gallstones  Acetaminophen (Tylenol) 500mg  tablet extra strength  CRUSH 2 tablets (1000mg ) take by mouth three times daily  For pain, take around the clock initially then transition to as needed for mild pain  Never take more than 4000mg  per day, 8 tablets (maximum daily amount). If you have liver problems, do not take more than 2000mg  per day.  Gabapentin capsule 300mg    2 capsules (600mg ) by mouth nightly for 7 days  For neuropathic pain  Additional Pain medication: To be determined at discharge if needed, you may also receive an "opioid/narcotic" medication for moderate-severe pain unrelieved by acetaminophen and gabapentin.   Please take the minimum  amount necessary to control your pain, as these medications are addictive and carry many side effects (drowsiness, dizziness, confusion, slowed breathing, constipation)  Avoid alcohol and driving    Lactulose 56E/33IR oral solution  Take 15 to 30mL (10 to 20g) by mouth daily; may increase to 60mL (40g) daily if needed for constipation  Take daily while on oxycodone or any narcotic pain medications, then as needed for constipation thereafter   HOLD if experiencing loose stools or diarrhea  May mix with fruit juice, water, or milk    Enoxaparin (Lovenox) syringe:    DOSE and duration to be determined   Inject syringe subcutaneously twice daily  To prevent blood clots    Metoprolol tartrate immediate-release tablet 25 mg  HALF tablet (12.5 mg) by mouth twice daily  Crush for 3 months  Replaces XL version tablet for 3 months      Over-the-counter:   ProCare Bariatric Complete Multivitamin (45mg  iron) capsules:  Take 1 capsule by mouth daily  at bedtime for life  No gummy vitamins. They do not contain all the nutrients you need  Take separately from calcium         2. Calcium Citrate 500mg  + Vitamin D 500unit softchews (Celebrate):   Take 1 softchew by mouth twice daily with breakfast and dinner for life  If you do not eat a diet rich in calcium, take one additional softchew with lunch daily  Take separately from multivitamin     NOTE that these are NOT covered by insurance, over-the-counter (OTC) cost estimates:  Procare multivitamin $13.59  Calcium citrate- vitamin D softchew $33.99    Please be sure to purchase the ProCare Bariatric Complete Multivitamin is currently available for purchase online at [email or web address] and sometimes [email or web address].     Note that the Celebrate Calcium Citrate with Vitamin D Soft Chew is currently available for purchase online at [email or web address] and sometimes [email or web address]. If you are unable to obtain our preferred regimen, please refer to our alternate regimen in your dietary information  packet.    Post Bariatric Surgery Medication Education  1. CRUSH all tablets for 3 months after surgery. May take capsules whole after surgery or OPEN capsules for 3 months after surgery. Please consult with outpatient pharmacist or clinic if starting a new medication during this time to make sure it can be crushed or opened.   2. DISCONTINUE the use of all "NSAID's" (Non-Steroidal Anti-Inflammatory Drugs), including aleve, motrin, ibuprofen, and naproxen. Avoid aspirin unless approved by your surgeon. TYLENOL (acetaminophen) is okay to take for pain. Note for sleeve gastrectomy procedures, NSAID's may be resumed 6 months after surgery at surgeons discretion.  3. DISCONTINUE use of all sleep aid medications (ex. benadryl, ambien) and benzodiazepines (ex. ativan, xanax, valium) WHILE ON PAIN MEDICATIONS. Melatonin is safe.     Additional medication counseling points discussed:    - Clot prevention:   - Enoxaparin (Lovenox) possibly given upon discharge from the hospital, given "moderate " VTE risk on preliminary evaluation.   - Plan for RN enoxaparin teaching in-house on injection technique.    Home Pharmacy:  [phone] [facility] - [address]  [address]  [place] [address]  Phone: [phone] Fax: [phone]    Preferred Discharge Pharmacy: would like [facility]  [address] [place], [place] [address]  [place] [address]  Phone: [phone] Fax: [phone]      Summary    I performed this evaluation using real-time telehealth tools, including a live video Zoom connection between my location and the patient's location. Prior to initiating, the patient consented to perform this evaluation using telehealth tools.            I spent a total of 45 minutes virtually face-to-face with the patient reviewing medication plans, changes, new medications, counseling regarding dosing instructions and adherence. I completed and documented a full pre-operative  medication reconciliation, review of therapeutic changes post-operatively, and counseled on new medications.     Patient expressed understanding. All questions were answered.      Francis Gaines, PharmD, BCPS  Clinical Pharmacist

## 2023-10-15 NOTE — Patient Instructions (Addendum)
Bariatric Surgery Clinic Pharmacy Recommendations    PRE-surgery pharmacy recommendations  Check blood sugars more frequently while on the full liquid diet (2 weeks prior to surgery), may need to adjust insulin regimen based on levels  Monitor for low blood sugar, decrease insulin dose accordingly as discussed.  ADJUST insulin regimen titration to be managed by primary care provider, okay to hold metformin if preferred.    HOLD all supplements and NSAIDs (like ibuprofen) for 2 weeks prior to surgery.  NOTE prior to surgery, you will be contacted our [facility] who may further instruct you on what medications to take and what NOT to take the day before surgery and the day of surgery.     POST-surgery pharmacy recommendations      CONTINUE the following medications as you normally take them AFTER surgery:  Omeprazole (Prilosec) capsule: continue at least once daily for ulcer protection   Albuterol (Proair) inhaler    CONTINUE the following medications, but CRUSH all tablets before taking them for 3 months AFTER surgery:  Montelukast (Singulair) tablet  Metformin immediate-release tablet  Atorvastatin (Lipitor) tablet    WAIT on instruction prior to discharge from the hospital:  Insulin NPH (Novolin,Humulin 70/30) currently on 40 units twice daily    HOLD the following medications AFTER surgery until seen in clinic:  Losartan (Cozaar) tablet  Pioglitazone (Actos) tablet     SWITCH the following medications AFTER surgery:  Metoprolol succinate (Toprol XL): REPLACE with immediate-release version which is safe to crush for 3 months    DISCONTINUE the following medications AFTER surgery:  Semaglutide (Ozempic) injection  Ibuprofen (Motrin): NSAID      NEW Medications to start on Discharge:  Prescription:  Ursodiol 300mg  capsules (ONLY if gallbladder in place):   1 capsule by mouth twice daily for 6 months   To prevent gallstones  Acetaminophen (Tylenol) 500mg  tablet extra strength  CRUSH 2 tablets (1000mg )  take by mouth three times daily  For pain, take around the clock initially then transition to as needed for mild pain  Never take more than 4000mg  per day, 8 tablets (maximum daily amount). If you have liver problems, do not take more than 2000mg  per day.  Gabapentin capsule 300mg    2 capsules (600mg ) by mouth nightly for 7 days  For neuropathic pain  Additional Pain medication: To be determined at discharge if needed, you may also receive an "opioid/narcotic" medication for moderate-severe pain unrelieved by acetaminophen and gabapentin.   Please take the minimum amount necessary to control your pain, as these medications are addictive and carry many side effects (drowsiness, dizziness, confusion, slowed breathing, constipation)  Avoid alcohol and driving    Lactulose 98J/19JY oral solution  Take 15 to 30mL (10 to 20g) by mouth daily; may increase to 60mL (40g) daily if needed for constipation  Take daily while on oxycodone or any narcotic pain medications, then as needed for constipation thereafter   HOLD if experiencing loose stools or diarrhea  May mix with fruit juice, water, or milk    Enoxaparin (Lovenox) syringe:    DOSE and duration to be determined   Inject syringe subcutaneously twice daily  To prevent blood clots    Metoprolol tartrate immediate-release tablet 25 mg  HALF tablet (12.5 mg) by mouth twice daily  Crush for 3 months  Replaces XL version tablet for 3 months      Over-the-counter:   ProCare Bariatric Complete Multivitamin (45mg  iron) capsules:  Take 1 capsule by mouth daily at  bedtime for life  No gummy vitamins. They do not contain all the nutrients you need  Take separately from calcium         2. Calcium Citrate 500mg  + Vitamin D 500unit softchews (Celebrate):   Take 1 softchew by mouth twice daily with breakfast and dinner for life  If you do not eat a diet rich in calcium, take one additional softchew with lunch daily  Take separately from multivitamin     NOTE that these are NOT covered  by insurance, over-the-counter (OTC) cost estimates:  Procare multivitamin $13.59  Calcium citrate- vitamin D softchew $33.99    Please be sure to purchase the ProCare Bariatric Complete Multivitamin is currently available for purchase online at [email or web address] and sometimes [email or web address].     Note that the Celebrate Calcium Citrate with Vitamin D Soft Chew is currently available for purchase online at [email or web address] and sometimes [email or web address]. If you are unable to obtain our preferred regimen, please refer to our alternate regimen in your dietary information packet.    Post Bariatric Surgery Medication Education  1. CRUSH all tablets for 3 months after surgery. May take capsules whole after surgery or OPEN capsules for 3 months after surgery. Please consult with outpatient pharmacist or clinic if starting a new medication during this time to make sure it can be crushed or opened.   2. DISCONTINUE the use of all "NSAID's" (Non-Steroidal Anti-Inflammatory Drugs), including aleve, motrin, ibuprofen, and naproxen. Avoid aspirin unless approved by your surgeon. TYLENOL (acetaminophen) is okay to take for pain. Note for sleeve gastrectomy procedures, NSAID's may be resumed 6 months after surgery at surgeons discretion.  3. DISCONTINUE use of all sleep aid medications (ex. benadryl, ambien) and benzodiazepines (ex. ativan, xanax, valium) WHILE ON PAIN MEDICATIONS. Melatonin is safe.     Additional medication counseling points discussed:    - Clot prevention:   - Enoxaparin (Lovenox) possibly given upon discharge from the hospital, given "moderate " VTE risk on preliminary evaluation.   - Plan for RN enoxaparin teaching in-house on injection technique.

## 2023-10-21 LAB — ECG STRESS REPORT
Max Diastolic BP: 80 mm[Hg]
Max Heart Rate: 105 {beats}/min
Max Predicted Heart Rate: 158 {beats}/min
Max Work Load (METS*10): 10
Max. Systolic BP: 163 mm[Hg]

## 2023-10-21 LAB — ECG 12-LEAD
Atrial Rate: 70 {beats}/min
Calculated P Axis: -8 degrees
Calculated R Axis: 53 degrees
Calculated T Axis: 40 degrees
P-R Interval: 144 ms
QRS Duration: 90 ms
QT Interval: 388 ms
QTcb: 419 ms
Ventricular Rate: 70 {beats}/min

## 2023-10-25 MED ORDER — VITAMIN E 268 MG (400 UNIT) CAPSULE
400 | Freq: Two times a day (BID) | ORAL | Status: AC
Start: 2023-10-25 — End: ?

## 2023-10-25 MED ORDER — CALCIUM-MAGNESIUM-ZINC 333 MG-133 MG-5 MG TABLET
333-133-5 | Freq: Every evening | ORAL | Status: AC
Start: 2023-10-25 — End: ?

## 2023-10-25 MED ORDER — FLUTICASONE PROPIONATE 50 MCG/ACTUATION NASAL SPRAY,SUSPENSION
50 | Freq: Two times a day (BID) | NASAL | Status: AC
Start: 2023-10-25 — End: ?

## 2023-10-25 MED ORDER — BIOTIN ORAL
Freq: Two times a day (BID) | ORAL | Status: AC
Start: 2023-10-25 — End: ?

## 2023-10-25 MED ORDER — GLUCOSAMINE-CHONDROITIN ORAL
Freq: Two times a day (BID) | ORAL | Status: AC
Start: 2023-10-25 — End: ?

## 2023-10-25 MED ORDER — OCUVITE ORAL
Freq: Every day | ORAL | Status: AC
Start: 2023-10-25 — End: ?

## 2023-10-25 NOTE — Discharge Instructions - Pharmacy (Addendum)
 Your Medication Plan    CONTINUE the following medications as you normally take them AFTER surgery:  Albuterol (Proair) inhaler  Fluticasone (Flonase) nasal spray  Ocuvite multivitamin capsule  Omeprazole (Prilosec) capsule: continue at least once daily for ulcer protection      CONTINUE the following medications, but CRUSH all tablets before taking them for 3 months AFTER surgery:  Atorvastatin (Lipitor) tablet  Montelukast (Singulair) tablet     HOLD the following medications AFTER surgery until seen in clinic:  Glucosamine-chondroitin tablet  Losartan (Cozaar) tablet  Metformin immediate-release tablet  Pioglitazone (Actos) tablet      SWITCH the following medications AFTER surgery:  Metoprolol succinate (Toprol XL): REPLACE with immediate-release version which is safe to crush for 3 months     DISCONTINUE the following medications AFTER surgery:  Insulin NPH (Novolin,Humulin 70/30) currently on 40 units twice daily  Biotin supplement (ProCare has 600 mcg)   Calcium-magnesium-zinc (Contained sufficient in ProCare and Celebrate supplements)  Ibuprofen (Motrin): NSAID  Vitamin E capsule (ProCare has Vitamin E 60 IU)        NEW Medications to start on Discharge:  Prescription:  Ursodiol 300mg  capsules (ONLY if gallbladder in place):   1 capsule by mouth twice daily for 6 months   To prevent gallstones  Acetaminophen (Tylenol) 500mg  tablet extra strength  CRUSH 2 tablets (1000mg ) take by mouth three times daily  For pain, take around the clock initially then transition to as needed for mild pain  Never take more than 4000mg  per day, 8 tablets (maximum daily amount). If you have liver problems, do not take more than 2000mg  per day.  Gabapentin capsule 300mg    2 capsules (600mg ) by mouth nightly for 7 days  For neuropathic pain  Additional Pain medication: To be determined at discharge if needed, you may also receive an "opioid/narcotic" medication for moderate-severe pain unrelieved by acetaminophen and gabapentin.    Please take the minimum amount necessary to control your pain, as these medications are addictive and carry many side effects (drowsiness, dizziness, confusion, slowed breathing, constipation)  Avoid alcohol and driving     Lactulose 08M/57QI oral solution  Take 15 to 30mL (10 to 20g) by mouth daily; may increase to 60mL (40g) daily if needed for constipation  Take daily while on oxycodone or any narcotic pain medications, then as needed for constipation thereafter   HOLD if experiencing loose stools or diarrhea  May mix with fruit juice, water, or milk     Apixaban (BRAND NAME: ELIQUIS) 5 mg tablet  Take 10 mg by mouth twice daily for a total of 7 days (11/08/23-11/14/23), then 5 mg twice daily thereafter (11/15/23 and on)  Spaces doses ideally 12 hours apart  To treat blood clot in your lungs     Metoprolol tartrate immediate-release tablet 25 mg  HALF tablet (12.5 mg) by mouth twice daily  Crush for 3 months  Replaces XL version tablet for 3 months    Insulin glargine subcutaneous injection  Inject 8 units under the skin ONCE DAILY  Replaces insulin NPH 70/30  Please check blood glucose 4x daily and follow up closely with the provider who manages your diabetes medications.       Over-the-counter:   ProCare Bariatric Complete Multivitamin (45mg  iron) capsules:  Take 1 capsule by mouth daily at bedtime for life  No gummy vitamins. They do not contain all the nutrients you need  Take separately from calcium          2.  Calcium Citrate 500mg  + Vitamin D 500unit softchews (Celebrate):   Take 1 softchew by mouth twice daily with breakfast and dinner for life  If you do not eat a diet rich in calcium, take one additional softchew with lunch daily  Take separately from multivitamin     NOTE that these are NOT covered by insurance, over-the-counter (OTC) cost estimates:  Procare multivitamin $13.59  Calcium citrate- vitamin D softchew $33.99     Please be sure to purchase the ProCare Bariatric Complete Multivitamin is  currently available for purchase online at [email or web address] and sometimes [email or web address].     Note that the Celebrate Calcium Citrate with Vitamin D Soft Chew is currently available for purchase online at [email or web address] and sometimes [email or web address]. If you are unable to obtain our preferred regimen, please refer to our alternate regimen in your dietary information packet.     Post Bariatric Surgery Medication Education  1. CRUSH all tablets for 3 months after surgery. May take capsules whole after surgery or OPEN capsules for 3 months after surgery. Please consult with outpatient pharmacist or clinic if starting a new medication during this time to make sure it can be crushed or opened.   2. DISCONTINUE the use of all "NSAID's" (Non-Steroidal Anti-Inflammatory Drugs), including aleve, motrin, ibuprofen, and naproxen. Avoid aspirin unless approved by your surgeon. TYLENOL (acetaminophen) is okay to take for pain. Note for sleeve gastrectomy procedures, NSAID's may be resumed 6 months after surgery at surgeons discretion.  3. DISCONTINUE use of all sleep aid medications (ex. benadryl, ambien) and benzodiazepines (ex. ativan, xanax, valium) WHILE ON PAIN MEDICATIONS. Melatonin is safe.

## 2023-10-25 NOTE — Consults (Addendum)
 [facility]  Pharmacy Consult Note, Post-Operative  Bariatric Surgery Discharge Recommendations    Name: Susan Goodwin  MRN: [id number]  DOB: 06/26/1961    Procedure: RYGB  Surgeon: Dr. Alroy Dust, MD    Summary  I reviewed the patient's home medication list as well as the patient's discharge medication plan (see bariatric clinic pharmacist note). Any changes to the patient's discharge medication plan since were reviewed with the patient and all questions were answered.  Patient reports using NPH 70/30 ~27-37 units twice daily while on liquid diet  Patient states taking the additional medications: ocuvite, biotin, glucosamine/chondroitin, calcium/mag/zinc, flonase, vitamin E  Patient states being Rx furosemide for edema and only took 1 dose before swelling resolved. She self discontinued the remainder 13 doses   Patient states no longer using Ozempic    I evaluated the patient's need for extended VTE prophylaxis on discharge. Based on their calculated risk profile Susan Goodwin Surg 2017;[id number]), the patient qualifies for extended prophylaxis. The recommended regimen is: Enoxaparin 60 mg subcutaneously twice daily for 14 or 28 days. This recommendation will be discussed with the surgery team.     Insurance coverage was determined for the patient's discharge medications and any problems were communicated to the surgery team.    I spent a total of 40 minutes with the patient via telephone and 15 minutes preparing their discharge medications.       Susan Goodwin, PharmD  Clinical Pharmacist, General Surgery  [place]: [phone]    Addendum 11/09/23:    #New PE: diagnosed 11/06/23 on CTPE. Started on apixaban 10 mg BID x7 days through 11/14/23 followed by 5 mg BID 11/15/23 and on. Sent two separate prescriptions for a starter pack and the maintenance dose for insurance purposes as directed by [facility]. Reviewed Eliquis with patient and her son. Discussed s/sx bleeding, regimen, avoid NSAIDs,  don't double up on doses if you forget one.    #DM2: Since her inpatient requirement was 9 units in the past 24 hours, we initially planned to start insulin glargine 9 units for discharge. However, since she has not yet resumed metformin and pioglitazone in the inpatient setting, we will instead have her resume these two oral anti-diabetic medications. Reviewed hypoglycemia and how to treat.  Follow up for further dosage adjustments and/or insulin restart. Endorsed to General Surgery team to have close clinic follow up after discharge to discuss blood sugars.    Susan Goodwin, PharmD  Clinical Pharmacist  [phone]

## 2023-10-29 LAB — HELICOBACTER PYLORI ANTIGEN: H Pylori Ag: NEGATIVE

## 2023-11-02 ENCOUNTER — Inpatient Hospital Stay
Admit: 2023-11-02 | Discharge: 2023-11-11 | Disposition: A | Discharge status: Home Health | Payer: Commercial Managed Care - HMO | Attending: Physician | Admitting: Physician

## 2023-11-02 DIAGNOSIS — E662 Morbid (severe) obesity with alveolar hypoventilation: Secondary | ICD-10-CM

## 2023-11-02 LAB — COMPLETE BLOOD COUNT
Hematocrit: 43.8 % (ref 36.0–46.0)
Hemoglobin: 13.9 g/dL (ref 12.0–15.5)
MCH: 30.9 pg (ref 26.0–34.0)
MCHC: 31.7 g/dL (ref 31.0–36.0)
MCV: 97 fL (ref 80–100)
MPV: 10.2 fL (ref 9.1–12.6)
Platelet Count: 205 10*9/L (ref 140–450)
RBC Count: 4.5 10*12/L (ref 4.00–5.20)
RDW-CV: 12.4 % (ref 11.7–14.4)
WBC Count: 16.1 10*9/L — ABNORMAL HIGH (ref 3.4–10.0)

## 2023-11-02 LAB — POCT GLUCOSE
Glucose, Glucometer: 143 mg/dL (ref 70–199)
Glucose, Glucometer: 192 mg/dL (ref 70–199)

## 2023-11-02 MED ORDER — SODIUM CHLORIDE 0.9 % INTRAVENOUS SOLUTION
0.9 | INTRAVENOUS | Status: DC | PRN
Start: 2023-11-02 — End: 2023-11-02

## 2023-11-02 MED ORDER — FENTANYL (PF) 50 MCG/ML INJECTION SOLUTION
50 | INTRAMUSCULAR | Status: DC | PRN
Start: 2023-11-02 — End: 2023-11-02

## 2023-11-02 MED ORDER — LACTATED RINGERS INTRAVENOUS SOLUTION
INTRAVENOUS | Status: DC
Start: 2023-11-02 — End: 2023-11-04
  Administered 2023-11-03: 15:00:00 50 mL/h via INTRAVENOUS

## 2023-11-02 MED ORDER — PROCHLORPERAZINE EDISYLATE 10 MG/2 ML (5 MG/ML) INJECTION SOLUTION
10 | Freq: Four times a day (QID) | INTRAMUSCULAR | Status: DC | PRN
Start: 2023-11-02 — End: 2023-11-10

## 2023-11-02 MED ORDER — SENNOSIDES 8.6 MG TABLET
8.6 | Freq: Every day | ORAL | Status: DC
Start: 2023-11-02 — End: 2023-11-10
  Administered 2023-11-05 – 2023-11-10 (×5): 17 mg via ORAL

## 2023-11-02 MED ORDER — BUPIVACAINE (PF) 0.25 % (2.5 MG/ML) INJECTION SOLUTION
2.5 | INTRAMUSCULAR | Status: DC | PRN
Start: 2023-11-02 — End: 2023-11-02

## 2023-11-02 MED ORDER — PROPOFOL 10 MG/ML INTRAVENOUS EMULSION FOR OR
10 | Freq: Once | INTRAVENOUS | Status: DC | PRN
Start: 2023-11-02 — End: 2023-11-02
  Administered 2023-11-02: 19:00:00 50 via INTRAVENOUS
  Administered 2023-11-02: 17:00:00 100 via INTRAVENOUS
  Administered 2023-11-02: 16:00:00 140 via INTRAVENOUS
  Administered 2023-11-02 (×3): 40 via INTRAVENOUS

## 2023-11-02 MED ORDER — INSULIN ASPART (U-100) 100 UNIT/ML (3 ML) SUBCUTANEOUS PEN
100 unit/mL (3 mL) | Freq: Three times a day (TID) | SUBCUTANEOUS | Status: DC
Start: 2023-11-02 — End: 2023-11-03

## 2023-11-02 MED ORDER — APREPITANT 40 MG CAPSULE
40 mg | Freq: Once | ORAL | Status: AC
Start: 2023-11-02 — End: 2023-11-02

## 2023-11-02 MED ORDER — HYDROCODONE 7.5 MG-ACETAMINOPHEN 325 MG/15 ML ORAL SOLUTION
7.5-325 | ORAL | Status: DC | PRN
Start: 2023-11-02 — End: 2023-11-02

## 2023-11-02 MED ORDER — DEXTROSE 5 % AND LACTATED RINGERS INTRAVENOUS SOLUTION
INTRAVENOUS | Status: DC
Start: 2023-11-02 — End: 2023-11-02

## 2023-11-02 MED ORDER — SUCCINYLCHOLINE CHLORIDE 200 MG/10 ML (20 MG/ML) INTRAVENOUS SYRINGE
200 | INTRAVENOUS | Status: AC
Start: 2023-11-02 — End: ?

## 2023-11-02 MED ORDER — ATORVASTATIN 20 MG TABLET
20 mg | Freq: Every day | ORAL | Status: DC
Start: 2023-11-02 — End: 2023-11-07
  Administered 2023-11-04 – 2023-11-06 (×3): 20 mg via ORAL

## 2023-11-02 MED ORDER — DEXTROSE 50 % IN WATER (D50W) INTRAVENOUS SYRINGE
INTRAVENOUS | Status: DC | PRN
Start: 2023-11-02 — End: 2023-11-03

## 2023-11-02 MED ORDER — CEFAZOLIN 1 GRAM SOLUTION FOR INJECTION
1 | Freq: Once | INTRAMUSCULAR | Status: DC | PRN
Start: 2023-11-02 — End: 2023-11-02

## 2023-11-02 MED ORDER — SODIUM CHLORIDE 0.9 % (FLUSH) INJECTION SYRINGE
0.9 | INTRAMUSCULAR | Status: DC | PRN
Start: 2023-11-02 — End: 2023-11-10

## 2023-11-02 MED ORDER — FENTANYL (PF) 50 MCG/ML INJECTION SOLUTION
50 | INTRAMUSCULAR | Status: DC | PRN
Start: 2023-11-02 — End: 2023-11-02
  Administered 2023-11-02: 21:00:00 25 ug via INTRAVENOUS

## 2023-11-02 MED ORDER — HEPARIN, PORCINE (PF) 5,000 UNIT/0.5 ML INJECTION SOLUTION
5000 unit/0.5 mL | Freq: Once | INTRAMUSCULAR | Status: AC
Start: 2023-11-02 — End: 2023-11-02

## 2023-11-02 MED ORDER — FENTANYL FOR ANESTHESIA
Freq: Once | Status: DC | PRN
Start: 2023-11-02 — End: 2023-11-02
  Administered 2023-11-02: 19:00:00 25 via INTRAVENOUS
  Administered 2023-11-02 (×2): 50 via INTRAVENOUS
  Administered 2023-11-02: 18:00:00 25 via INTRAVENOUS
  Administered 2023-11-02: 16:00:00 100 via INTRAVENOUS
  Administered 2023-11-02: 19:00:00 50 via INTRAVENOUS

## 2023-11-02 MED ORDER — METOPROLOL SUCCINATE ER 50 MG TABLET,EXTENDED RELEASE 24 HR
50 | Freq: Every evening | ORAL | Status: DC
Start: 2023-11-02 — End: 2023-11-02

## 2023-11-02 MED ORDER — LIDOCAINE 4 % TOPICAL PATCH
4 % | Freq: Every day | TOPICAL | Status: DC
Start: 2023-11-02 — End: 2023-11-05
  Administered 2023-11-02 – 2023-11-04 (×3): 1 via TOPICAL

## 2023-11-02 MED ORDER — OXYCODONE 5 MG/5 ML ORAL SOLUTION
5 | ORAL | Status: DC | PRN
Start: 2023-11-02 — End: 2023-11-02
  Administered 2023-11-02: 20:00:00 5 mg via ORAL

## 2023-11-02 MED ORDER — GLUCOSE 4 GRAM CHEWABLE TABLET
4 | ORAL | Status: DC | PRN
Start: 2023-11-02 — End: 2023-11-03

## 2023-11-02 MED ORDER — METOPROLOL TARTRATE 25 MG TABLET
25 | Freq: Two times a day (BID) | ORAL | Status: DC
Start: 2023-11-02 — End: 2023-11-10
  Administered 2023-11-04 – 2023-11-10 (×13): 12 mg via ORAL

## 2023-11-02 MED ORDER — EPHEDRINE SULFATE 25 MG/5 ML (5 MG/ML) INTRAVENOUS SYRINGE
25 | Freq: Once | INTRAVENOUS | Status: DC | PRN
Start: 2023-11-02 — End: 2023-11-02

## 2023-11-02 MED ORDER — INSULIN ASPART (U-100) 100 UNIT/ML (3 ML) SUBCUTANEOUS PEN
100 | Freq: Every day | SUBCUTANEOUS | Status: DC | PRN
Start: 2023-11-02 — End: 2023-11-03

## 2023-11-02 MED ORDER — MIDAZOLAM 1 MG/ML INJECTION SOLUTION
1 | Freq: Once | INTRAMUSCULAR | Status: DC | PRN
Start: 2023-11-02 — End: 2023-11-02

## 2023-11-02 MED ORDER — SODIUM CHLORIDE 0.9 % (FLUSH) INJECTION SYRINGE
0.9 | Freq: Two times a day (BID) | INTRAMUSCULAR | Status: DC
Start: 2023-11-02 — End: 2023-11-10
  Administered 2023-11-05 – 2023-11-10 (×10): 3 mL via INTRAVENOUS

## 2023-11-02 MED ORDER — LANSOPRAZOLE 30 MG DELAYED RELEASE,DISINTEGRATING TABLET
30 | Freq: Every morning | ORAL | Status: DC
Start: 2023-11-02 — End: 2023-11-10
  Administered 2023-11-04 – 2023-11-10 (×7): 30 mg via ORAL

## 2023-11-02 MED ORDER — FENTANYL (PF) 50 MCG/ML INJECTION SOLUTION
50 | INTRAMUSCULAR | Status: AC
Start: 2023-11-02 — End: ?

## 2023-11-02 MED ORDER — POLYETHYLENE GLYCOL 3350 17 GRAM ORAL POWDER PACKET
17 | Freq: Every day | ORAL | Status: DC | PRN
Start: 2023-11-02 — End: 2023-11-10
  Administered 2023-11-07: 20:00:00 17 g via ORAL

## 2023-11-02 MED ORDER — ROCURONIUM 10 MG/ML INTRAVENOUS SOLUTION
10 | Freq: Once | INTRAVENOUS | Status: DC | PRN
Start: 2023-11-02 — End: 2023-11-02

## 2023-11-02 MED ORDER — LACTULOSE 20 GRAM/30 ML ORAL SOLUTION
20 gram/30 mL | Freq: Once | ORAL | Status: AC
Start: 2023-11-02 — End: 2023-11-03

## 2023-11-02 MED ORDER — GABAPENTIN 300 MG/6 ML (6 ML) ORAL SOLUTION
300 | Freq: Every day | ORAL | Status: DC
Start: 2023-11-02 — End: 2023-11-10
  Administered 2023-11-05 – 2023-11-10 (×6): 600 mg via ORAL

## 2023-11-02 MED ORDER — OXYCODONE 5 MG/5 ML ORAL SOLUTION
5 mg/ mL | Freq: Four times a day (QID) | ORAL | Status: DC | PRN
Start: 2023-11-02 — End: 2023-11-07
  Administered 2023-11-02 – 2023-11-03 (×4): 5 mg via ORAL

## 2023-11-02 MED ORDER — LACTATED RINGERS INTRAVENOUS SOLUTION
INTRAVENOUS | Status: DC
Start: 2023-11-02 — End: 2023-11-02

## 2023-11-02 MED ORDER — LIDOCAINE (PF) 100 MG/5 ML (2 %) INTRAVENOUS SYRINGE
100 | Freq: Once | INTRAVENOUS | Status: DC | PRN
Start: 2023-11-02 — End: 2023-11-02
  Administered 2023-11-02: 16:00:00 100 via INTRAVENOUS

## 2023-11-02 MED ORDER — HYDROMORPHONE (PF) 0.5 MG/0.5 ML INJECTION SYRINGE
0.5 | INTRAMUSCULAR | Status: DC | PRN
Start: 2023-11-02 — End: 2023-11-02
  Administered 2023-11-02: 20:00:00 0 mg via INTRAVENOUS

## 2023-11-02 MED ORDER — ONDANSETRON HCL (PF) 4 MG/2 ML INJECTION SOLUTION
4 | Freq: Once | INTRAMUSCULAR | Status: DC | PRN
Start: 2023-11-02 — End: 2023-11-02

## 2023-11-02 MED ORDER — HEPARIN, PORCINE (PF) 5,000 UNIT/0.5 ML INJECTION SOLUTION
5000 unit/0.5 mL | Freq: Three times a day (TID) | INTRAMUSCULAR | Status: DC
Start: 2023-11-02 — End: 2023-11-04
  Administered 2023-11-04: 13:00:00 5000 [IU] via SUBCUTANEOUS

## 2023-11-02 MED ORDER — METOPROLOL TARTRATE 5 MG/5 ML INTRAVENOUS SOLUTION
5 | Freq: Once | INTRAVENOUS | Status: AC
Start: 2023-11-02 — End: 2023-11-02

## 2023-11-02 MED ORDER — PROCHLORPERAZINE EDISYLATE 10 MG/2 ML (5 MG/ML) INJECTION SOLUTION
10 | Freq: Four times a day (QID) | INTRAMUSCULAR | Status: DC | PRN
Start: 2023-11-02 — End: 2023-11-02

## 2023-11-02 MED ORDER — SUGAMMADEX 100 MG/ML INTRAVENOUS SOLUTION
100 | Freq: Once | INTRAVENOUS | Status: DC | PRN
Start: 2023-11-02 — End: 2023-11-02

## 2023-11-02 MED ORDER — JUICE (FOR HYPOGLYCEMIA)
ORAL | Status: DC | PRN
Start: 2023-11-02 — End: 2023-11-03

## 2023-11-02 MED ORDER — ACETAMINOPHEN 32 MG/ML ORAL LIQUID (WRAPPER)
32 | Freq: Four times a day (QID) | ORAL | Status: DC
Start: 2023-11-02 — End: 2023-11-10
  Administered 2023-11-04 – 2023-11-09 (×11): 1000 mg via ORAL

## 2023-11-02 MED ORDER — ALBUTEROL SULFATE HFA 90 MCG/ACTUATION AEROSOL INHALER
90 | Freq: Four times a day (QID) | RESPIRATORY_TRACT | Status: DC | PRN
Start: 2023-11-02 — End: 2023-11-10

## 2023-11-02 MED ORDER — ONDANSETRON HCL (PF) 4 MG/2 ML INJECTION SOLUTION
4 mg/2 mL | Freq: Four times a day (QID) | INTRAMUSCULAR | Status: DC
Start: 2023-11-02 — End: 2023-11-10
  Administered 2023-11-04 – 2023-11-05 (×3): 4 mg via INTRAVENOUS

## 2023-11-02 MED ORDER — INSULIN ASPART (U-100) 100 UNIT/ML (3 ML) SUBCUTANEOUS PEN
100 unit/mL (3 mL) | Freq: Every day | SUBCUTANEOUS | Status: DC
Start: 2023-11-02 — End: 2023-11-03

## 2023-11-02 MED ORDER — HYDROMORPHONE (PF) 0.5 MG/0.5 ML INJECTION SYRINGE
0.5 | INTRAMUSCULAR | Status: DC | PRN
Start: 2023-11-02 — End: 2023-11-02
  Administered 2023-11-02 (×3): 0 mg via INTRAVENOUS

## 2023-11-02 MED ORDER — BISACODYL 10 MG RECTAL SUPPOSITORY
10 | Freq: Every day | RECTAL | Status: DC | PRN
Start: 2023-11-02 — End: 2023-11-10

## 2023-11-02 MED ORDER — MIDAZOLAM 1 MG/ML INJECTION SOLUTION
1 | INTRAMUSCULAR | Status: AC
Start: 2023-11-02 — End: ?

## 2023-11-02 MED ORDER — LIDOCAINE (PF) 10 MG/ML (1 %) INJECTION SOLUTION
10 | Freq: Once | INTRAMUSCULAR | Status: DC | PRN
Start: 2023-11-02 — End: 2023-11-02

## 2023-11-02 MED ORDER — GABAPENTIN 300 MG CAPSULE
300 mg | Freq: Once | ORAL | Status: AC
Start: 2023-11-02 — End: 2023-11-02

## 2023-11-02 MED ORDER — ACETAMINOPHEN 1,000 MG/100 ML (10 MG/ML) INTRAVENOUS SOLUTION (ONCE)
1000 | Freq: Once | INTRAVENOUS | Status: AC
Start: 2023-11-02 — End: 2023-11-02

## 2023-11-02 MED ORDER — SUCCINYLCHOLINE CHLORIDE 200 MG/10 ML (20 MG/ML) INTRAVENOUS SYRINGE
200 | Freq: Once | INTRAVENOUS | Status: DC | PRN
Start: 2023-11-02 — End: 2023-11-02

## 2023-11-02 MED FILL — METOPROLOL TARTRATE 5 MG/5 ML INTRAVENOUS SOLUTION: 5 5 mg/5 mL | INTRAVENOUS | Qty: 5 | Fill #0

## 2023-11-02 MED FILL — HEPARIN, PORCINE (PF) 5,000 UNIT/0.5 ML INJECTION SOLUTION: 5000 5,000 unit/0.5 mL | INTRAMUSCULAR | Qty: 0.5 | Fill #0

## 2023-11-02 MED FILL — ACETAMINOPHEN 1,000 MG/100 ML (10 MG/ML) INTRAVENOUS SOLUTION: 1000 1,000 mg/100 mL (10 mg/mL) | INTRAVENOUS | Qty: 100 | Fill #0

## 2023-11-02 MED FILL — FENTANYL (PF) 50 MCG/ML INJECTION SOLUTION: 50 50 mcg/mL | INTRAMUSCULAR | Qty: 2 | Fill #0

## 2023-11-02 MED FILL — ONDANSETRON HCL (PF) 4 MG/2 ML INJECTION SOLUTION: 4 4 mg/2 mL | INTRAMUSCULAR | Qty: 2 | Fill #0

## 2023-11-02 MED FILL — GABAPENTIN 300 MG CAPSULE: 300 300 mg | ORAL | Qty: 1 | Fill #0

## 2023-11-02 MED FILL — APREPITANT 40 MG CAPSULE: 40 40 mg | ORAL | Qty: 1 | Fill #0

## 2023-11-02 MED FILL — DILAUDID (PF) 0.5 MG/0.5 ML INJECTION SYRINGE: 0.5 0.5 mg/0.5 mL | INTRAMUSCULAR | Qty: 0.5 | Fill #0

## 2023-11-02 MED FILL — MIDAZOLAM 1 MG/ML INJECTION SOLUTION: 1 1 mg/mL | INTRAMUSCULAR | Qty: 2 | Fill #0

## 2023-11-02 MED FILL — OXYCODONE 5 MG/5 ML ORAL SOLUTION: 5 5 mg/5 mL | ORAL | Qty: 5 | Fill #0

## 2023-11-02 NOTE — Op Note (Signed)
 PREOPERATIVE DIAGNOSIS:   1.  Morbid obesity, Body mass index is 54.34 kg/m.  2. Obesity-associated comorbidities including low back pain, diabetes, dyslipidemia, asthma, hypertension, arthritis, obesity hypoventilation syndrome    POSTOPERATIVE DIAGNOSIS:   1.  Morbid obesity, Body mass index is 54.34 kg/m.  2. Obesity-associated comorbidities including low back pain, diabetes, dyslipidemia, asthma, hypertension, arthritis, obesity hypoventilation syndrome    OPERATION:   1.  Exploratory laparoscopy.  2.  Laparoscopic lysis of adhesions.   3.  Laparoscopic Roux-en-Y gastric bypass for morbid obesity (transoral EEA 25 mm).  4. Surgeon performed TAP (transversus abdominis plane) nerve block to all fascial incisions under direct laparoscopic visualization using 0.25% Marcaine, 20cc.    ANESTHESIA:     General endotracheal anesthesia.    CLINICAL INDICATIONS:   This patient is a 62 year-old female with morbid obesity and associated comorbidities including:  low back pain, diabetes, dyslipidemia, asthma, hypertension, arthritis, obesity hypoventilation syndrome.  She has a Body mass index is 54.34 kg/m.  She has had an extensive bariatric workup preoperatively and has been found to satisfy the [employer] criteria for bariatric surgery.  She presents now for surgical treatment of obesity.  We plan a laparoscopic, possibly open, gastric bypass.      FINDINGS:    100cm roux limb, 25mm EEA stapled antecolic antegastric GJ; JJ and Peterson's defect closed with permanent suture.    DESCRIPTION OF PROCEDURE:   The patient was brought to the operating room and placed supine on the operating room table.  The patient was positioned so as to protect all pressure points with gel and foam padding.  After induction of general endotracheal anesthesia, a Foley catheter and a nasogastric tube were placed.  Pneumatic compression stockings were applied.  The patient received preoperative prophylactic antibiotics.   The patient also received preoperative subcutaneous heparin, 5000 units.  The patient's abdomen was prepped with an iodine solution and sterilely draped.      A left upper quadrant 2 mm incision was created through which a Veress needle was placed into the peritoneal cavity.  Aspiration and drop tests were negative.  Low-flow carbon dioxide insufflation was used with low intra-abdominal pressures being identified, and therefore high-flow carbon dioxide insufflation was used to obtain an intra-abdominal pressure of 15 mmHg.  An 11 mm Optiview trocar was then placed in the midline, 20 cm below the xiphoid process.  Initial videoscopy revealed no evidence of Veress needle or initial trocar placement injury.  Next, a 5 mm and a 12 mm trocar were placed in the left upper quadrant and two 12 mm trocars were placed in the right upper quadrant under direct visualization after local anesthesia with 0.25% Marcaine.  A 5 mm trocar was placed in the subxiphoid space (and in the right upper quadrant as well under direct visualization), after local anesthesia with 0.25% Marcaine.     Multiple intraabdominal adhesions to the patient's abdominal wall were divided, using the Harmonic scalpel.  The omentum was then elevated over the transverse colon.  The ligament of Treitz was identified.  The small bowel was followed for several centimeters distal to the ligament of Treitz, where a site was chosen to transect the proximal jejunum.  A single firing of the tan Endo GIA 60 mm linear stapler was then used to transect the small bowel at this site.  The mesentery was minimally divided using the Harmonic scalpel.  Of note, the mesentery was foreshortened and thickened.  A 100 cm Roux  limb was then created and a jejunojejunostomy was performed with a single firing of the tan Endo GIA 60 mm linear stapling device.  Another tan 60 mm Endo GIA linear stapling device was used to close the stapler insertion site.  Interrupted 2-0 Surgidac  suture was then used to close the mesenteric defect, as well as to fixate the toe of the jejunojejunostomy stapled anastomosis.  The Roux limb was noted to be easily mobile, reaching up to the level of the gastroesophageal junction without tension.     Next, the omentum was divided at the mid-transverse colon up to the level of the colonic wall.  A toothed 5 mm grasper was then used to retract the left lateral segment of the liver anteriorly.  The angle of His was identified and the peritoneal attachments were divided in order to allow adequate exposure of the lesser sac and superior-most portion of the gastric fundus.  Next, the lesser curvature omentum was dissected from the gastric wall, just below the gastrohepatic ligament fat pad, using a Harmonic scalpel.  The lesser sac was entered.  Adhesions were divided in the retrogastric space.  The nasogastric tube was removed.  Multiple firings of the purple 45 mm and 60 mm Endo GIA linear stapler were used to create a 30 cc gastric pouch.  Hemostasis was again ensured.      Next, the EEA 25 mm circular stapler anvil was passed transorally into the newly created gastric pouch and was positioned through the inferior staple line.  The anvil was tacked in place with a pursestring 3-0 silk suture.  The EEA stapler was then passed through the abdominal wall, into the abdominal cavity.  An incision in the Roux limb was created and the EEA stapler was placed into the Roux limb.  The EEA anvil was attached to the EEA stapler, and the 25 mm EEA stapler was fired, creating a gastrojejunal anastomosis.  Once this anastomosis was complete, the stapler was removed from the Roux limb and the abdominal cavity.  The anastomotic rings were evaluated on the back table and noted to be complete circumferentially.  The proximal Roux limb was then closed with a single firing of the tan Endo GIA 45 mm linear stapler.  This remnant was then removed from the abdominal cavity using an Endo  bag.  A selective reinforcement of the gastrojejunal anastomosis was performed with 2-0 Surgidac suture.  Petersons defect and JJ defects were closed with permanent suture. The fascia to the EEA stapler insertion site was then closed with interrupted 0 Vicryl suture.  The final anastomosis was well vascularized, without any tension, and measured approximately 1 cm in diameter.      The abdomen was irrigated with sterile saline and aspirated.  A bowel clamp was placed just distal to the gastrojejunal anastomosis, and using a nasogastric tube replaced at this level, 60 cc of methylene blue were instilled.  No leakage of methylene blue was identified.  Next, the Roux limb was placed in a comfortable manner over the transverse colon and the omentum was situated around the anastomosis.     The abdomen was again irrigated with sterile saline and aspirated. Hemostasis was noted to be excellent. Prior to decompression of the abdomen, surgeon performed TAP (transversus abdominis plane) nerve blocks were performed to the fascial incisions under direct laparoscopic visualization using 0.25% Marcaine, 20cc. Once complete, all trocars were then removed from the abdominal wall under direct visualization, without evidence of abdominal wall bleeding.  All wounds were irrigated with copious amounts of sterile saline, and the skin was closed with subcuticular 4-0 Vicryl suture. The EEA port site skin incision was left partially open with a Penrose wick. Indermil skin glue was then applied to each incision as a skin sealant and fixative dressing.    The patient tolerated the procedure well, and was next awakened from anesthesia and taken to the recovery room in stable condition following completion of this procedure.    ESTIMATED BLOOD LOSS:    Minimal.     COMPLICATIONS:     None.     SPECIMENS TO PATHOLOGY:  None      COUNTS:    Sponge, needle and instrument counts correct.     DISPOSITION:    Postanesthesia Care  Unit.    CONDITION:    Stable.     SURGICAL ATTENDANCE:   ATTENDING SURGEON:   Cherlynn Perches, M.D., F.A.C.S.  ASSISTANT SURGEON(S):  Princella Ion, MD - Minimally Invasive Surgery Fellow       Festus Barren, PA-C - Physician Assistant / Bariatric Program Coordinator    If the assistant surgeon is other than a qualified resident, I certify that the services were medically necessary and there was no qualified resident available to perform the services.    SURGICAL ATTENDANCE:  The attending surgeon, Dr. Sherlynn Carbon, was present and scrubbed for all portions of this procedure.

## 2023-11-02 NOTE — Interval H&P Note (Signed)
 Procedure(s) (LRB):  LAPAROSCOPIC ROUX-EN-Y GASTRIC BYPASS FOR MORBID OBESITY (N/A)      ATTENDING SURGEON PREOPERATIVE NOTE  Hanny Elsberry is a 62 y.o. female with the following pre-op diagnosis: Pre-Op Diagnosis Codes:      * Severe obesity (CMS code) [E66.01]    Surgeons and Role:     * Alroy Dust, MD - Primary    INTERVAL HISTORY AND PHYSICAL  The patient's history and physical were reviewed.  I have performed an appropriate, condition-specific physical examination today, and the indication still exists for surgery. If applicable, diagnostic tests (i.e., labs, pathology reports, radiologic imaging) are available and accessible.    There are no changes in the patients health history, review of systems, or physical findings since the previously recorded history and physical.    Relevant physical exam done: no change    OVERLAPPING OPERATIONS  If I am attending more than one overlapping operation, this surgeon is my backup: n/a

## 2023-11-02 NOTE — H&P (View-Only) (Signed)
 [facility]  Anesthesia Preprocedure Evaluation    Procedure Information       Case: [id number] Date/Time: 11/02/23 0730    Procedure: LAPAROSCOPIC ROUX-EN-Y GASTRIC BYPASS FOR MORBID OBESITY (Abdomen)    Diagnosis: Severe obesity (CMS code) [E66.01]    Pre-op diagnosis: Severe obesity (CMS code) [E66.01]    Location: [facility] / [facility]    Surgeons: Alroy Dust, MD            Precautions            None            Relevant Problems   ANESTHESIA   (+) Obstructive sleep apnea syndrome         Anesthesia Encounter History        CC/HPI/Past Medical History Summary: 63 year old female with PMH of HTN, asthma, OSA, GERD, and DM now scheduled for lap roux en-y bypass for morbid obesity.     (Please refer to APeX Allergies, Problems, Past Medical History, Past Surgical History, Social History, and Family History activities, Results for current data from these respective sections of the chart; these sections of the chart are also summarized in reports, including the Patient Summary Extracts found in Chart Review)      Summary of Outside Records:    09/22/23 -  ECG  NSR     09/22/23 - NM Stress test  1.  Negative study for ischemia or scar.  2.  Overall gated left ventricular systolic function was normal with a calculated LVEF of 66% at stress and 63% at rest.  3.  No left ventricular dilatation     Stress EKG Report  1. The ECG portion of this test is negative for Lexiscan induced ischemia.  2. Scintigraphy (nuclear imaging) is reported separately.          Review of Systems    Physical Exam       Prepare (Pre-Operative Clinic) Assessment/Plan/Narrative      Stop Brennan Bailey information    Anesthesia Assessment and Plan  (See Anesthesia Record for attending attestation)    [Please note, smart link data included in this note may not reflect changes since note creation. Please see appropriate section of APeX for up-to-the minute information.]

## 2023-11-02 NOTE — Anesthesia Post-Procedure Evaluation (Signed)
 Anesthesia Post-op Evaluation    Scheduled date of Operation: 11/02/2023    Scheduled Surgeon(s):  Alroy Dust, MD  Princella Ion, MD  Scheduled Procedure(s):  LAPAROSCOPIC ROUX-EN-Y GASTRIC BYPASS FOR MORBID OBESITY    Final Anesthesia Type: general    Assessment  Respiratory Function:      Airway Patency: Acceptable      Respiratory Rate: See vitals below      SpO2: See vitals below      Overall Respiratory Assessment: Stable  Cardiovascular Function:      Pulse Rate: See Vitals Below      Blood Pressure: See Vitals Below      Cardiac status: Stable  Mental Status:      RASS Score: 0 Alert or calm  Temperature: Normothermic  Pain Control: Adequate  Nausea and Vomiting: Absent  Fluids/Hydration Status: Euvolemic    Complications (anesthesia/case associated complications, possible complications, and/or significant issues; as of time of note completion: No apparent complications      Plan  Follow-up care: As per primary team    Post-op Note Status: Complete, patient participated in evaluation, which occurred after recovery from anesthesia but prior to 48 hours from end of case        Recent Pre-op and Post-op Vital Signs  Vitals:    11/02/23 0640   BP: (!) 141/75   Pulse: 61   Resp: 18   Temp: (!) 35.5 C (95.9 F)   TempSrc: Temporal   SpO2: 94%     Last Vital Signs Out of Room to Anesthesia Stop  Vitals Value Taken Time   Pulse 83 11/02/23 1127   Resp 13 11/02/23 1127   SpO2 91 % 11/02/23 1127   BP 150/72 11/02/23 1126   Arterial Line BP (mmHg)      Arterial Line MAP (mmHg)     Arterial Line 2 BP (mmHg)     Arterial Line 2 MAP (mmHg)     Temp     Temp src     Vitals shown include unfiled device data.    Case Tracking Events:  Event Time In   In SWA - Pre Reg 0533   In Facility 0540   In SWA 0546   In Pre-op 0559   Anesthesia Start 0820   Anesthesia Ready 0829   In Room 0810   Airway-Start 0829   Procedure Start 0856   PACU/Unit Notified 1030   Procedure Finish 1112   Extubation 1119   Out of Room 1120    Anesthesia Finish 1128   In PACU 1122

## 2023-11-02 NOTE — Anesthesia Pre-Procedure Evaluation (Addendum)
 [facility]  Anesthesia Preprocedure Evaluation    Procedure Information       Case: [id number] Date/Time: 11/02/23 0730    Procedure: LAPAROSCOPIC ROUX-EN-Y GASTRIC BYPASS FOR MORBID OBESITY (Abdomen)    Diagnosis: Severe obesity (CMS code) [E66.01]    Pre-op diagnosis: Severe obesity (CMS code) [E66.01]    Location: [facility] /  [facility]    Surgeons: Alroy Dust, MD            Precautions            None            Relevant Problems   ANESTHESIA   (+) Obstructive sleep apnea syndrome         Anesthesia Encounter History        CC/HPI/Past Medical History Summary: 62 year old female with PMH of HTN, asthma, OSA, GERD, and DM now scheduled for lap roux en-y bypass for morbid obesity.     #GERD - omeprazole  #HTN - metop, furosemide, losartan  #T2DM - metformin, Insulin  #Asthma - montelukast, albuterol  #HLD - atorvastatin     (Please refer to APeX Allergies, Problems, Past Medical History, Past Surgical History, Social History, and Family History activities, Results for current data from these respective sections of the chart; these sections of the chart are also summarized in reports, including the Patient Summary Extracts found in Chart Review)      Summary of Outside Records:    09/22/23 -  ECG  NSR     09/22/23 - NM Stress test  1.  Negative study for ischemia or scar.  2.  Overall gated left ventricular systolic function was normal with a calculated LVEF of 66% at stress and 63% at rest.  3.  No left ventricular dilatation     Stress EKG Report  1. The ECG portion of this test is negative for Lexiscan induced ischemia.  2. Scintigraphy (nuclear imaging) is reported separately.    Summary of Prior Anesthetics: Previous anesthetic without AAC          Review of Systems   Constitutional:  Negative for activity change, chills and diaphoresis.   Airway:  Positive for snoring, witnessed apnea and CPAP. Negative for limited neck movement, difficulty opening mouth and TMJ problem  HENT:  Negative.  Negative for loose teeth.   Eyes: Negative.    Cardiovascular: Negative.    Gastrointestinal: Negative.    Genitourinary: Negative.    Obstetric: Negative.   Musculoskeletal:  Positive for neck pain.   Skin: Negative.    Endocrine/Metabolic:   Endo/Heme/Allergy negative    Psychiatric/Behavioral/Developmental: Negative.          Physical Exam    Airway:    Modified Mallampati score: IV. Thyromental distance: 4 cm - 6.5 cm. Mouth opening: good.  Neck range of motion: full.     Constitutional:       Appearance: She is well-developed and well-nourished. She is obese.   HENT:      Right Ear: External ear normal.      Left Ear: External ear normal.      Nose: Nose normal.        Comments: Birthmark, swelling of the upper left lip  Eyes:      Pupils: Pupils are equal, round, and reactive to light.   Neck:   Neck normal  Cardiovascular:   Cardiovascular exam normal.     Rate and Rhythm: Normal rate and regular  rhythm.      Heart sounds: Normal heart sounds.   Pulmonary:   Pulmonary exam normal     Effort: Pulmonary effort is normal.      Breath sounds: Normal breath sounds.   Neurological:      General: Neurological exam grossly intact.  Level of consciousness: alert.     Mental Status: She is oriented to person, place, and time.   Psychiatric:         Behavior: Behavior normal.         Thought Content: Thought content normal.         Judgment: Judgment normal.     Dental:   no poor dentition    The patient has no loose teeth.  She has no chipped or damaged teeth.              Prepare (Pre-Operative Clinic) Assessment/Plan/Narrative  Prepare Clinic consult type: In person  Prepare done at Pre-Op        Obstructive Sleep Apnea Screening  STOPBANG Score:      S Do you Snore loudly (louder than talking or loud  enough to be heard through closed doors)? Yes      T Do you often feel Tired, fatigued, or sleepy during daytime? Yes    O Has anyone Observed you stop breathing during  your sleep? Yes    P Do you have or  are you being treated for high blood Pressure? Yes    B BMI more than 35 KG/m^2? Yes    A Age over 29 years old? Yes    N Neck circumference > 16 inches (40cm)? No    G Gender: Female? No    STOPBANG Total Score 6  Bypassed       Risk Level (based only on STOPBANG) High     Not eligible for complete STOP BANG screening due to: Prescribed CPAP/BiPAP and Known Diagnosis of OSA/At Risk for OSA  CPAP/BiPAP prescribed - Yes.  Using CPAP or BiPAP - No.                Anesthesia Assessment and Plan  Day Of Surgery Provider Chart Review:  NPO status verified  medications reviewed  allergies reviewed  problem list reviewed  anesthesia history reviewed  pertinent labs reviewed  consults reviewed    ASA 3       Anesthesia Plan  Anesthesia Type: general  Pre-medication: acetaminophen  Induction Technique: IntraVenous  Invasive Monitors/Vascular Access: none  Airway Plan: oral ET tube  Possible Advanced Airway Techniques: video laryngoscope  Other Techniques: processed EEG monitoring  Planned Recovery Location: PACU    Blood Product Preparation  Blood Products Plan:  N/A, minimal risk    Anesthesia Potential Complication Discussion  At increased or higher than average risk of: Post-op pulmonary complication, Post-op major adverse cardiac event, Aspiration, Dental damage, Difficult airway, Post-operative intubation, Thrombosis, Unanticipated hospitalization or ICU level care, Death, Emergence agitation, Post-operative nausea and vomiting, Myocardial dysfunction, Eye Injury, Allergic reaction, Back pain, Pain and Sore throat  There is the possibility of rare but serious complications.    Informed Consent for Anesthesia  Consent obtained from patient    Risks, benefits and alternatives including those of invasive monitoring discussed. Increased risks (as above) discussed.  Questions invited and all answered.  Interpreter: N/A - patient/guardian's preferred language is [place]          Consent granted for anesthetic plan    Quality  Measure Documentation  Opioid Therapy Planned? Yes    (See Anesthesia Record for attending attestation)    [Please note, smart link data included in this note may not reflect changes since note creation. Please see appropriate section of APeX for up-to-the minute information.]

## 2023-11-02 NOTE — Brief Op Note (Addendum)
 Brief Operative Note    Surgeons and Role:     * Alroy Dust, MD - Primary     * Princella Ion, MD - Fellow - Assisting    Date of Operation: 11-21-23    Pre-Op Diagnosis Codes:      * Severe obesity (CMS code) [E66.01]    Post-Op Diagnosis Codes:     * Severe obesity (CMS code) [E66.01]    Procedure(s) and Anesthesia Type:     * LAPAROSCOPIC ROUX-EN-Y GASTRIC BYPASS FOR MORBID OBESITY - Anes-General    Implants: * No implants in log *     * No specimens in log *       Findings: 130 cm roux limb, 25mm EEA stapled antecolic antegastric gastrojejunostomy, methylene blue test negative, JJ and Petersen's defect closed with permanent suture    Drains: penrose drain in left lower quadrant incision subcutaneous space    Complications: none    Disposition and Plan: stable, PACU to floor    Estimated Blood Loss: 15 mL      Was VTE prophylaxis ordered? Yes         Were NSAIDS ordered? No (other - please comment)  Reason for not ordering: Contraindicated s/p gastric bypass   Notes:

## 2023-11-02 NOTE — Anesthesia Procedure Notes (Signed)
 Airway  Date/Time: 11/02/2023 8:29 AM  Urgency: elective        Final Airway Details  Final airway type: endotracheal airway      Successful airway: ETT - single  Cuffed: yes   Successful intubation technique: video laryngoscopy - GlideScope  Endotracheal tube insertion site: oral  Blade type: T3.  Blade size: #3  ETT size (mm): 7.0  Cormack-Lehane Classification (DL): grade I - full view of glottis  Placement verified by: chest auscultation, capnometry and chest rise   Inital cuff pressure (cm H2O): 30  Measured from: lips  ETT Depth (cm): 22  Number of attempts at approach: 1  Number of other approaches attempted: 0  Tube passed through cords easily  General Information and Staff  Patient location during procedure: OR  Performed: anesthesia resident     Indications and Patient Condition  Indications for airway management: anesthesia  Sedation level: anesthesia  Preoxygenated: yes  Patient position: sniffing and ramp  Mask difficulty assessment: 0 - not attempted      For full procedure information, see associated anesthesia event/encounter.

## 2023-11-02 NOTE — Progress Notes (Addendum)
SURGERY PROGRESS NOTE     Post-Op Check    Problem-based Assessment & Plan  Susan Goodwin is a 62 y.o. 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 Aundria Rud.    #S/p laparoscopic Roux-En-Y gastric bypass  - 6 hr CBC pending  - Stage I bariatric diet w/ LR at 100 mL/hr  - All tablets crushed. Capsules okay.  - Continue PO APAP, gabapentin, lidocaine patch, and crushed oxycodone. Avoid IV narcotics.  - Scheduled Zofran   - FSBS and RISS  - Lansoprazole  - CPO  - Encourage incentive spirometry  - Lung expansion pathway  - Encourage ambulation  - SCDs  - SQH  - Penrose drain to be removed on the day of discharge  - PT eval  - Abdominal binder    Other chronic medical conditions:    #HTN  - Hold home furosemide and losartan  - Continue metoprolol    #OSA (not on CPAP)  - CPO    #T2DM  - Hold home metformin, Humulin, pioglitazeone  - ISS  - IVF switched to LR given hyperglycemia with D5LR    #HLD  - Continue atorvastatin      24 Hour Course  POD0 s/p laparoscopic Roux-En-Y gastric bypass.    Subjective  Feels "very sleepy"  Pain was bad earlier but it now controlled with medications.  Tolerated sips of water. Denies nausea and vomiting.  Thirsty and would like something to drink.    Vitals  Temp:  [35.5 C (95.9 F)] 35.5 C (95.9 F)  Heart Rate:  [61-80] 79  *Resp:  [11-18] 11  BP: (139-166)/(57-75) 139/57  SpO2:  [92 %-96 %] 93 %    Input / Output  I/O last 2 completed shifts plus current shift:  In: 1750 [I.V.:1750]  Out: 35 [Drains/NG:20; Blood:15]    Physical Exam  Physical Exam  Constitutional:       General: She is not in acute distress.     Comments: Sleepy but easily awakens to voice   HENT:      Head: Normocephalic and atraumatic.   Pulmonary:      Effort: Pulmonary effort is normal. No respiratory distress.   Abdominal:      General: There is no distension.      Palpations: Abdomen is soft.       Tenderness: There is abdominal tenderness (Appropriate post-op tenderness).      Comments: Laparoscopic incisions CDI and OTA. No surrounding erythema, increased warmth, or drainage. LUQ Penrose drain covered with gauze and Tegederm, CDI.    Skin:     General: Skin is warm and dry.   Neurological:      Mental Status: She is oriented to person, place, and time.   Psychiatric:         Mood and Affect: Mood normal.         Behavior: Behavior normal.         Data  CBC        11/02/23  1210   WBC 16.1*   HGB 13.9   HCT 43.8   PLT 205       Radiology Results  No results found.    Nutritional Assessment  Body mass index is 57.07 kg/m. This represents severe obesity per NIH guidelines.    During this hospitalization the patient is also being treated for:  Code Status: FULL            Gaye Alken, NP  [facility]  11/02/23    For established Acute Care Surgery patients, please Voalte "Acute Care Surgery 1st Call [facility]"  For established [facility] general surgery patients, please Voalte "Surgery [facility] 1st Call"  For established [facility] (MIS/ bariatric surgery) patients, please Voalte "Surgery [facility] 1st Call"  For established [facility] (colorectal and surgical oncology) patients, please Voalte "Surgery [facility] 1st Call"  For new [place] surgery consults, please Voalte "Consult Acute Care Surg 1st Call [facility]"       APP Visit Information:   APP Service Type:  Shared

## 2023-11-03 LAB — COMPLETE BLOOD COUNT
Hematocrit: 42.6 % (ref 36.0–46.0)
Hematocrit: 42.6 % (ref 36.0–46.0)
Hemoglobin: 13.8 g/dL (ref 12.0–15.5)
Hemoglobin: 13.5 g/dL (ref 12.0–15.5)
MCH: 31.2 pg (ref 26.0–34.0)
MCH: 31.1 pg (ref 26.0–34.0)
MCHC: 32.4 g/dL (ref 31.0–36.0)
MCHC: 31.7 g/dL (ref 31.0–36.0)
MCV: 96 fL (ref 80–100)
MCV: 98 fL (ref 80–100)
MPV: 10.6 fL (ref 9.1–12.6)
MPV: 10.4 fL (ref 9.1–12.6)
Platelet Count: 204 10*9/L (ref 140–450)
Platelet Count: 170 10*9/L (ref 140–450)
RBC Count: 4.43 10*12/L (ref 4.00–5.20)
RBC Count: 4.34 10*12/L (ref 4.00–5.20)
RDW-CV: 12.5 % (ref 11.7–14.4)
RDW-CV: 12.6 % (ref 11.7–14.4)
WBC Count: 13.9 10*9/L — ABNORMAL HIGH (ref 3.4–10.0)
WBC Count: 9.6 10*9/L (ref 3.4–10.0)

## 2023-11-03 LAB — POCT GLUCOSE
Glucose, Glucometer: 219 mg/dL — ABNORMAL HIGH (ref 70–199)
Glucose, Glucometer: 219 mg/dL — ABNORMAL HIGH (ref 70–199)
Glucose, Glucometer: 187 mg/dL (ref 70–199)
Glucose, Glucometer: 154 mg/dL (ref 70–199)
Glucose, Glucometer: 184 mg/dL (ref 70–199)

## 2023-11-03 LAB — BASIC METABOLIC PANEL (NA, K, CL, CO2, BUN, CR, GLU, CA)
Anion Gap: 6 (ref 4–14)
Calcium, total, Serum / Plasma: 8.6 mg/dL (ref 8.4–10.5)
Carbon Dioxide, Total: 28 mmol/L (ref 22–29)
Chloride, Serum / Plasma: 107 mmol/L (ref 101–110)
Creatinine: 0.72 mg/dL (ref 0.55–1.02)
Glucose, non-fasting: 149 mg/dL (ref 70–199)
Potassium, Serum / Plasma: 3.7 mmol/L (ref 3.5–5.0)
Sodium, Serum / Plasma: 141 mmol/L (ref 135–145)
Urea Nitrogen, serum/plasma: 6 mg/dL — ABNORMAL LOW (ref 7–25)
eGFRcr: 94 mL/min/{1.73_m2} (ref >59)

## 2023-11-03 LAB — MAGNESIUM, SERUM / PLASMA: Magnesium, Serum / Plasma: 2 mg/dL (ref 1.6–2.6)

## 2023-11-03 LAB — PHOSPHORUS, SERUM / PLASMA: Phosphorus, serum/plasma: 3.5 mg/dL (ref 2.3–4.7)

## 2023-11-03 MED ORDER — BACLOFEN 10 MG TABLET
10 | Freq: Three times a day (TID) | ORAL | Status: DC
Start: 2023-11-03 — End: 2023-11-04
  Administered 2023-11-04: 16:00:00 10 mg via ORAL

## 2023-11-03 MED ORDER — DEXTROSE 50 % IN WATER (D50W) INTRAVENOUS SYRINGE
INTRAVENOUS | Status: AC | PRN
Start: 2023-11-03 — End: ?

## 2023-11-03 MED ORDER — INSULIN ASPART (U-100) 100 UNIT/ML (3 ML) SUBCUTANEOUS PEN
100 | Freq: Every day | SUBCUTANEOUS | Status: AC | PRN
Start: 2023-11-03 — End: ?

## 2023-11-03 MED ORDER — IPRATROPIUM 0.5 MG-ALBUTEROL 3 MG (2.5 MG BASE)/3 ML NEBULIZATION SOLN
0.5 | Freq: Three times a day (TID) | RESPIRATORY_TRACT | Status: DC
Start: 2023-11-03 — End: 2023-11-07
  Administered 2023-11-04 – 2023-11-07 (×10): 3 mL via RESPIRATORY_TRACT

## 2023-11-03 MED ORDER — GLUCOSE 4 GRAM CHEWABLE TABLET
4 | ORAL | Status: AC | PRN
Start: 2023-11-03 — End: ?

## 2023-11-03 MED ORDER — INSULIN ASPART (U-100) 100 UNIT/ML (3 ML) SUBCUTANEOUS PEN
100 | Freq: Every day | SUBCUTANEOUS | Status: AC
Start: 2023-11-03 — End: ?
  Administered 2023-11-04 – 2023-11-10 (×6): 0 [IU] via SUBCUTANEOUS

## 2023-11-03 MED ORDER — JUICE (FOR HYPOGLYCEMIA)
ORAL | Status: AC | PRN
Start: 2023-11-03 — End: ?

## 2023-11-03 MED ORDER — INSULIN ASPART (U-100) 100 UNIT/ML (3 ML) SUBCUTANEOUS PEN
100 | Freq: Three times a day (TID) | SUBCUTANEOUS | Status: AC
Start: 2023-11-03 — End: ?
  Administered 2023-11-04 – 2023-11-05 (×4): 3 [IU] via SUBCUTANEOUS
  Administered 2023-11-05 – 2023-11-06 (×2): 2 [IU] via SUBCUTANEOUS
  Administered 2023-11-06 (×2): 3 [IU] via SUBCUTANEOUS
  Administered 2023-11-07: 03:00:00 6 [IU] via SUBCUTANEOUS
  Administered 2023-11-07 – 2023-11-09 (×7): 3 [IU] via SUBCUTANEOUS
  Administered 2023-11-09: 20:00:00 9 [IU] via SUBCUTANEOUS
  Administered 2023-11-10 (×2): 3 [IU] via SUBCUTANEOUS
  Administered 2023-11-10: 01:00:00 2 [IU] via SUBCUTANEOUS

## 2023-11-03 MED FILL — HEPARIN, PORCINE (PF) 5,000 UNIT/0.5 ML INJECTION SOLUTION: 5000 unit/0.5 mL | INTRAMUSCULAR | Qty: 0.5

## 2023-11-03 MED FILL — ONDANSETRON HCL (PF) 4 MG/2 ML INJECTION SOLUTION: 4 4 mg/2 mL | INTRAMUSCULAR | Qty: 2

## 2023-11-03 MED FILL — ATORVASTATIN 20 MG TABLET: 20 mg | ORAL | Qty: 1

## 2023-11-03 MED FILL — ACETAMINOPHEN 650 MG/20.3 ML ORAL SOLUTION: 650 650 mg/20.3 mL | ORAL | Qty: 40.6 | Fill #0

## 2023-11-03 MED FILL — ONDANSETRON HCL (PF) 4 MG/2 ML INJECTION SOLUTION: 4 4 mg/2 mL | INTRAMUSCULAR | Qty: 2 | Fill #0

## 2023-11-03 MED FILL — HEPARIN, PORCINE (PF) 5,000 UNIT/0.5 ML INJECTION SOLUTION: 5000 5,000 unit/0.5 mL | INTRAMUSCULAR | Qty: 0.5

## 2023-11-03 MED FILL — BACLOFEN 10 MG TABLET: 10 10 mg | ORAL | Qty: 1

## 2023-11-03 MED FILL — OXYCODONE 5 MG/5 ML ORAL SOLUTION: 5 mg/ mL | ORAL | Qty: 5

## 2023-11-03 MED FILL — ACETAMINOPHEN 650 MG/20.3 ML ORAL SOLUTION: 650 650 mg/20.3 mL | ORAL | Qty: 40.6

## 2023-11-03 MED FILL — PROCHLORPERAZINE EDISYLATE 10 MG/2 ML (5 MG/ML) INJECTION SOLUTION: 10 10 mg/2 mL (5 mg/mL) | INTRAMUSCULAR | Qty: 2

## 2023-11-03 MED FILL — PROCHLORPERAZINE EDISYLATE 10 MG/2 ML (5 MG/ML) INJECTION SOLUTION: 10 10 mg/2 mL (5 mg/mL) | INTRAMUSCULAR | Qty: 2 | Fill #0

## 2023-11-03 MED FILL — LIDOCAINE 4 % TOPICAL PATCH: 4 4 % | TOPICAL | Qty: 1

## 2023-11-03 MED FILL — INSULIN ASPART (U-100) 100 UNIT/ML (3 ML) SUBCUTANEOUS PEN: 100 100 unit/mL (3 mL) | SUBCUTANEOUS | Qty: 3 | Fill #0

## 2023-11-03 MED FILL — METOPROLOL TARTRATE 25 MG TABLET: 25 mg | ORAL | Qty: 1

## 2023-11-03 MED FILL — LANSOPRAZOLE 30 MG DELAYED RELEASE,DISINTEGRATING TABLET: 30 mg | ORAL | Qty: 1

## 2023-11-03 MED FILL — SENNA LAX 8.6 MG TABLET: 8.6 8.6 mg | ORAL | Qty: 2 | Fill #0

## 2023-11-03 MED FILL — GABAPENTIN 300 MG/6 ML (6 ML) ORAL SOLUTION: 300 300 mg/6 mL (6 mL) | ORAL | Qty: 12 | Fill #0

## 2023-11-03 MED FILL — LACTULOSE 20 GRAM/30 ML ORAL SOLUTION: 20 20 gram/30 mL | ORAL | Qty: 30

## 2023-11-03 MED FILL — OXYCODONE 5 MG/5 ML ORAL SOLUTION: 5 5 mg/5 mL | ORAL | Qty: 5

## 2023-11-03 NOTE — Consults (Signed)
 PHYSICAL THERAPY INITIAL EVALUATION    PT relevant HPI  Susan Goodwin is a 62 y.o. 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 Aundria Rud.    ASSESSMENT   Pt presents with PT impairments/problems in: aerobic capacity/ endurance, ventilation/ gas exchange, strength, balance, pain  Resulting in: functional decline from baseline, physical assist needed for mobility, inability to perform prior life roles, risk for falls  In addition to baseline impairments include: sedentary lifestyle, mobility limitations, deconditioning  Addition baseline impairments/limitations detail: chronic knee pain and degenerative joint disease  Factors facilitating functional progress: patient motivation, social support  Complicating factors for functional progress or safe discharge: co-morbidities, baseline deficits  Anticipate patient can progress to: ambulate HH distances with LRAD and mod I    RECOMMENDATIONS  DISCHARGE RECOMMENDATION  Comments Home with home PT      Patient Current Functional Status Sufficient for PT Discharge Recommendation No   Discharge DME needs tbd owns SPC x 2 and 4WW   Discharge transportation needs Private car   Rehab Potential Patient participates well in therapy and progressing towards goals     NURSING RECOMMENDATIONS  Inpatient Rehab Assistive Device Recommendation Walker   Activity Recommendation OOB to chair and walk TID, may liekly need NRB mask     PRIOR LEVEL OF FUNCTION  Prior Living Environment: Lives with spouse, no STE  Available equipment or existing home modifications: 4WW and BL canes  Prior functional limitations: mod I within home and limited community distance with 4WW vs BL canes. Mod I for ADLs  Social supports: support from spouse  Community access: Family/friends  Occupation(s), roles and routines, exercise habits:    Fall history: Fall History: No  Fall history details: denies  falls.    SUBJECTIVE  Subjective report:  reports "I feel tired"  Patient goals:  return to PLF  Notable observations:  Left seated in chair, resting on 6L NPO2, spouse present    SYSTEMS REVIEW  Cognition/Communication  Delirium screen: Yes Delirium Screen:   Details:  no evidence of    Cognition/communication impaired: Yes        Behavior: Anxious  Integumentary  Integumentary deficits: Yes  Integumentary deficit detail:  Lap site x 4  Cardiopulmonary  Cardiopulmonary deficits: Yes  Detail:  HTN, asthma, OSA (not on CPAP)  Musculoskeletal  Musculoskeletal deficits: Musculoskeletal deficits noted: Yes  Abnormal strength findings: strength at least 3/5, weak hip abductors  Abnormal range of motion findings: limited knee ROM from chronic pain and Degenerative join disease  Neuromuscular  Neuromuscular deficits: No    Pain     Currently in pain: Yes  Pain location: reports 10/10 pain in lap sites, RN aware, pre-medicated          FUNCTIONAL MOBILITY  Brace/Orthotic/Prosthetic:  Yes  Type of brace/orthotic: abd binder for comfort  Level of assist with don/doff of brace/orthotics: total assist    Precautions/WB status: Yes  Precautions and weight bearing status comments: soft abd guidelines      Hemodynamic response:   Normal hemodynamic response: No  Response:  Hypoxemia  Comments: desat with ambulation to 79% on 6L NPO2, recovers in sitting and coaching for deep breathing to 90%. Ambulated back to room x 20 feet with 15L NRB mask, no desaturation noted. Reviewed ICS    Requires second person/additional health care providers: No    Bed mobility  Activity  Level  of assist Current Device   Rolling     Supine < > Sit  Moderate assist  Through long sitting;Increased time   Rolling:    Supine< >Sit: Comments: HOB fully elevated    Transfer  Activity  Level of assist Current Device Technique   Sit < > Stand  Minimal assist  Front wheeled walker    Transfer  From:  To:      Sit < > Stand: Intervention: Verbal cues, Tactile  cues, Comments: cueing for hand placement with FWW  Transfer:      Locomotion  Activity  Level of assist Current Device Distance/Steps   Gait  Contact guard assist  Front wheeled walker  20' x 2 with seated rest break.   Stairs      Gait: Comments: significant trendelenberg with decreased knee flexion in swing phase of gait.  decreased cadence overall with increased relience on UE support  Stairs:        Balance  Balance deficits noted: YesFunctional Balance for ADLs  Position Static Dynamic   Sit  -      -        Stand Static Standing level of assist: Supervision-     Dynamic Standing level of assist: Contact guard assist-          Exercise/other interventions  Other exercise: edu on soft abd guidelines, Discussed ICS       Education assessment  Learner: Patient  Content: Plan of care, Activity recommendations  Response: Verbalizes understanding, Needs reinforcement    Communication between other health care providers: RN  Communication comment: desaturation    Outcome measures   Physical Therapist Global Assessment of Mobility  Activity Achieved: Walking/Wheelchair 5-265ft with any level of assistance, with or without assist device  AMPAC 6-clicks basic mobility score: 18        PLAN  PT frequency:  4x/week  PT duration:  4 weeks.  Comment: POC comment: POC expires 11/30/22    GOALS  Flowsheet Rows      Flowsheet Row Most Recent Value   Patient will perform supine< >sitting with mod I via log roll   Patient will perform bed to chair transfers with mod I and LRAD   Patient will ambulate x 600' with LRAD and mod I   Patient will demonstrate understanding of Pacing strategies, Home activity program, Precautions for mobility   Patient/cargiver will be independent Walking program          Planned PT interventions:   Specific interventions: Progressive functional mobility training;Gait training;Balance training;NM Re-ed;Aerobic training;Ther ex  Education interventions: Education interventions : Mobility with precautions  compliance;Fall risk reduction;Exercise program;Self-pacing/breathing;Benefits of activity;Caregiver training  Comment:              Lazaro Arms, PT    11/03/2023

## 2023-11-03 NOTE — Plan of Care (Signed)
 Problem: Discharge Planning - Adult  Goal: Knowledge of and participation in plan of care  Outcome: Progress within 12 hours     Problem: Activity Intolerance - Abdominal / GI / GU Surgery Patient - Adult  Goal: Improved activity tolerance ( return to admit level or baseline )  Outcome: Progress within 12 hours  Goal: Able to perform physical activity as ordered  Outcome: Progress within 12 hours     Problem: Airway, Ineffective - Abdominal / GI / GU Surgery Patient - Adult  Goal: Patent airway / effective airway clearance  Outcome: Progress within 12 hours     Problem: Nausea/Vomiting - Abdominal / GI / GU Surgery Patient - Adult  Goal: Absence of nausea and vomiting  Outcome: Progress within 12 hours     Problem: Nutrition, Alteration in - Abdominal / GI / GU Surgery Patient - Adult  Goal: Adequate nutritional intake  Outcome: Progress within 12 hours  Goal: Maximize nutritional intake per patient condition  Outcome: Progress within 12 hours     Problem: Fall, at Risk or Actual - Adult  Goal: Absence of falls and fall related injury  Outcome: Progress within 12 hours  Goal: Knowledge of fall prevention  Outcome: Progress within 12 hours

## 2023-11-03 NOTE — Consults (Signed)
 [employer]UTRITION [employer] features editor, Registered (DTR) attempted to complete a comprehensive nutrition screen via in person visit, but unable to complete due to  patient in too much pain for education today .     DTR will attempt visit again tomorrow, 11/04/23.     Arlyce Harman, DTR

## 2023-11-03 NOTE — Progress Notes (Addendum)
SURGERY PROGRESS NOTE     Problem-based Assessment & Plan  Susan Goodwin is a 62 y.o. 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 Aundria Rud.    #S/p laparoscopic Roux-En-Y gastric bypass  - Stage II bariatric diet w/ LR at 50 mL/hr  - All tablets crushed. Capsules okay.  - Continue PO APAP, gabapentin, lidocaine patch, added baclofen, and crushed oxycodone. Avoid IV narcotics.  - Scheduled Zofran   - FSBS and RISS  - Lansoprazole  - Encourage ambulation  - SCDs  - SQH  - Penrose drain to be removed on the day of discharge  - PT eval  - Abdominal binder    Other chronic medical conditions:    #HTN  - Hold home furosemide and losartan  - Continue metoprolol    #OSA (not on CPAP)  #Respiratory Insufficiency requiring supplemental oxygen  #Acute Pulmonary Edema  - CPO  - supplemental oxygen to keep O2 sat >92%  - CXR 12/11 w/ pulmonary edema and low lung volumes  - IVF decreased to 50cc/hr  - Encourage incentive spirometry  - Lung expansion pathway    #T2DM  - Hold home metformin, Humulin, pioglitazeone  - ISS Resistant for glucose >180  - IVF switched to LR given hyperglycemia with D5LR    #HLD  - Continue atorvastatin      24 Hour Course  POD 1 s/p laparoscopic Roux-En-Y gastric bypass.  Increasing oxygen requirement, CXR w/ pulmonary edema    Subjective  Feeling sleepy, having pain and medication is hardly taking the edge off  Denies nausea or vomiting    Vitals  Temp:  [36.5 C (97.7 F)-37.1 C (98.8 F)] 36.7 C (98.1 F)  Heart Rate:  [64-80] 71  *Resp:  [11-18] 18  BP: (115-166)/(55-75) 117/57  SpO2:  [92 %-100 %] 93 %    Input / Output  I/O last 2 completed shifts plus current shift:  In: 3318.33 [P.O.:200; I.V.:3118.33]  Out: 1335 [Urine:1300; Drains/NG:20; Blood:15]    Physical Exam  Physical Exam  Vitals reviewed.   Constitutional:       General: She is not in acute distress.      Appearance: She is obese. She is not ill-appearing.      Comments: Sleepy but easily awakens to voice   Cardiovascular:      Rate and Rhythm: Normal rate.   Pulmonary:      Effort: Pulmonary effort is normal. No respiratory distress.      Comments: 4L NC 93%  Abdominal:      General: There is no distension.      Palpations: Abdomen is soft.      Tenderness: There is abdominal tenderness (Appropriate post-op tenderness).      Comments: Laparoscopic incisions CDI and OTA. No surrounding erythema, increased warmth, or drainage. Mild bruising. LUQ Penrose drain covered with gauze and Tegederm, CDI.    Skin:     General: Skin is warm and dry.   Neurological:      Mental Status: She is alert and oriented to person, place, and time.   Psychiatric:         Mood and Affect: Mood normal.         Behavior: Behavior normal.         Data  CBC        11/02/23  1745 11/02/23  1210  WBC 13.9* 16.1*   HGB 13.8 13.9   HCT 42.6 43.8   PLT 204 205       Radiology Results  No results found.    Nutritional Assessment  Body mass index is 57.07 kg/m. This represents severe obesity per NIH guidelines.    During this hospitalization the patient is also being treated for:   - Diabetes type 2, SSI changed to resistant    - Pulmonary edema: acute, CXR, dec IVF, LEP/ICS, supplemental oxygen, CPO       Code Status: FULL    The patient and plans were discussed with the MIS Fellow Dr. Luella Cook and Attending Dr. Aundria Rud.    Bard Herbert, NP  [facility]  11/03/23     For new general surgery consults at Center, please Voalte "Consult Acute Care Surgery 1st Call [facility]"  For established Acute Care Surgery patients, please Voalte "Acute Care Surgery 1st Call [facility]"  For established [facility] patients, please Voalte "Surgery [facility] 1st Call"  For established [facility] (MIS/bariatric surgery) patients, please Voalte "Surgery [facility] 1st Call"   For established [facility] (colorectal and surgical oncology) patients, please Voalte "Surgery [facility] 1st Call"      APP Visit Information:   APP Service Type:  Shared    The patient did not require critical care.    I spent a total non-overlapping time of 25 minutes in this episode of care preparing to see the patient, obtaining and/or reviewing separately obtained history, performing a medically appropriate examination and/or evaluation, counseling and educating patient/family/caregiver, ordering and reviewing medications, tests, or procedures, referring and communicating with other health care professionals, documenting clinical information in the electronic or other health record, and communicating results to the patient/family/caregiver, and/or other care coordination.

## 2023-11-03 NOTE — Consults (Signed)
 Respiratory Care Consult for Airway Clearance Technique/Lung Expansion Technique Pathway  Initial    Identified Pathway: LET Pathway  ACT Pathway:    LET Pathway: PAP    Frequency: QID/PRN      Primary pulmonary status/diagnosis: Suspected or impending atelectasis (LET Pathway)  Contraindications: NO: Evaluate for frequency of technique(s)  Chest X-ray: Consolidation or collapse in TWO quadrant of the same lung (low lung volumes)  Right breath sounds: Diminished  Left breath sounds: Diminished  Respirations: 18  Respiratory pattern: Regular  Dyspnea Assessment (Revised Borg): Nothing at all  Cough: Non-productive  SpO2: 94 %  Level of consciousness: Awake, Lethargic  Level of activity: In bed  Smoking history: Less than or equal to 1 pk/day  Surgical history (Present Admission): Lower abdominal  Total points: 16    Alyxis Grippi N [id number], RCP  Time spent: 15 mins

## 2023-11-04 LAB — BASIC METABOLIC PANEL (NA, K, CL, CO2, BUN, CR, GLU, CA)
Anion Gap: 7 (ref 4–14)
Calcium, total, Serum / Plasma: 8.3 mg/dL — ABNORMAL LOW (ref 8.4–10.5)
Carbon Dioxide, Total: 27 mmol/L (ref 22–29)
Chloride, Serum / Plasma: 105 mmol/L (ref 101–110)
Creatinine: 0.68 mg/dL (ref 0.55–1.02)
Glucose, non-fasting: 220 mg/dL — ABNORMAL HIGH (ref 70–199)
Potassium, Serum / Plasma: 3.6 mmol/L (ref 3.5–5.0)
Sodium, Serum / Plasma: 139 mmol/L (ref 135–145)
Urea Nitrogen, serum/plasma: 9 mg/dL (ref 7–25)
eGFRcr: 98 mL/min/{1.73_m2} (ref >59)

## 2023-11-04 LAB — COMPLETE BLOOD COUNT
Hematocrit: 39.1 % (ref 36.0–46.0)
Hemoglobin: 12.5 g/dL (ref 12.0–15.5)
MCH: 31.1 pg (ref 26.0–34.0)
MCHC: 32 g/dL (ref 31.0–36.0)
MCV: 97 fL (ref 80–100)
MPV: 10.5 fL (ref 9.1–12.6)
Platelet Count: 119 10*9/L — ABNORMAL LOW (ref 140–450)
RBC Count: 4.02 10*12/L (ref 4.00–5.20)
RDW-CV: 12.4 % (ref 11.7–14.4)
WBC Count: 8.8 10*9/L (ref 3.4–10.0)

## 2023-11-04 LAB — PROTHROMBIN TIME
INR: 1.2 (ref 0.9–1.2)
PT: 14.5 s (ref 11.6–14.8)

## 2023-11-04 LAB — POCT GLUCOSE
Glucose, Glucometer: 189 mg/dL (ref 70–199)
Glucose, Glucometer: 159 mg/dL (ref 70–199)
Glucose, Glucometer: 163 mg/dL (ref 70–199)
Glucose, Glucometer: 193 mg/dL (ref 70–199)

## 2023-11-04 LAB — PHOSPHORUS, SERUM / PLASMA: Phosphorus, serum/plasma: 1.8 mg/dL — ABNORMAL LOW (ref 2.3–4.7)

## 2023-11-04 LAB — MAGNESIUM, SERUM / PLASMA: Magnesium, Serum / Plasma: 1.9 mg/dL (ref 1.6–2.6)

## 2023-11-04 MED ORDER — FUROSEMIDE 20 MG TABLET
20 | Freq: Once | ORAL | Status: AC
Start: 2023-11-04 — End: 2023-11-04
  Administered 2023-11-04: 16:00:00 20 mg via ORAL

## 2023-11-04 MED ORDER — POTASSIUM, SODIUM PHOSPHATES 280 MG-160 MG-250 MG ORAL POWDER PACKET
280-160-250 | Freq: Two times a day (BID) | ORAL | Status: AC
Start: 2023-11-04 — End: 2023-11-05
  Administered 2023-11-05 (×2): 500 via ORAL

## 2023-11-04 MED ORDER — HEPARIN, PORCINE (PF) 5,000 UNIT/0.5 ML INJECTION SOLUTION
5000 | Freq: Three times a day (TID) | INTRAMUSCULAR | Status: DC
Start: 2023-11-04 — End: 2023-11-06
  Administered 2023-11-05 – 2023-11-07 (×6): 5000 [IU] via SUBCUTANEOUS

## 2023-11-04 MED ORDER — FLUTICASONE PROPIONATE 50 MCG/ACTUATION NASAL SPRAY,SUSPENSION
50 | Freq: Every day | NASAL | Status: AC
Start: 2023-11-04 — End: ?
  Administered 2023-11-05 – 2023-11-10 (×7): 1 via NASAL

## 2023-11-04 MED ORDER — SODIUM CHLORIDE 0.65 % NASAL SPRAY AEROSOL
0.65 | Freq: Four times a day (QID) | NASAL | Status: AC | PRN
Start: 2023-11-04 — End: ?
  Administered 2023-11-05: 13:00:00 1 via NASAL

## 2023-11-04 MED ORDER — ENOXAPARIN 60 MG/0.6 ML SUBCUTANEOUS SYRINGE
60 | Freq: Two times a day (BID) | SUBCUTANEOUS | Status: DC
Start: 2023-11-04 — End: 2023-11-04
  Administered 2023-11-04: 16:00:00 60 mg via SUBCUTANEOUS

## 2023-11-04 MED FILL — HEPARIN, PORCINE (PF) 5,000 UNIT/0.5 ML INJECTION SOLUTION: 5000 5,000 unit/0.5 mL | INTRAMUSCULAR | Qty: 0.5

## 2023-11-04 MED FILL — BACLOFEN 10 MG TABLET: 10 10 mg | ORAL | Qty: 1

## 2023-11-04 MED FILL — ACETAMINOPHEN 650 MG/20.3 ML ORAL SOLUTION: 650 mg/20.3 mL | ORAL | Qty: 40.6

## 2023-11-04 MED FILL — HEPARIN, PORCINE (PF) 5,000 UNIT/0.5 ML INJECTION SOLUTION: 5000 unit/0.5 mL | INTRAMUSCULAR | Qty: 0.5

## 2023-11-04 MED FILL — ONDANSETRON HCL (PF) 4 MG/2 ML INJECTION SOLUTION: 4 4 mg/2 mL | INTRAMUSCULAR | Qty: 2

## 2023-11-04 MED FILL — METOPROLOL TARTRATE 25 MG TABLET: 25 mg | ORAL | Qty: 1

## 2023-11-04 MED FILL — ACETAMINOPHEN 650 MG/20.3 ML ORAL SOLUTION: 650 650 mg/20.3 mL | ORAL | Qty: 40.6

## 2023-11-04 MED FILL — SENNA LAX 8.6 MG TABLET: 8.6 mg | ORAL | Qty: 2

## 2023-11-04 MED FILL — GABAPENTIN 300 MG/6 ML (6 ML) ORAL SOLUTION: 300 300 mg/6 mL (6 mL) | ORAL | Qty: 12

## 2023-11-04 MED FILL — FUROSEMIDE 20 MG TABLET: 20 20 mg | ORAL | Qty: 1

## 2023-11-04 MED FILL — ATORVASTATIN 20 MG TABLET: 20 mg | ORAL | Qty: 1

## 2023-11-04 MED FILL — BACLOFEN 10 MG TABLET: 10 mg | ORAL | Qty: 1

## 2023-11-04 MED FILL — LIDOCAINE 4 % TOPICAL PATCH: 4 % | TOPICAL | Qty: 1

## 2023-11-04 MED FILL — IPRATROPIUM 0.5 MG-ALBUTEROL 3 MG (2.5 MG BASE)/3 ML NEBULIZATION SOLN: 0.5 0.5 mg-3 mg(2.5 mg base)/3 mL | RESPIRATORY_TRACT | Qty: 3

## 2023-11-04 MED FILL — IPRATROPIUM 0.5 MG-ALBUTEROL 3 MG (2.5 MG BASE)/3 ML NEBULIZATION SOLN: 0.5 mg-3 mg(2.5 mg base)/3 mL | RESPIRATORY_TRACT | Qty: 3

## 2023-11-04 MED FILL — METOPROLOL TARTRATE 25 MG TABLET: 25 25 mg | ORAL | Qty: 1

## 2023-11-04 MED FILL — ENOXAPARIN 60 MG/0.6 ML SUBCUTANEOUS SYRINGE: 60 60 mg/0.6 mL | SUBCUTANEOUS | Qty: 0.6

## 2023-11-04 MED FILL — LANSOPRAZOLE 30 MG DELAYED RELEASE,DISINTEGRATING TABLET: 30 30 mg | ORAL | Qty: 1

## 2023-11-04 NOTE — Progress Notes (Signed)
SURGERY PROGRESS NOTE     Problem-based Assessment & Plan  Susan Goodwin is a 62 y.o. 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 Aundria Rud.    #S/p laparoscopic Roux-En-Y gastric bypass  - Stage II bariatric diet w/ LR at 50 mL/hr  - All tablets crushed. Capsules okay.  - Continue PO APAP, gabapentin, lidocaine patch, added baclofen, and crushed oxycodone. Avoid IV narcotics.  - Scheduled Zofran   - FSBS and RISS  - Lansoprazole  - Encourage ambulation  - SCDs  - SQH  - Penrose drain to be removed on the day of discharge  - PT eval  - Abdominal binder  - Enoxaparin 60 mg BID x 14 days on discharge    #OSA (not on CPAP)  #Hypoxic respiratory insufficiency, likely chronic, likely in part due to obesity hypoventilation syndrome, requiring supplemental oxygen, ICD 10 G47.35   #Acute pulmonary edema  - CPO  - Supplemental oxygen to keep O2 sat >92%  - CXR 12/11 w/ pulmonary edema and low lung volumes  - SLIV  - Lasix 20 mg IV x 1  - Strict I&O  - Encourage incentive spirometry  - Lung expansion pathway  - Ambulatory O2 studies for anticipated discharge home with O2  SpO2 on room air at rest: 81%  SpO2 96% on 6LNC  SpO2 with ambulation on room air: 79%  SpO2 while ambulating on room air on 6 LPM of oxygen improves to: 90%    #HTN  - Hold home furosemide and losartan  - Continue metoprolol    #T2DM  - Hold home metformin, Humulin, pioglitazeone  - ISS Resistant for glucose >180  - IVF switched to LR given hyperglycemia with D5LR    #HLD  - Continue atorvastatin      24 Hour Course  CXR w/ pulmonary edema and low lung volumes. IVF decreased to 50 mL/hr  Started on resistant SSI for glucose >180  Advanced to a stage II bariatric diet  Overnight, stable on 6LNC    Subjective  Uses her CPAP intermittently at home. Per husband, she hardly ever uses it.  Pain improved  No nausea, did well with liquids  No  chest pain. No shortness of breath with ambulation.    Vitals  Temp:  [36.5 C (97.7 F)-37.1 C (98.8 F)] 36.6 C (97.9 F)  Heart Rate:  [72-98] 92  *Resp:  [16-21] 18  BP: (105-146)/(51-77) 133/55  SpO2:  [81 %-97 %] 94 %    Input / Output  I/O last 2 completed shifts plus current shift:  In: 2025 [P.O.:640; I.V.:1385]  Out: 500 [Urine:500]    Physical Exam  Physical Exam  Vitals reviewed.   Constitutional:       General: She is not in acute distress.     Appearance: She is obese. She is not ill-appearing.   Cardiovascular:      Rate and Rhythm: Normal rate.   Pulmonary:      Effort: Pulmonary effort is normal. No respiratory distress.      Comments: 6L NC 94%  Abdominal:      General: There is no distension.      Palpations: Abdomen is soft.      Tenderness: There is abdominal tenderness (Appropriate post-op tenderness).      Comments: Laparoscopic incisions glued and open to air. No surrounding erythema, increased warmth, or drainage.  No surrounding erythema, increased warmth, or drainage. Mild bruising. LUQ Penrose drain covered with gauze and Tegederm, CDI.    Skin:     General: Skin is warm and dry.   Neurological:      Mental Status: She is alert and oriented to person, place, and time.   Psychiatric:         Mood and Affect: Mood normal.         Behavior: Behavior normal.         Data  CBC        11/03/23  0718 11/02/23  1745 11/02/23  1210   WBC 9.6 13.9* 16.1*   HGB 13.5 13.8 13.9   HCT 42.6 42.6 43.8   PLT 170 204 205       Radiology Results  No results found.    Nutritional Assessment  Body mass index is 57.07 kg/m. This represents severe obesity per NIH guidelines.    During this hospitalization the patient is also being treated for:   - Diabetes type 2, SSI changed to resistant    - Pulmonary edema: acute, CXR, dec IVF, LEP/ICS, supplemental oxygen, CPO       Code Status: FULL    This note represents the assessment and plan performed and formulated by the rounding surgical team.      Lilly Cove,  NP  General Surgery  11/04/23      For new general surgery consults at [facility], please Voalte "Consult Acute Care Surgery 1st Call [facility]"  For established Acute Care Surgery patients, please Voalte "Acute Care Surgery 1st Call [facility]"  For established [facility] patients, please Voalte "Surgery [facility] 1st Call"  For established [facility] (MIS/bariatric surgery) patients, please Voalte "Surgery [facility] 1st Call" or "Surgery [facility] APP"  For established [facility] (colorectal and surgical oncology) patients, please Voalte "Surgery [facility] 1st Call"    APP Visit Information:   APP Service Type:  Shared    The patient did not require critical care.    I spent a total non-overlapping time of 50 minutes in this episode of care preparing to see the patient, obtaining and/or reviewing separately obtained history, performing a medically appropriate examination and/or evaluation, counseling and educating patient/family/caregiver, ordering and reviewing medications, tests, or procedures, referring and communicating with other health care professionals, documenting clinical information in the electronic or other health record, and communicating results to the patient/family/caregiver, and/or other care coordination.    MDM Complexity Data  No additional complexity of data reviewed  No additional complexity of data    MDM Complexity of Problems  No additional complexity of problems    MDM Complexity Risk  No additional complexity risk

## 2023-11-04 NOTE — Consults (Signed)
Follow Up Respiratory Care Consult for Airway Clearance Technique/Lung Expansion Technique Pathway    Identified Pathway: LET Pathway  LET Pathway: PAP    Frequency: TID&PRN      Primary pulmonary status/diagnosis: Suspected or impending atelectasis (LET Pathway)  Contraindications: NO: Evaluate for frequency of technique(s)  Chest X-ray: (Bilateral) consolidation or collapse in TWO quadrant  Right breath sounds: Clear, Diminished  Left breath sounds: Clear, Diminished  Respirations: 20  Respiratory pattern: Regular  Dyspnea Assessment (Revised Borg): Nothing at all  Cough: Non-productive  SpO2: 97 %  Level of consciousness: Awake, Alert  Level of activity: Chair  Smoking history: Less than or equal to 1 pk/day  Surgical history (Present Admission): Lower abdominal  Total points: 10    Susan Goodwin, RCP  Time spent: 15 min

## 2023-11-04 NOTE — Consults (Signed)
NUTRITION SERVICES EDUCATION    Patient History:    Susan Goodwin is a 62 y.o. female.     Nutrition Interventions and Plan of Care:  Nutrition Education:   Education on nutrition's influence on health (Bariatric Diet)  Met with patient and significant other to review the following:  Learner had not reviewed nutrition education video(s) prior to visit. Provided link for reference after discharge.  --> Goal of calorie restricted diet, 60 to 80 grams protein of low-fat selections.  --> Provided written guidelines of meal progression from clear liquid diet in the hospital with transition to thicker liquids, high protein meals after discharge with gradual increase to pureed soft foods as tolerated.  --> Avoid concentrated sweets and sweetened beverages to minimize risk of dumping syndrome  --> Reviewed the following bariatric vitamin/mineral regimen:   [X]  Option #1: ProCare MVI  ProCare Bariatric Complete Chewable or Capsule Multivitamin with Minerals (1 tab, daily) PO.   Celebrate Calcium Citrate + Vitamin D Soft Chew (500 mg Ca + 500 units vitamin D chew, BID) PO.  []  Option #2: Alternate regimen  Flintstones Chewable Multivitamin (2 tabs, daily) PO.   Calcium citrate + Vitamin D (315 mg + 250 units, 2 tabs, BID) crushed PO.   Ferrous sulfate (325 mg, daily) PO.  Ascorbic acid (500 mg, daily) PO.  Vitamin B12 [phone] mcg, daily) sublingual.  --> Fluid goal of ~2 liters per day to prevent dehydration  --> Discussed bariatric plate method to be followed for life after diet advancement.  --> Follow up with outpatient dietitian and support group recommended.    Materials provided: Bariatric Diet Progression Post Surgery", "Bariatric Vitamin and Mineral Recommendations", "Bariatric Plate Method", "[place] Bariatric Surgery Flyer"    Readiness to change nutrition related behavior: Preparation stage  Barriers to learning: Other  Stated preference to learning: Unable to assess  Method of teaching: Explanation and  Handout       Nutrition Monitoring/Evaluation:  Nutrition Goal Indicator: Food and nutrition knowledge  Nutrition Goal Criteria: Patient can teach back main principles of Bariatric education provided.  Nutrition Goal Progress: New goal identified    Arlyce Harman, DTR

## 2023-11-04 NOTE — Interdisciplinary (Signed)
CASE MANAGEMENT INITIAL ASSESSMENT     Admitting Diagnosis: Severe obesity (CMS code) [E66.01]  S/P gastric bypass [Z98.84]    62 y.o. 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 Aundria Rud.     Payor: Advertising copywriter HMO/SENIOR / Plan: [employer] [facility] HMO SNR / Product Type: Medicare Advantage SR HMO /      CM Adult Assessment - 11/04/23 1439          Screening    Any anticipated barriers to discharge? No     Does patient have any discharge planning needs? Yes     Expected Discharge Disposition Home Health Services        Language Assistant    Interpreter Needed? No        Adult Assessment    Transfer from outside facility No     Patient has an inpatient admission at any facility within last 30 days? No     Does the Patient have a PCP? Yes     PCP name and contact information Oralia Rud, DO        Caregiver and Family Support    Source of Information Spouse/partner     Patient/Parent/Legal Guardian is Decision Maker Yes        Living Arrangements and Services    *Who does the patient live with? Spouse/Partner     *Patient's Current Residence Apartment/ House     Address Type Permanent     *Support Systems Spouse/Partner     *Current Services in Games developer (DME)     Current DME Cane;Wheelchair        Functional Status    Prior ADLs Needs Assistance     Current ADLs Needs Assistance     Prior Mental Status Alert, oriented     Current Mental Status Alert, oriented     Special Learning Needs No identified special learning needs        Proposed Discharge Plan    Patient/family/caregiver's readiness, willingness, and ability to provide or support self-management activities confirmed Yes     Home Discharge Destination Facesheet address     Discharge to street, car, or other non-permanent shelter No     Comfort care/Inpatient Hospice No     Anticipated Skilled or Acute needs DME;Physical Therapy     DME  Status Vendor confirmed     Proposed Transportation Arrangements Self/Family/Caregiver (no assistance needed)                   NURSING AND PHYSICAL THERAPY INFORMATION    AM-PAC Mobility 6 Clicks Score: 18   Physical Therapy Discharge Recommendation: Home with home PT   Rehab Assistive Device Recommendation: Walker   Discharge transportation requirements: Private car     CONTINUED CARE and SERVICES COORDINATION  I called pt and her husband answered the ph at bs. He states pt is sleeping.    HH and O2 referral started on AS.    Dixie Dials  11/04/2023

## 2023-11-04 NOTE — Nursing Note (Addendum)
Oxygen Testing for Continuous Home Oxygen above 4L    Oxygen Saturation awake, at Rest, with 0L of Oxygen: 83  Oxygen Saturation awake, at Rest, with 6L of Oxygen: 96  Oxygen Saturation on 0L of Oxygen during Ambulation: 79  Oxygen Saturation while ambulating on 6L or greater of Oxygen improves to: 90

## 2023-11-05 DIAGNOSIS — E662 Morbid (severe) obesity with alveolar hypoventilation: Secondary | ICD-10-CM

## 2023-11-05 LAB — COMPLETE BLOOD COUNT
Hematocrit: 37.8 % (ref 36.0–46.0)
Hemoglobin: 12 g/dL (ref 12.0–15.5)
MCH: 30.8 pg (ref 26.0–34.0)
MCHC: 31.7 g/dL (ref 31.0–36.0)
MCV: 97 fL (ref 80–100)
MPV: 10.4 fL (ref 9.1–12.6)
Platelet Count: 109 10*9/L — ABNORMAL LOW (ref 140–450)
RBC Count: 3.9 10*12/L — ABNORMAL LOW (ref 4.00–5.20)
RDW-CV: 12.4 % (ref 11.7–14.4)
WBC Count: 6.3 10*9/L (ref 3.4–10.0)

## 2023-11-05 LAB — BASIC METABOLIC PANEL (NA, K, CL, CO2, BUN, CR, GLU, CA)
Anion Gap: 9 (ref 4–14)
Calcium, total, Serum / Plasma: 8.4 mg/dL (ref 8.4–10.5)
Carbon Dioxide, Total: 28 mmol/L (ref 22–29)
Chloride, Serum / Plasma: 106 mmol/L (ref 101–110)
Creatinine: 0.67 mg/dL (ref 0.55–1.02)
Glucose, non-fasting: 179 mg/dL (ref 70–199)
Potassium, Serum / Plasma: 3.6 mmol/L (ref 3.5–5.0)
Sodium, Serum / Plasma: 143 mmol/L (ref 135–145)
Urea Nitrogen, serum/plasma: 6 mg/dL — ABNORMAL LOW (ref 7–25)
eGFRcr: 99 mL/min/{1.73_m2} (ref >59)

## 2023-11-05 LAB — POCT GLUCOSE
Glucose, Glucometer: 155 mg/dL (ref 70–199)
Glucose, Glucometer: 130 mg/dL (ref 70–199)
Glucose, Glucometer: 157 mg/dL (ref 70–199)
Glucose, Glucometer: 137 mg/dL (ref 70–199)

## 2023-11-05 LAB — MAGNESIUM, SERUM / PLASMA: Magnesium, Serum / Plasma: 2 mg/dL (ref 1.6–2.6)

## 2023-11-05 LAB — PHOSPHORUS, SERUM / PLASMA: Phosphorus, serum/plasma: 2.8 mg/dL (ref 2.3–4.7)

## 2023-11-05 MED ORDER — FUROSEMIDE 10 MG/ML INJECTION SOLUTION
10 | Freq: Once | INTRAMUSCULAR | Status: AC
Start: 2023-11-05 — End: 2023-11-05
  Administered 2023-11-05: 19:00:00 20 mg via INTRAVENOUS

## 2023-11-05 MED ORDER — POTASSIUM, SODIUM PHOSPHATES 280 MG-160 MG-250 MG ORAL POWDER PACKET
280-160-250 | Freq: Three times a day (TID) | ORAL | Status: AC
Start: 2023-11-05 — End: 2023-11-06
  Administered 2023-11-06: 02:00:00 250 via ORAL

## 2023-11-05 MED ORDER — FUROSEMIDE 10 MG/ML INJECTION SOLUTION
10 | Freq: Once | INTRAMUSCULAR | Status: AC
Start: 2023-11-05 — End: 2023-11-05
  Administered 2023-11-06: 01:00:00 20 mg via INTRAVENOUS

## 2023-11-05 MED ORDER — LIDOCAINE 4 % TOPICAL PATCH
4 % | Freq: Every day | TOPICAL | Status: DC
Start: 2023-11-05 — End: 2023-11-10
  Administered 2023-11-05 – 2023-11-10 (×6): 2 via TOPICAL

## 2023-11-05 MED FILL — ACETAMINOPHEN 650 MG/20.3 ML ORAL SOLUTION: 650 650 mg/20.3 mL | ORAL | Qty: 40.6

## 2023-11-05 MED FILL — IPRATROPIUM 0.5 MG-ALBUTEROL 3 MG (2.5 MG BASE)/3 ML NEBULIZATION SOLN: 0.5 0.5 mg-3 mg(2.5 mg base)/3 mL | RESPIRATORY_TRACT | Qty: 3

## 2023-11-05 MED FILL — DEEP SEA NASAL 0.65 % SPRAY AEROSOL: 0.65 % | NASAL | Qty: 44

## 2023-11-05 MED FILL — SENNA LAX 8.6 MG TABLET: 8.6 8.6 mg | ORAL | Qty: 2

## 2023-11-05 MED FILL — METOPROLOL TARTRATE 25 MG TABLET: 25 25 mg | ORAL | Qty: 1

## 2023-11-05 MED FILL — LIDOCAINE 4 % TOPICAL PATCH: 4 4 % | TOPICAL | Qty: 2

## 2023-11-05 MED FILL — ONDANSETRON HCL (PF) 4 MG/2 ML INJECTION SOLUTION: 4 4 mg/2 mL | INTRAMUSCULAR | Qty: 2

## 2023-11-05 MED FILL — PHOS-NAK 280 MG-160 MG-250 MG ORAL POWDER PACKET: 280-160-250 mg | ORAL | Qty: 1

## 2023-11-05 MED FILL — BACLOFEN 10 MG TABLET: 10 10 mg | ORAL | Qty: 1

## 2023-11-05 MED FILL — HEPARIN, PORCINE (PF) 5,000 UNIT/0.5 ML INJECTION SOLUTION: 5000 5,000 unit/0.5 mL | INTRAMUSCULAR | Qty: 0.5

## 2023-11-05 MED FILL — IPRATROPIUM 0.5 MG-ALBUTEROL 3 MG (2.5 MG BASE)/3 ML NEBULIZATION SOLN: 0.5 mg-3 mg(2.5 mg base)/3 mL | RESPIRATORY_TRACT | Qty: 3

## 2023-11-05 MED FILL — PHOS-NAK 280 MG-160 MG-250 MG ORAL POWDER PACKET: 280-160-250 mg | ORAL | Qty: 2

## 2023-11-05 MED FILL — ATORVASTATIN 20 MG TABLET: 20 mg | ORAL | Qty: 1

## 2023-11-05 MED FILL — GABAPENTIN 300 MG/6 ML (6 ML) ORAL SOLUTION: 300 mg/6 mL (6 mL) | ORAL | Qty: 12

## 2023-11-05 MED FILL — LANSOPRAZOLE 30 MG DELAYED RELEASE,DISINTEGRATING TABLET: 30 mg | ORAL | Qty: 1

## 2023-11-05 MED FILL — FUROSEMIDE 10 MG/ML INJECTION SOLUTION: 10 mg/mL | INTRAMUSCULAR | Qty: 2

## 2023-11-05 MED FILL — PHOS-NAK 280 MG-160 MG-250 MG ORAL POWDER PACKET: 280-160-250 280-160-250 mg | ORAL | Qty: 1

## 2023-11-05 MED FILL — FLUTICASONE PROPIONATE 50 MCG/ACTUATION NASAL SPRAY,SUSPENSION: 50 mcg/actuation | NASAL | Qty: 16

## 2023-11-05 NOTE — Plan of Care (Signed)
Problem: Discharge Planning - Adult  Goal: Knowledge of and participation in plan of care  Outcome: Progress within 12 hours     Problem: Nutrition, Alteration in - Abdominal / GI / GU Surgery Patient - Adult  Goal: Adequate nutritional intake  Outcome: Progress within 12 hours     Problem: Fall, at Risk or Actual - Adult  Goal: Absence of falls and fall related injury  Outcome: Progress within 12 hours     Problem: Nausea/Vomiting - Abdominal / GI / GU Surgery Patient - Adult  Goal: Absence of nausea and vomiting  Outcome: Adequate for Discharge

## 2023-11-05 NOTE — Consults (Signed)
PHYSICAL THERAPY DISCHARGE SUMMARY    PT relevant HPI  Susan Goodwin is a 62 y.o. 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 Aundria Rud.       ASSESSMENT  Pt agreeable and motivated, moving significantly better than last visit. requires CG-SV overall with FWW. gait x 250' with FWW on 6L NPO2. Discussed pacing as well as Energy conservation techniques, Self assessment of Dyspnea and use of pulse ox. Reviewed abd guidelines. Pt is cleared and safe for DC home with HHPT and support from family. no PT needs    Focus next session:      RECOMMENDATIONS  DISCHARGE RECOMMENDATION  Comments Home with home PT  cleared for home with spouse   Patient Current Functional Status Sufficient for PT Discharge Recommendation Yes   Discharge DME needs tbd owns SPC x 2 and 4WW   Discharge transportation needs Private car   Rehab Potential Patient participates well in therapy and progressing towards goals     NURSING RECOMMENDATIONS  Inpatient Rehab Assistive Device Recommendation Walker   Activity Recommendation OOB to chair and walk TID     SUBJECTIVE  Subjective report:  "I feel good"  Notable observations:  Left seated in chair, resting on 3L NPO2, spouse present    SYSTEMS REVIEW  Unremarkable since the last full evaluation or reevaluation.    COMPREHENSIVE MOVEMENT ANALYSIS/TREATMENT  Brace/Orthotic/Prosthetic:  Yes  Type of brace/orthotic: abd binder for comfort  Level of assist with don/doff of brace/orthotics: total assist    Precautions/WB status: Yes  Precautions and weight bearing status comments: soft abd guidelines      Hemodynamic response:   Normal hemodynamic response: Yes  Response:     Comments: VSS throughout. O2sat > 92% on 6L NPO2      Functional Mobility  Requires second person/additional health care providers: No    Transfer Current Initial   Sit < > Stand  Level of assist  Supervision  Minimal assist  (11/03/23 1400)   Device  Front wheeled walker  Front wheeled walker (11/03/23 1400)   Intervention  Verbal cues;Tactile cues  Verbal cues;Tactile cues (11/03/23 1400)   Transfer  Level of assist  From:  To:     Device     Intervention     Sit < > Stand comments:  cueing for hand placement with FWW      Gait Current Initial   Level of assist  Supervision  Contact guard assist (11/03/23 1400)   Device  Front wheeled walker  Front wheeled walker (11/03/23 1400)   Intervention     Distance  250'  20' x 2 with seated rest break. (11/03/23 1400)   Gait comments:  significant trendelenberg with decreased knee flexion in swing phase of gait.  decreased cadence overall with increased relience on UE support    Balance  Balance deficits noted: Yes  Functional Balance for ADLs  Position Static Dynamic   Sit  -      -        Stand Static Standing level of assist: Supervision-     Dynamic Standing level of assist: Contact guard assist-        Exercise/other interventions  Other exercise: edu on soft abd guidelines, Discussed ICS    Communication between other health care providers: Communication between other health care provider: RN  Communication comment:  Education Publishing copy: Patient  Content: Plan of care, Activity recommendations  Response: Verbalizes understanding, Needs reinforcement  Outcome measures   Physical Therapist Global Assessment of Mobility  Activity Achieved: Walking/Wheelchair 201-461ft with any level of assistance, with or without assist device  AMPAC 6-clicks basic mobility score: 18      PLAN  Plan of care status:  Discontinue inpatient physical therapy  PT Reason for discontinuation: Inpatient PT needs met;Safe with mobility  Discontinuation comment:        Planned PT interventions:   Specific interventions: Progressive functional mobility training;Gait training;Balance training;NM Re-ed;Aerobic training;Ther ex  Education interventions: Mobility with precautions compliance;Fall risk  reduction;Exercise program;Self-pacing/breathing;Benefits of activity;Caregiver training  Comment:          Lazaro Arms, PT    11/05/2023

## 2023-11-05 NOTE — Discharge Instructions (Addendum)
 Post-Operative Instructions for Your Care at Home after Bariatric Surgery      The name of your operation is Rou-en-Y gastric bypass. The name of your surgeon is Alroy Dust, MD     If you need urgent help, call [phone]. This number is always answered. However, if you need non-urgent assistance or have questions related to your surgery or condition, please send your surgeon a [facility] MyChart message.    Contact your surgeon if you develop any of the following:   abdominal pain that becomes much worse than when you left the hospital and does not improve with pain medication and does not improve with pain medication  the area around your incisions becomes very red, increasingly tender, or begins to drain pus (small amounts of blood-tinged fluid and mild redness are common and are no cause for concern)  your temperature goes above 100.4 F (38 C)  persistent vomiting or diarrhea  you feel as if you are getting more sick    You needed supplemental oxygen and Lasix while in the hospital for Hypoxic respiratory insufficiency and pulmonary edema. You will go home with oxygen and your primary care doctor will wean you off of it. You will have home health RN, PT/OT.    Medications & Pain Control  When you leave the hospital, you will be given prescriptions for several medications, some of which you will only take for a few months and others will be lifelong. These medications must be taken as prescribed and should be started within one to two days of arriving home. Please discuss the medications with your pharmacy before your operation to confirm that these are covered by your insurance or be prepared to pay for them yourself. Resume your usual medicines for chronic medical conditions, being sure to crush the larger solid pills. Please consult a pharmacist regarding which medications, especially larger solid pills, can be crushed.     You will be prescribed a medicine to help with pain control. Over time you will  need less and less of the medicine, and you should gradually reduce the amount you take in the weeks after surgery. Do not take any form of non-steroidal anti-inflammatory drugs (NSAIDS) such as ibuprofen (Advil).     Immediately following surgery and for at least the following 6 months, you will be prescribed a medicine that decreases acid production in your stomach to decrease the risk of ulcers.  This medication is usually a proton pump inhibitor (PPI) such as omeprazole (Prilosec) capsules 20mg  daily. Your surgeon may recommend that you continue using an acid reduction medication for the rest of your life.    If you still have a gallbladder, you will be prescribed a medicine called Ursodiol (or equivalent) to help prevent the formation of gallstones. The dose is 300mg  twice a day.  This should be taken for 6 months after surgery.    If you are at high risk for, or have a history of blood clots in your legs or lungs, you may be given a prescription for a medication to prevent clot formation called Lovenox. If prescribed, this should be taken for 3-6 months after surgery    Vitamins and Minerals are Required Daily After Surgery   ProCare Bariatric Complete Multivitamin (45mg  iron) capsule or chewtab: Take 1 capsule or chewtab by mouth daily at bedtime.   Celebrate Calcium Citrate 500mg  + Vitamin D 500unit softchew: Take 1 softchew by mouth twice daily with breakfast and dinner.   If you  do not eat a diet rich in calcium, take one additional softchew with lunch daily.   Note that the ProCare Bariatric Complete Multivitamin is currently available for purchase online at [email or web address] and [email or web address]. Celebrate Calcium Citrate/Vit D softchews are available online at [email or web address] and [email or web address]. If you are unable to obtain our preferred regimen, please refer to our alternate regimen in your dietary information packet.     Activity  It is common to feel a little more tired than usual in the first couple of  weeks after surgery. This is to be expected, and you should listen to your body to determine the appropriate level of activity.  You may return to full activity, including work, when you feel ready. After a laparoscopic operation, this usually occurs within two weeks.  If you have laparoscopic incisions, you may lift as much as you wish and return to more vigorous activities, such as sports as soon as you feel up to it.  You may perform normal activities, such as walking up and down stairs, walking outside the house, doing chores about the house and riding in a car when you feel up to it.  You may drive after one week, as long as you are not taking any pain medication.    Bowel Movements  You will likely have irregular bowel movements after bariatric surgery, but they will normalize over time.  You will be given lactulose solution at discharge, which should be taken in the first 1-2 weeks after surgery to prevent post-op constipation.  It is especially important to take while on oxycodone (or any narcotic pain medication) to prevent opioid-induced constipation.  Take 15 to 30mL (10 to 20g) by mouth daily; may increase to 60mL (40g) daily if needed for constipation.  HOLD if experiencing loose stools or diarrhea.  Lactulose can be mixed with a zero-calorie drink (Gatorade zero, vitamin zero, etc), water, or milk.   If you have not had a bowel movement 4 days past your surgery despite taking lactulose, we recommend taking a dose of Milk of Magnesia. This will help for acute constipation by speeding up bowel motility.  To prevent constipation, we recommend adding powder fiber (Benefiber or Metamucil) with increased fluid intake or smooth move tea.      Wound Care  You may take showers after your operation. Let the soap and water run over your incisions. If you had a gastric bypass, expect some drainage where the drain was removed.  This is normal and will resolve after a few days.    Pregnancy and Contraception  We  recommend you do not become pregnant for at least 12 months after bariatric surgery. In general, oral contraception ("birth control pills") can be resumed a month after surgery. The reason we recommend waiting a month before taking your birth control pills is because 1) birth control pills can increase your risk of developing a blood clot, and 2) medications given during your surgery can affect how well the birth control works.  If you are sexually active, we recommend an IUD or abstinence for the first month after surgery. After that, an IUD or birth control pills are recommended for the first year.  Finally, we do not recommend condoms, cervical cap, and other barrier techniques, as they are not reliable enough to use in the first year after bariatric surgery to use for contraception.    Approach to Diet After Your Operation  The diet guidelines are designed to limit calories while providing a balanced meal  plan to help prevent nutrient deficiencies and preserve muscle tissue. Tolerance and progression will vary from patient to patient. Some general guidelines are:   Eat slowly and chew small bites of food thoroughly.  When you first start taking solids, avoid rice, bread, raw vegetables, and meats that are not easily chewed such as pork and steak. Ground meats are usually better tolerated.  Eat balanced meals with small portions.  Keep a daily record of your food portions, plus calories and protein intake.  Concentrate on following a diet low in calories and sweets.  Avoid sugar, sugar-containing foods and beverages, concentrated sweets, and fruit juices.  Preserve muscle tissue by eating foods rich in protein. High protein foods are eggs, meats, fish, seafood, tuna, poultry, soy milk, tofu, cottage cheese, yogurt, and other milk products. Your goal should be a minimum of 60-80 grams of protein per day. Do not worry if you cannot reach this goal in the first couple of months after your operation.    Fluids  Drink  extra water and low calorie or calorie-free fluids between meals to avoid dehydration. Sip about one cup of fluid between each small meal, 6 to 8 times a day. At least two liters (64 ounces or 8 cups) of fluid a day is recommended. You will gradually be able to meet this target.  We strongly warn against the use of any alcoholic beverages. Alcohol will get absorbed into your system much more quickly than before, and the sedative and mood-altering affects are more difficult to predict and control.    Diet Progression After Bariatric Surgery  Please reference our "Diet after Bariatric Surgery" education guide for detailed directions regarding diet advancement after surgery.    Follow-up Contacts  Your first postoperative appointment should be scheduled within 1-3 weeks of leaving the hospital. If you do not have an appointment scheduled, send a message through MyChart to your surgeon's office. You can also call [phone]  It is recommended you meet regularly with a dietitian and join a support group in your community to help with your lifestyle adjustments.     Post Operative Follow-up Appointments & Labs  You will be seen by a member of our bariatric team via video visit.  If you wish to have a follow-up appointment with the dietitian, please let us know so the visit can be scheduled accordingly:  1-3 weeks after discharge  3 months post-op  6 months post-op  12 months post-op  every year thereafter    Monitoring lab values is important to avoid complications of malnutrition. Labs should start with the 3 months visit and be drawn about 2 weeks before your appointment so the results can be discussed with your surgeon.  If you are getting your labs drawn at your local hospital, bring the printed results with you to your appointment.  These labs are recommended:  Complete blood count  Complete metabolic panel  Lipid Panel  Iron panel  Folate, thiamine, copper, iron, ferritin, selenium, zinc, vitamin A, D,  B12  Hemoglobin A1C  PTH

## 2023-11-05 NOTE — Progress Notes (Signed)
SURGERY PROGRESS NOTE     Problem-based Assessment & Plan  Susan Goodwin is a 62 y.o. 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 Aundria Rud. Hospital course notable for acute pulmonary edema requiring diuresis and hypoxic respiratory insufficiency requiring supplemental oxygen.    #S/p laparoscopic Roux-En-Y gastric bypass  - Stage II bariatric diet w/ LR at 50 mL/hr  - All tablets crushed. Capsules okay.  - Continue PO APAP, gabapentin, lidocaine patch, dc'd baclofen d/t blurry vision, and crushed oxycodone. Avoid IV narcotics.  - Scheduled Zofran   - FSBS and RISS  - Lansoprazole  - Encourage ambulation  - SCDs  - SQH  - Penrose drain to be removed on the day of discharge  - PT eval  - Abdominal binder  - Enoxaparin 60 mg BID x 14 days on discharge    #OSA (not on CPAP)  #Hypoxic respiratory insufficiency, likely chronic, likely in part due to obesity hypoventilation syndrome, requiring supplemental oxygen, ICD 10 G47.35   #Acute pulmonary edema  - CPO  - Supplemental oxygen to keep O2 sat >92%  - CXR 12/11 w/ pulmonary edema and low lung volumes  - SLIV  - Lasix 20 mg IV x 1  - Strict I&O  - Encourage incentive spirometry  - Lung expansion pathway  - Ambulatory O2 studies for anticipated discharge home with O2  SpO2 on room air at rest: 81%  SpO2 96% on 6LNC  SpO2 with ambulation on room air: 79%  SpO2 while ambulating on room air on 6 LPM of oxygen improves to: 90%    #HTN  - Hold home furosemide and losartan  - Continue metoprolol    #T2DM  - Hold home metformin, Humulin, pioglitazeone  - ISS Resistant for glucose >180  - IVF switched to LR given hyperglycemia with D5LR    #HLD  - Continue atorvastatin      24 Hour Course  O2 sat- stable on 6L NC.   Lasix 20 mg x 1.   CM c/s re: Home O2.   BM x1.   Minor Nosebleed last night, resolved.   Blurred vision due to baclofen, dc'd  baclofen    Subjective  Uses her CPAP intermittently at home. Per husband, she hardly ever uses it.  Pain improved  No nausea, did well with liquids  No chest pain. No shortness of breath with ambulation.    Vitals  Temp:  [36.5 C (97.7 F)-37.1 C (98.8 F)] 36.8 C (98.2 F)  Heart Rate:  [75-90] 90  *Resp:  [16-20] 18  BP: (124-152)/(58-77) 134/58  SpO2:  [78 %-97 %] 95 %    Input / Output  I/O last 2 completed shifts plus current shift:  In: 940 [P.O.:940]  Out: 2200 [Urine:2050; Stool:150]    Physical Exam  Physical Exam  Vitals reviewed.   Constitutional:       General: She is not in acute distress.     Appearance: She is obese. She is not ill-appearing.   Cardiovascular:      Rate and Rhythm: Normal rate.   Pulmonary:      Effort: Pulmonary effort is normal. No respiratory distress.      Comments: 3L NC 96%  Abdominal:      General: There is no distension.      Palpations: Abdomen is soft.      Tenderness: There is  abdominal tenderness (Appropriate post-op tenderness).      Comments: Laparoscopic incisions glued and open to air. No surrounding erythema, increased warmth, or drainage. No surrounding erythema, increased warmth, or drainage. Mild bruising. LUQ Penrose drain covered with gauze and Tegederm, CDI.    Skin:     General: Skin is warm and dry.   Neurological:      Mental Status: She is alert and oriented to person, place, and time.   Psychiatric:         Mood and Affect: Mood normal.         Behavior: Behavior normal.         Data  CBC        11/05/23  0634 11/04/23  1351 11/03/23  0718   WBC 6.3 8.8 9.6   HGB 12.0 12.5 13.5   HCT 37.8 39.1 42.6   PLT 109* 119* 170       Radiology Results   XR Chest 1 View (AP Portable)    Result Date: 11/05/2023  FINDINGS/IMPRESSION: Improved lung volumes however with persistent perihilar and bibasilar airspace opacities which may reflect multifocal infection, aspiration, and/or edema. No pleural effusion or pneumothorax. Unchanged cardiac and mediastinal contours.  Report dictated by: Viviann Spare, MD, signed by: Viviann Spare, MD Department of Radiology and Biomedical Imaging     Nutritional Assessment  Body mass index is 57.07 kg/m. This represents severe obesity per NIH guidelines.    During this hospitalization the patient is also being treated for:   - Diabetes type 2, SSI changed to resistant    - Pulmonary edema: acute, CXR, dec IVF, LEP/ICS, supplemental oxygen, CPO, diuresis  - Hypoxic respiratory insufficiency, likely chronic, likely in part due to obesity hypoventilation syndrome, requiring supplemental oxygen, ICD 10 G47.35       Code Status: FULL    This note represents the assessment and plan performed and formulated by the rounding surgical team.      Bard Herbert, NP  General Surgery  11/05/23      For new general surgery consults at [address], please Voalte "Consult Acute Care Surgery 1st Call [facility]"  For established Acute Care Surgery patients, please Voalte "Acute Care Surgery 1st Call [facility]"  For established [facility] patients, please Voalte "Surgery [facility] 1st Call"  For established [facility] (MIS/bariatric surgery) patients, please Voalte "Surgery [facility] 1st Call" or "Surgery [facility] APP"  For established [facility] (colorectal and surgical oncology) patients, please Voalte "Surgery [facility] 1st Call"    APP Visit Information:   APP Service Type:  Shared    I spent a total non-overlapping time of 25 minutes in this episode of care preparing to see the patient, obtaining and/or reviewing separately obtained history, performing a medically appropriate examination and/or evaluation, counseling and educating patient/family/caregiver, ordering and reviewing medications, tests, or procedures, referring and communicating with other health care professionals, documenting clinical information in the electronic or other health record, and communicating results to the patient/family/caregiver, and/or other care coordination.

## 2023-11-05 NOTE — Interdisciplinary (Signed)
Spiritual Care Services    Patient's Meaning-Making/Spirituality/Religion: - Ephriam Knuckles -     Reason for Visit: Initial Visit    ______________________________________________________________________    Summary of Visit    I met this morning with Susan Goodwin and her husband [name]. Susan Kamdar was sitting up in a chair at bedside. She reported feeling "better" and looks forward to being discharged soon, perhaps tomorrow. She identified her husband as her main source of support through this time. She said she has no needs from Spiritual Care at this time. She appreciated the visit.    ______________________________________________________________________    Plan of Care    Chaplain will remain available for visits as requested for patient's continuing spiritual care. Chaplain will provide ongoing spiritual and emotional support.    ______________________________________________________________________    Time Spent with Patient: 10 min    Spiritual Care Provider:   Rev. Hedwig Morton, MTS  Chaplain Resident, [place]    ______________________________________________________________________  Spiritual care is available 24/7. To reach the chaplain on call at any time:  [facility] Chaplain Berniece Pap or call [phone]  [facility] On-Call [place] or call [phone]  [facility] [place] or call [phone]

## 2023-11-05 NOTE — Interdisciplinary (Addendum)
DISCHARGE PLAN HANDOFF NOTE      - Anticipated day and date of discharge: Saturday and Sunday  - Time-sensitive tasks (with brief description): No   - D/C plan: Home and DME Oxygen   - Transportation (type, level of care, PCS form status):private  -    - F2F needed? no completed? n/a  - Orders complete?  No- final dc orders pending  - Teach done (if needed)? No, n/a  - DC address if different from facesheet: same  - Family contacts:  pt/husband  - Is the Patient and Family aware and agreeable with plan? Yes  - Weekend Service and Pager: [facility] 1st Call (Pager [phone]   - Agency / Facility / DME (contact and phone numbers):   [facility] / [place] Phone: [phone]    [facility] [place] / [place], [place] Phone: [phone]  - Referral Method -Allscripts  - Primary Case Manager/Covering Case Manager: Mirna Mires  - What needs to be done:    Confirm EDD and needs with team  O2: Approved and delivered at bs per [facility]- See AS reply  HH: only 1 accepting HH on AS.  Confirm dc and SOC with HH  Send final dc orders and summary to [facility]      Update: 2:29 pm per covering team NP pt might dc home today. Orders uploaded to AS . HH and DME provider notified. 3:13pm Per NP EDD: weekend. HH Notified   3  3:13    Heidi Dach RN   Case Mgr for [employer] [facility] ( [facility], [place]), Plastics, OMFS, and 11 ICU primary teams.

## 2023-11-06 LAB — COMPLETE BLOOD COUNT
Hematocrit: 39.5 % (ref 36.0–46.0)
Hemoglobin: 12.8 g/dL (ref 12.0–15.5)
MCH: 31.4 pg (ref 26.0–34.0)
MCHC: 32.4 g/dL (ref 31.0–36.0)
MCV: 97 fL (ref 80–100)
MPV: 10.5 fL (ref 9.1–12.6)
Platelet Count: 125 10*9/L — ABNORMAL LOW (ref 140–450)
RBC Count: 4.07 10*12/L (ref 4.00–5.20)
RDW-CV: 12.3 % (ref 11.7–14.4)
WBC Count: 6 10*9/L (ref 3.4–10.0)

## 2023-11-06 LAB — POCT GLUCOSE
Glucose, Glucometer: 150 mg/dL (ref 70–199)
Glucose, Glucometer: 184 mg/dL (ref 70–199)
Glucose, Glucometer: 153 mg/dL (ref 70–199)
Glucose, Glucometer: 154 mg/dL (ref 70–199)

## 2023-11-06 LAB — BASIC METABOLIC PANEL (NA, K, CL, CO2, BUN, CR, GLU, CA)
Anion Gap: 11 (ref 4–14)
Calcium, total, Serum / Plasma: 8.9 mg/dL (ref 8.4–10.5)
Carbon Dioxide, Total: 30 mmol/L — ABNORMAL HIGH (ref 22–29)
Chloride, Serum / Plasma: 103 mmol/L (ref 101–110)
Creatinine: 0.76 mg/dL (ref 0.55–1.02)
Glucose, non-fasting: 156 mg/dL (ref 70–199)
Potassium, Serum / Plasma: 3.4 mmol/L — ABNORMAL LOW (ref 3.5–5.0)
Sodium, Serum / Plasma: 144 mmol/L (ref 135–145)
Urea Nitrogen, serum/plasma: 9 mg/dL (ref 7–25)
eGFRcr: 89 mL/min/{1.73_m2} (ref >59)

## 2023-11-06 LAB — PHOSPHORUS, SERUM / PLASMA: Phosphorus, serum/plasma: 3.4 mg/dL (ref 2.3–4.7)

## 2023-11-06 LAB — MAGNESIUM, SERUM / PLASMA: Magnesium, Serum / Plasma: 2 mg/dL (ref 1.6–2.6)

## 2023-11-06 MED ORDER — POTASSIUM CHLORIDE 20 MEQ ORAL PACKET
20 mEq | Freq: Once | ORAL | Status: DC
Start: 2023-11-06 — End: 2023-11-10

## 2023-11-06 MED ORDER — HEPARIN INFUSION BOLUS INJECTION 1000 UNIT/ML (ADULT)
1000 | Freq: Once | INTRAMUSCULAR | Status: DC
Start: 2023-11-06 — End: 2023-11-06

## 2023-11-06 MED ORDER — HEPARIN INFUSION BOLUS INJECTION 1000 UNIT/ML (ADULT)
1000 unit/mL | Freq: Four times a day (QID) | INTRAMUSCULAR | Status: DC | PRN
Start: 2023-11-06 — End: 2023-11-08

## 2023-11-06 MED ORDER — IOHEXOL 350 MG IODINE/ML INTRAVENOUS SOLUTION
350 mg iodine/mL | Freq: Once | INTRAVENOUS | Status: AC
Start: 2023-11-06 — End: 2023-11-06
  Administered 2023-11-07: 06:00:00 80 mL via INTRAVENOUS

## 2023-11-06 MED ORDER — HEPARIN (PORCINE) 25,000 UNIT/250 ML IN 0.45 % SODIUM CHLORIDE IV SOLN
25000 units/250 mL (100 units/mL) | INTRAVENOUS | Status: DC
Start: 2023-11-06 — End: 2023-11-08
  Administered 2023-11-07 (×2): 13 [IU]/kg/h via INTRAVENOUS
  Administered 2023-11-07: 17:00:00 15 [IU]/kg/h via INTRAVENOUS
  Administered 2023-11-07: 08:00:00 18 [IU]/kg/h via INTRAVENOUS

## 2023-11-06 MED FILL — SENNA LAX 8.6 MG TABLET: 8.6 8.6 mg | ORAL | Qty: 2

## 2023-11-06 MED FILL — IPRATROPIUM 0.5 MG-ALBUTEROL 3 MG (2.5 MG BASE)/3 ML NEBULIZATION SOLN: 0.5 mg-3 mg(2.5 mg base)/3 mL | RESPIRATORY_TRACT | Qty: 3

## 2023-11-06 MED FILL — ACETAMINOPHEN 650 MG/20.3 ML ORAL SOLUTION: 650 650 mg/20.3 mL | ORAL | Qty: 40.6

## 2023-11-06 MED FILL — HEPARIN, PORCINE (PF) 5,000 UNIT/0.5 ML INJECTION SOLUTION: 5000 5,000 unit/0.5 mL | INTRAMUSCULAR | Qty: 0.5

## 2023-11-06 MED FILL — LIDOCAINE 4 % TOPICAL PATCH: 4 % | TOPICAL | Qty: 1

## 2023-11-06 MED FILL — POTASSIUM CHLORIDE 20 MEQ ORAL PACKET: 20 mEq | ORAL | Qty: 2

## 2023-11-06 MED FILL — GABAPENTIN 300 MG/6 ML (6 ML) ORAL SOLUTION: 300 300 mg/6 mL (6 mL) | ORAL | Qty: 12

## 2023-11-06 MED FILL — LANSOPRAZOLE 30 MG DELAYED RELEASE,DISINTEGRATING TABLET: 30 mg | ORAL | Qty: 1

## 2023-11-06 MED FILL — FUROSEMIDE 10 MG/ML INJECTION SOLUTION: 10 mg/mL | INTRAMUSCULAR | Qty: 2

## 2023-11-06 MED FILL — METOPROLOL TARTRATE 25 MG TABLET: 25 25 mg | ORAL | Qty: 1

## 2023-11-06 MED FILL — ATORVASTATIN 20 MG TABLET: 20 20 mg | ORAL | Qty: 1

## 2023-11-06 MED FILL — METOPROLOL TARTRATE 25 MG TABLET: 25 mg | ORAL | Qty: 1

## 2023-11-06 MED FILL — ACETAMINOPHEN 650 MG/20.3 ML ORAL SOLUTION: 650 mg/20.3 mL | ORAL | Qty: 40.6

## 2023-11-06 MED FILL — PHOS-NAK 280 MG-160 MG-250 MG ORAL POWDER PACKET: 280-160-250 280-160-250 mg | ORAL | Qty: 2

## 2023-11-06 NOTE — Progress Notes (Addendum)
2242- I received notification from Radiology that pt CT PE showed:  Multiple segmental and subsegmental scattered pulmonary emboli most proximately in a right lower lobe segmental branch. No evidence of right heart strain.Dilated pulmonary artery measuring up to 3.8 cm, can be seen in the setting of pulmonary hypertension. Finding favored not to be secondary to burden of pulmonary embolism.     2251- Made Surgical Chief Aware. New Order: Start heparin gtt for PE with no bolus. I spoke to the clinical pharmacist at main pharmacy due to the patient's weight. Recommend I use 100kg dosing for pt while starting heparin gtt.     SignedMurray Hodgkins, NP  General Surgery  11/16/23

## 2023-11-07 LAB — BASIC METABOLIC PANEL (NA, K, CL, CO2, BUN, CR, GLU, CA)
Anion Gap: 11 (ref 4–14)
Calcium, total, Serum / Plasma: 9.4 mg/dL (ref 8.4–10.5)
Carbon Dioxide, Total: 27 mmol/L (ref 22–29)
Chloride, Serum / Plasma: 104 mmol/L (ref 101–110)
Creatinine: 0.65 mg/dL (ref 0.55–1.02)
Glucose, non-fasting: 159 mg/dL (ref 70–199)
Potassium, Serum / Plasma: 3.9 mmol/L (ref 3.5–5.0)
Sodium, Serum / Plasma: 142 mmol/L (ref 135–145)
Urea Nitrogen, serum/plasma: 13 mg/dL (ref 7–25)
eGFRcr: 99 mL/min/{1.73_m2} (ref >59)

## 2023-11-07 LAB — COMPLETE BLOOD COUNT
Hematocrit: 39.5 % (ref 36.0–46.0)
Hematocrit: 38.1 % (ref 36.0–46.0)
Hemoglobin: 12.7 g/dL (ref 12.0–15.5)
Hemoglobin: 12.7 g/dL (ref 12.0–15.5)
MCH: 30.8 pg (ref 26.0–34.0)
MCH: 31.6 pg (ref 26.0–34.0)
MCHC: 32.2 g/dL (ref 31.0–36.0)
MCHC: 33.3 g/dL (ref 31.0–36.0)
MCV: 96 fL (ref 80–100)
MCV: 95 fL (ref 80–100)
MPV: 10.2 fL (ref 9.1–12.6)
MPV: 10.3 fL (ref 9.1–12.6)
Platelet Count: 122 10*9/L — ABNORMAL LOW (ref 140–450)
Platelet Count: 127 10*9/L — ABNORMAL LOW (ref 140–450)
RBC Count: 4.12 10*12/L (ref 4.00–5.20)
RBC Count: 4.02 10*12/L (ref 4.00–5.20)
RDW-CV: 12.1 % (ref 11.7–14.4)
RDW-CV: 12.3 % (ref 11.7–14.4)
WBC Count: 5.8 10*9/L (ref 3.4–10.0)
WBC Count: 5.7 10*9/L (ref 3.4–10.0)

## 2023-11-07 LAB — PROTHROMBIN TIME
INR: 1.1 (ref 0.9–1.2)
PT: 13.8 s (ref 11.6–14.8)

## 2023-11-07 LAB — ACTIVATED PARTIAL THROMBOPLASTIN TIME
aPTT: 27.9 s (ref 21.6–30.8)
aPTT: >100 s — ABNORMAL HIGH (ref 21.6–30.8)
aPTT: 84.5 s — ABNORMAL HIGH (ref 21.6–30.8)

## 2023-11-07 LAB — POCT GLUCOSE
Glucose, Glucometer: 203 mg/dL — ABNORMAL HIGH (ref 70–199)
Glucose, Glucometer: 163 mg/dL (ref 70–199)
Glucose, Glucometer: 165 mg/dL (ref 70–199)
Glucose, Glucometer: 172 mg/dL (ref 70–199)

## 2023-11-07 LAB — TROPONIN I: Troponin I: <0.02 ug/L (ref 0.00–0.04)

## 2023-11-07 LAB — B-TYPE NATRIURETIC PEPTIDE: BNP: 12 pg/mL (ref <82)

## 2023-11-07 MED ORDER — OXYCODONE 5 MG/5 ML ORAL SOLUTION
5 mg/ mL | Freq: Four times a day (QID) | ORAL | Status: DC | PRN
Start: 2023-11-07 — End: 2023-11-10
  Administered 2023-11-08 – 2023-11-10 (×3): 2 mg via ORAL

## 2023-11-07 MED ORDER — IPRATROPIUM 0.5 MG-ALBUTEROL 3 MG (2.5 MG BASE)/3 ML NEBULIZATION SOLN
0.5 mg-3 mg(2.5 mg base)/3 mL | Freq: Two times a day (BID) | RESPIRATORY_TRACT | Status: DC
Start: 2023-11-07 — End: 2023-11-10
  Administered 2023-11-08 – 2023-11-10 (×6): 3 mL via RESPIRATORY_TRACT

## 2023-11-07 MED ORDER — FUROSEMIDE 10 MG/ML INJECTION SOLUTION
10 mg/mL | Freq: Once | INTRAMUSCULAR | Status: AC
Start: 2023-11-07 — End: 2023-11-07
  Administered 2023-11-07: 22:00:00 20 mg via INTRAVENOUS

## 2023-11-07 MED FILL — SENNA LAX 8.6 MG TABLET: 8.6 8.6 mg | ORAL | Qty: 2

## 2023-11-07 MED FILL — GABAPENTIN 300 MG/6 ML (6 ML) ORAL SOLUTION: 300 300 mg/6 mL (6 mL) | ORAL | Qty: 12

## 2023-11-07 MED FILL — HEPARIN (PORCINE) 25,000 UNIT/250 ML IN 0.45 % SODIUM CHLORIDE IV SOLN: 25000 25,000 units/250 mL (100 units/mL) | INTRAVENOUS | Qty: 250

## 2023-11-07 MED FILL — LIDOCAINE 4 % TOPICAL PATCH: 4 % | TOPICAL | Qty: 2

## 2023-11-07 MED FILL — ONDANSETRON HCL (PF) 4 MG/2 ML INJECTION SOLUTION: 4 4 mg/2 mL | INTRAMUSCULAR | Qty: 2

## 2023-11-07 MED FILL — METOPROLOL TARTRATE 25 MG TABLET: 25 25 mg | ORAL | Qty: 1

## 2023-11-07 MED FILL — LANSOPRAZOLE 30 MG DELAYED RELEASE,DISINTEGRATING TABLET: 30 30 mg | ORAL | Qty: 1

## 2023-11-07 MED FILL — HEALTHYLAX 17 GRAM ORAL POWDER PACKET: 17 17 gram | ORAL | Qty: 1

## 2023-11-07 MED FILL — IPRATROPIUM 0.5 MG-ALBUTEROL 3 MG (2.5 MG BASE)/3 ML NEBULIZATION SOLN: 0.5 mg-3 mg(2.5 mg base)/3 mL | RESPIRATORY_TRACT | Qty: 3

## 2023-11-07 MED FILL — HEPARIN, PORCINE (PF) 5,000 UNIT/0.5 ML INJECTION SOLUTION: 5000 5,000 unit/0.5 mL | INTRAMUSCULAR | Qty: 0.5

## 2023-11-07 MED FILL — FUROSEMIDE 10 MG/ML INJECTION SOLUTION: 10 mg/mL | INTRAMUSCULAR | Qty: 2

## 2023-11-07 NOTE — Progress Notes (Signed)
SURGERY PROGRESS NOTE     Problem-based Assessment & Plan  Susan Goodwin is a 62 y.o. 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 Aundria Rud. Hospital course notable for acute pulmonary edema requiring diuresis and hypoxic respiratory insufficiency requiring supplemental oxygen.     #S/p laparoscopic Roux-En-Y gastric bypass  - Stage II bariatric diet  - All tablets crushed. Capsules okay.  - Continue PO APAP, gabapentin, lidocaine patch, dc'd baclofen d/t blurry vision, and crushed oxycodone. Avoid IV narcotics.  - Scheduled Zofran   - FSBS and RISS  - Lansoprazole  - Encourage ambulation  - SCDs  - SQH  - Penrose drain to be removed on the day of discharge  - PT eval  - Abdominal binder     #OSA (not on CPAP)  #Hypoxic respiratory insufficiency, likely chronic, likely in part due to obesity hypoventilation syndrome, requiring supplemental oxygen, ICD 10 G47.35   #Acute pulmonary edema  #Subsegmental pulmonary emboli  - CPO  - Supplemental oxygen to keep O2 sat >92%  - CXR 12/11 w/ pulmonary edema and low lung volumes  -CTPE w/Several scattered subsegmental pulmonary emboli 12/14. Heparin gtt per protocol. Will need anticoagulation plan for discharge   -Pulmonary consult appreciate recs  - Strict I&O  - Encourage incentive spirometry  - Lung expansion pathway  - Ambulatory O2 studies for anticipated discharge home with O2  SpO2 on room air at rest: 81%  SpO2 96% on 6LNC  SpO2 with ambulation on room air: 79%  SpO2 while ambulating on room air on 6 LPM of oxygen improves to: 90%     #HTN  - Hold home furosemide and losartan  - Continue metoprolol     #T2DM  - Hold home metformin, Humulin, pioglitazeone  - ISS Resistant for glucose >180        #HLD  - Continue atorvastatin    24 Hour Course  CTPE with PE started on heparin drip    Subjective  Feeling ok. No changes in breathing. Asking about  blood thinners.     Vitals  Temp:  [36.7 C (98.1 F)-37.1 C (98.8 F)] 36.9 C (98.4 F)  Heart Rate:  [74-96] 74  *Resp:  [16-20] 18  BP: (112-138)/(50-61) 125/53  SpO2:  [88 %-100 %] 96 %    Input / Output  I/O last 2 completed shifts plus current shift:  In: 1386.6 [P.O.:1320; I.V.:66.6]  Out: 950 [Urine:950]    Physical Exam  Physical Exam  Constitutional:       General: She is not in acute distress.     Appearance: She is obese. She is not ill-appearing.   Cardiovascular:      Rate and Rhythm: Normal rate.   Pulmonary:      Effort: Pulmonary effort is normal. No respiratory distress.      Comments: 3L NC 96%  Abdominal:      General: There is no distension.      Palpations: Abdomen is soft.      Tenderness: There is abdominal tenderness (Appropriate post-op tenderness).      Comments: Laparoscopic incisions glued and open to air. No surrounding erythema, increased warmth, or drainage. No surrounding erythema, increased warmth, or drainage. Mild bruising. LUQ Penrose drain covered with gauze and Tegederm, CDI.    Skin:     General: Skin is warm and dry.   Neurological:  Mental Status: She is alert and oriented to person, place, and time.   Psychiatric:         Mood and Affect: Mood normal.         Behavior: Behavior normal.     Data  CBC        11/07/23  0627 11/07/23  0019 11/06/23  0719   WBC 5.7 5.8 6.0   HGB 12.7 12.7 12.8   HCT 38.1 39.5 39.5   PLT 127* 122* 125*       Radiology Results   US Doppler Lower Extremity Venous, Bilateral    Result Date: 11/07/2023  Limited by nonvisualization of the right peroneal vein due to poor acoustic attenuation.  Within this limit, negative examination for deep venous thrombosis in the bilateral lower extremities from the common femoral vein through the ankle. Report dictated by: Philis Fendt, MD, signed by: Linde Gillis, MD Department of Radiology and Biomedical Imaging     CTA Chest Pulmonary Embolism (CTPE)    Result Date: 11/07/2023  Several  scattered subsegmental pulmonary emboli as described above. Enlarged pulmonary artery disproportionate to the size and number of pulmonary emboli. //Impression 1 secure messaged to  Murray Hodgkins, NP (General Surgery) by Dr. Esmeralda Links, MD (Radiology) on 11/06/2023 10:35 PM.// Report dictated by: Esmeralda Links, MD, signed by: Dorena Bodo, MD Department of Radiology and Biomedical Imaging     Nutritional Assessment  Body mass index is 57.07 kg/m. This represents severe obesity per NIH guidelines.    During this hospitalization the patient is also being treated for:   -acute pulmonary emboli  - Diabetes type 2, SSI changed to resistant    - Pulmonary edema: acute, CXR, dec IVF, LEP/ICS, supplemental oxygen, CPO, diuresis  - Hypoxic respiratory insufficiency, likely chronic, likely in part due to obesity hypoventilation syndrome, requiring supplemental oxygen, ICD 10 G47.35     Code Status: FULL      Grandville Silos, NP  [facility] Service   11/07/2023

## 2023-11-07 NOTE — Plan of Care (Signed)
Problem: Discharge Planning - Adult  Goal: Knowledge of and participation in plan of care  Outcome: Progress within 12 hours     Problem: Activity Intolerance - Abdominal / GI / GU Surgery Patient - Adult  Goal: Improved activity tolerance ( return to admit level or baseline )  Outcome: Progress within 12 hours  Goal: Able to perform physical activity as ordered  Outcome: Progress within 12 hours     Problem: Airway, Ineffective - Abdominal / GI / GU Surgery Patient - Adult  Goal: Patent airway / effective airway clearance  Outcome: Progress within 12 hours     Problem: Nutrition, Alteration in - Abdominal / GI / GU Surgery Patient - Adult  Goal: Adequate nutritional intake  Outcome: Progress within 12 hours  Goal: Maximize nutritional intake per patient condition  Outcome: Progress within 12 hours     Problem: Fall, at Risk or Actual - Adult  Goal: Absence of falls and fall related injury  Outcome: Progress within 12 hours  Goal: Knowledge of fall prevention  Outcome: Progress within 12 hours

## 2023-11-07 NOTE — Consults (Addendum)
 PULMONARY FOLLOW-UP CONSULT NOTE     Assessment and Recommendations  Susan Goodwin is a 61 y.o. woman with asthma, severe OSA (not regularly on CPAP, but has machine), HTN, GERD, and T2DM, obesity (BMI 57), who is now POD4 s/p roux-en-Y gastric bypass, with a persistent oxygen requirement. Pulmonary is consulted regarding new hypoxemia.    Hypoxemic respiratory failure in the post-operative setting had been thought multifactorial: atelectasis, splinting, mild pulmonary obesity, suspected OHS, and increases to V/Q mismatch from obesity. Overnight, Susan Goodwin completed a CTPE which showed multiple subsegmental PEs without R heart strain (neg BNP, neg trop, no CT evidence) which are certainly contributing to hypoxemia as well. Given these pulmonary emboli are LOW risk by hemodynamic effects and impact on the right heart, agree with [facility] via hep vs LMWH as preferred by General Surgery. Recommend consult to hematology for guidance around [facility] upon discharge. Note otherwise that she had enlargement on her     Recommendations  - Please obtain TTE this admission; consider upper extremity DVT US given (limited) negative BLE  - Agree with AC; please consult hematology to discuss [facility] plan for discharge  - Agree with use of CPAP for OSA/suspected OHS; may need bilevel in the long term but should follow up with outpatient providers to discuss  - Will need outpatient spirometry and DLCO once acute illness resolved  - Encourage mobility: out of bed to chair for all meals, walk as frequently as possible  - Incentive spirometer at bedside; we encouraged her to use hourly  - Duonebs PRN  - May need to discharge with supplemental O2, please maintain sats >94%    Susan Randy, MD  Pulmonary & Critical Care Fellow  11/07/2023    Thank you for this consult, we will sign off. Recommendations have been communicated to the primary team. Patient seen and discussed with Lendon Colonel, MD of the general pulmonary consult service.    Please feel  free to contact the Pulmonary consult service through [facility] (Consult Pulmonary 1st Call Parn) with any questions or concerns.    Interval Events/Subjective  - CT overnight + scattered subsegmental pulmonary emboli, enlarged PA disproportionate to PE burden    Feeling very grateful today, is excited to begin weight loss journey and teary about limited mobility from knees currently.    Scheduled Meds:   0.9% sodium chloride flush  3 mL Intravenous Q12H SCH    acetaminophen  1,000 mg Oral Q6H    fluticasone propionate  1 spray Each Nostril Daily SCH    gabapentin  600 mg Oral Daily At Bedtime [facility]    insulin aspart U-100  0-60 Units Subcutaneous TID AC SCH    insulin aspart U-100  0-9 Units Subcutaneous Daily At Bedtime [facility]    ipratropium-albuterol  3 mL Nebulization BID    lansoprazole  30 mg Oral Q AM Before Breakfast SCH    lidocaine  2 patch Topical Daily SCH    metoprolol tartrate  12.5 mg Oral BID SCH    ondansetron  4 mg Intravenous Q6H    potassium chloride  40 mEq Oral Once    senna  17.2 mg Oral Daily At Bedtime [facility]     Continuous Infusions:   heparin 13 Units/kg/hr (11/07/23 1556)     PRN Meds:   sodium chloride  1 spray Each Nostril Q6H PRN    0.9% sodium chloride flush  3 mL Intravenous PRN    albuterol  2 puff Inhalation Q6H PRN  bisacodyL  10 mg Rectal Daily PRN    dextrose 50%  12.5 g Intravenous Q15 Min PRN    Or    glucose  16 g Oral Q15 Min PRN    Or    juice (for hypoglycemia)  16 g Oral Q15 Min PRN    heparin  70 Units/kg (Order-Specific) Intravenous Q6H PRN    insulin aspart U-100  0-9 Units Subcutaneous Early 2 AM PRN    oxyCODONE  2.5-5 mg Oral Q6H PRN    polyethylene glycol  17 g Oral Daily PRN    prochlorperazine  5-10 mg Intravenous Q6H PRN     Data  Vital Sign Min/Max (last 24 hours)       Value Min Max    Temp 36.5 C (97.7 F) 37.1 C (98.8 F)    Heart Rate 73 85    *Resp 16 20    BP: Systolic 112 153    BP: Diastolic 50 84    MAP (mmHg) 68 mmHg 97 mmHg    SpO2 88 % 99 %           Weight: (!) 141.5 kg (312 lb)     O2 Device: Nasal cannula  O2 Flow Rate (L/min): 3 L/min     I/O         12/12 0701  12/13 0700 12/13 0701  12/14 0700 12/14 0701  12/15 0700 12/15 0701  12/16 0700    P.O. 490 1050 1320 445    I.V. (mL/kg)   66.6 (0.47)     Total Intake(mL/kg) 490 (3.46) 1050 (7.42) 1386.6 (9.8) 445 (3.14)    Urine (mL/kg/hr) 1050 (0.31) 2600 (0.77) 950 (0.28) 1600 (0.77)    Stool  150 0     Total Output(mL/kg) 1050 (7.42) 2750 (19.43) 950 (6.71) 1600 (11.31)    Net -560 -1700 +436.6 -1155            Unmeasured Urine Occurrence 0 x 2 x 1 x     Unmeasured Stool Occurrence 0 x 3 x 1 x           Physical Exam  Gen: Obese woman, well-appearing, sitting upright at edge of bed  HEENT: NCAT  CV: RRR  Pulm: Crackles in bases, improve with recurrent respirations  Abd: Soft, non-distended  Ext: Warm, trace edema  Neuro: Oriented xs 3    Labs   142 104 13 / 159    3.9 27 0.65 \     12/15 1252     5.7 \ 12.7 / 127*    / 38.1 \    12/15 0627       Recent Lab Values         11/07/2023 11/07/2023 11/07/2023 11/06/2023 11/05/2023 11/04/2023 11/03/2023 11/02/2023     12:52 PM  6:27 AM 12:19 AM  7:19 AM  6:34 AM  1:51 PM  7:18 AM  5:45 PM    WBC -- 5.7 5.8 6.0 6.3 8.8 9.6 13.9    HGB -- 12.7 12.7 12.8 12.0 12.5 13.5 13.8    HCT -- 38.1 39.5 39.5 37.8 39.1 42.6 42.6    PLT -- 127 122 125 109 119 170 204    INR -- -- 1.1 -- -- 1.2 -- --    NA 142 -- -- 144 143 139 141 --    K 3.9 -- -- 3.4 3.6 3.6 3.7 --    CO2 27 -- -- 30 28 27 28  --  CA 9.4 -- -- 8.9 8.4 8.3 8.6 --    BUN 13 -- -- 9 6 9 6  --    CREAT 0.65 -- -- 0.76 0.67 0.68 0.72 --    GLU 159 -- -- 156 179 220 149 --    MG -- -- -- 2.0 2.0 1.9 2.0 --    PO4 -- -- -- 3.4 2.8 1.8 3.5 --    CL 104 -- -- 103 106 105 107 --    PT -- -- 13.8 -- -- 14.5 -- --          No results found for: "NEUTA", "EOA"  No results found for: "PH37"  No results found for: "PCO2" No results found for: "PO2" No results found for: "FIO2"     Lab Results   Component Value Date    AST  23 09/22/2023    Alanine transaminase 25 09/22/2023    Alkaline Phosphatase 48 09/22/2023    Bilirubin, Total 0.3 09/22/2023     Lab Results   Component Value Date    PT 13.8 11/07/2023    aPTT 84.5 (HH) 11/07/2023    INR 1.1 11/07/2023    INR 1.2 11/04/2023    INR 1.0 09/22/2023     BNP   Date Value Ref Range Status   11/07/2023 12 <82 pg/mL Final     Thyroid Stimulating Hormone   Date Value Ref Range Status   09/22/2023 2.61 0.45 - 4.12 mIU/L Final     No components found for: "LDH"  No results found for: "GUA", "BIUA", "KEUA", "SGUA", "HBUA", "PHUA", "PRUA", "NIUA", "WEUA", "UROB", "SEDUA"  No results found for: "ANA", "ANAP", "ANAT", "ANAEXL", "ANATSIEXQ", "ANAPSEXQ", "CYCP", "CCPABEXL", "CCPABIGGEXQ", "RF", "RFT", "RFEXL", "SM", "SMRNPEXQ", "RNP", "RNPEXL", "RNPABEXQ", "RNPIGG", "RNPQ", "DSDNA", "DSDNAEXQ", "SSA", "SSAEXQ", "SSB", "SSBEXQ", "AENA", "AENAEXQ", "AP3A", "AP3AEXQ", "AMYE", "AMYEEXQ", "SCL70I", "SCL70", "SCL7", "AGBM"  No results found for: "MYS1", "MYS1A", "MYS2", "JO1", "JO1AB", "MYS3", "MYS4", "MYS5", "MYS6", "MYS7", "MYS8", "MYS9", "MYS10", "MYS11", "MYS12", "MYS13", "MYS14", "MYS15", "MYS16", "MYS17", "MYS18", "MYS19"  Lab Results   Component Value Date    Ferritin, Serum/Plasma 18 09/22/2023     No results found for: "IGM", "IGA", "IGE"    Pleural Fluid Studies  No results found for: "BFT", "APB", "COLB", "VISB", "WCB", "RCB", "HBF", "LB", "MHMB", "OCB", "CSB", "BADI", "GLBF", "TPBF", "PHB", "LDB", "ALBB", "TGBF"  No results found for: "BFT", "APB", "COLB", "VISB", "WCB", "RCB", "HBF", "LB", "MHMB", "OCB", "CSB", "BADI", "GLBF", "TPBF", "PHB", "LDB", "ALBB", "TGBF"      Microbiology  Microbiology Results (last 72 hours)       ** No results found for the last 72 hours. **           Imaging   US Doppler Lower Extremity Venous, Bilateral    Result Date: 11/07/2023  Limited by nonvisualization of the right peroneal vein due to poor acoustic attenuation.  Within this limit, negative examination for  deep venous thrombosis in the bilateral lower extremities from the common femoral vein through the ankle. Report dictated by: Philis Fendt, MD, signed by: Linde Gillis, MD Department of Radiology and Biomedical Imaging     CTA Chest Pulmonary Embolism (CTPE)    Result Date: 11/07/2023  Several scattered subsegmental pulmonary emboli as described above. Enlarged pulmonary artery disproportionate to the size and number of pulmonary emboli. //Impression 1 secure messaged to  Murray Hodgkins, NP (General Surgery) by Dr. Esmeralda Links, MD (Radiology) on 11/06/2023 10:35 PM.// Report dictated by: Esmeralda Links, MD, signed by:  Dorena Bodo, MD Department of Radiology and Biomedical Imaging   PFTs       No data to display              TTE  No results found for this or any previous visit.  Cardiac Cath  No results found for this or any previous visit.

## 2023-11-07 NOTE — Consults (Signed)
BIPAP/CPAP Safety Assessment    Patient Name: Susan Goodwin  MRN#: [id number]  Room #: 1312/1312-1    Admit Date: 11/02/2023  Operative Date: 11/02/2023    BiPAP/CPAP Device and Settings:  Indication: OSA    Home CPAP       Last filed Vital Signs (within 4 hrs):  Temp: 36.5 C (11/07/23 2019)  Heart Rate: 85 (11/07/23 2110)  *Resp: 20 (11/07/23 2110)  BP: (!) 153/84 (11/07/23 2019)  SpO2: 95 % (11/07/23 2141)    Bedside evaluation:  Neuro Status: A/O x 4  Opiate/Sedative Use: Oxycodone PRN  Monitoring in place: CPO  Pt is capable of removing mask as needed? Yes  Patient is appropriate to remain at current level of care on Ac[facility] care floor.     Device is plugged in to red electrical safety outlet? Yes  Alarms are on and set? Does not apply to this home device    Primary RT and RN to follow patient throughout the night per standard of care.    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 Aundria Rud. Hospital course notable for acute pulmonary edema requiring diuresis and hypoxic respiratory insufficiency requiring supplemental oxygen.     Total Time Spent (Minutes): 30 mins

## 2023-11-08 LAB — POCT GLUCOSE
Glucose, Glucometer: 186 mg/dL (ref 70–199)
Glucose, Glucometer: 155 mg/dL (ref 70–199)
Glucose, Glucometer: 169 mg/dL (ref 70–199)
Glucose, Glucometer: 177 mg/dL (ref 70–199)

## 2023-11-08 LAB — ACTIVATED PARTIAL THROMBOPLASTIN TIME
aPTT: 59.8 s — ABNORMAL HIGH (ref 21.6–30.8)
aPTT: 59.8 s — ABNORMAL HIGH (ref 21.6–30.8)

## 2023-11-08 LAB — COMPLETE BLOOD COUNT
Hematocrit: 39.7 % (ref 36.0–46.0)
Hemoglobin: 12.9 g/dL (ref 12.0–15.5)
MCH: 30.9 pg (ref 26.0–34.0)
MCHC: 32.5 g/dL (ref 31.0–36.0)
MCV: 95 fL (ref 80–100)
MPV: 10.5 fL (ref 9.1–12.6)
Platelet Count: 126 10*9/L — ABNORMAL LOW (ref 140–450)
RBC Count: 4.18 10*12/L (ref 4.00–5.20)
RDW-CV: 12.3 % (ref 11.7–14.4)
WBC Count: 6 10*9/L (ref 3.4–10.0)

## 2023-11-08 MED ORDER — PERFLUTREN LIPID MICROSPHERES 1.1 MG/ML INTRAVENOUS SUSPENSION
1.1 | INTRAVENOUS | Status: AC
Start: 2023-11-08 — End: 2023-11-08

## 2023-11-08 MED ORDER — MAGNESIUM HYDROXIDE 400 MG/5 ML ORAL SUSPENSION
400 | Freq: Once | ORAL | Status: DC
Start: 2023-11-08 — End: 2023-11-08

## 2023-11-08 MED ORDER — APIXABAN 5 MG TABLET
5 | Freq: Two times a day (BID) | ORAL | Status: DC
Start: 2023-11-08 — End: 2023-11-10

## 2023-11-08 MED ORDER — FUROSEMIDE 10 MG/ML INJECTION SOLUTION
10 | Freq: Once | INTRAMUSCULAR | Status: AC
Start: 2023-11-08 — End: 2023-11-08
  Administered 2023-11-08: 18:00:00 20 mg via INTRAVENOUS

## 2023-11-08 MED ORDER — APIXABAN 5 MG TABLET
5 | Freq: Two times a day (BID) | ORAL | Status: DC
Start: 2023-11-08 — End: 2023-11-08

## 2023-11-08 MED ORDER — FUROSEMIDE 10 MG/ML INJECTION SOLUTION
10 | Freq: Once | INTRAMUSCULAR | Status: AC
Start: 2023-11-08 — End: 2023-11-08
  Administered 2023-11-09: 04:00:00 20 mg via INTRAVENOUS

## 2023-11-08 MED ORDER — BISACODYL 10 MG RECTAL SUPPOSITORY
10 | Freq: Once | RECTAL | Status: DC
Start: 2023-11-08 — End: 2023-11-10

## 2023-11-08 MED ORDER — APIXABAN 5 MG TABLET
5 | Freq: Two times a day (BID) | ORAL | Status: DC
Start: 2023-11-08 — End: 2023-11-10
  Administered 2023-11-08 – 2023-11-10 (×5): 10 mg via ORAL

## 2023-11-08 MED ORDER — LACTULOSE 20 GRAM/30 ML ORAL SOLUTION
20 | Freq: Once | ORAL | Status: AC
Start: 2023-11-08 — End: 2023-11-08
  Administered 2023-11-08: 16:00:00 10 g via ORAL

## 2023-11-08 MED ORDER — PERFLUTREN LIPID MICROSPHERES 1.1 MG/ML INTRAVENOUS SUSPENSION
1.1 | Freq: Once | INTRAVENOUS | Status: AC
Start: 2023-11-08 — End: 2023-11-08

## 2023-11-08 MED ADMIN — perflutren lipid microspheres (DEFINITY) 1.1 mg/mL injection: 0 | INTRAVENOUS | @ 22:00:00 | NDC 11994001116

## 2023-11-08 MED FILL — SENNA LAX 8.6 MG TABLET: 8.6 8.6 mg | ORAL | Qty: 2

## 2023-11-08 MED FILL — OXYCODONE 5 MG/5 ML ORAL SOLUTION: 5 5 mg/5 mL | ORAL | Qty: 5

## 2023-11-08 MED FILL — ACETAMINOPHEN 650 MG/20.3 ML ORAL SOLUTION: 650 650 mg/20.3 mL | ORAL | Qty: 40.6

## 2023-11-08 MED FILL — LANSOPRAZOLE 30 MG DELAYED RELEASE,DISINTEGRATING TABLET: 30 30 mg | ORAL | Qty: 1

## 2023-11-08 MED FILL — ELIQUIS 5 MG TABLET: 5 mg | ORAL | Qty: 2

## 2023-11-08 MED FILL — DEFINITY 1.1 MG/ML INTRAVENOUS SUSPENSION: 1.1 1.1 mg/mL | INTRAVENOUS | Qty: 2

## 2023-11-08 MED FILL — FUROSEMIDE 10 MG/ML INJECTION SOLUTION: 10 mg/mL | INTRAMUSCULAR | Qty: 2

## 2023-11-08 MED FILL — ONDANSETRON HCL (PF) 4 MG/2 ML INJECTION SOLUTION: 4 4 mg/2 mL | INTRAMUSCULAR | Qty: 2

## 2023-11-08 MED FILL — METOPROLOL TARTRATE 25 MG TABLET: 25 25 mg | ORAL | Qty: 1

## 2023-11-08 MED FILL — IPRATROPIUM 0.5 MG-ALBUTEROL 3 MG (2.5 MG BASE)/3 ML NEBULIZATION SOLN: 0.5 0.5 mg-3 mg(2.5 mg base)/3 mL | RESPIRATORY_TRACT | Qty: 3

## 2023-11-08 MED FILL — LIDOCAINE 4 % TOPICAL PATCH: 4 % | TOPICAL | Qty: 2

## 2023-11-08 MED FILL — GABAPENTIN 300 MG/6 ML (6 ML) ORAL SOLUTION: 300 300 mg/6 mL (6 mL) | ORAL | Qty: 12

## 2023-11-08 MED FILL — LACTULOSE 20 GRAM/30 ML ORAL SOLUTION: 20 20 gram/30 mL | ORAL | Qty: 30

## 2023-11-08 NOTE — Progress Notes (Signed)
SURGERY PROGRESS NOTE     Problem-based Assessment & Plan  Susan Goodwin is a 62 y.o. 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 Aundria Rud. Hospital course notable for acute pulmonary edema requiring diuresis and hypoxic respiratory insufficiency requiring supplemental oxygen.     #S/p laparoscopic Roux-En-Y gastric bypass  - Stage II bariatric diet  - All tablets crushed. Capsules okay.  - Continue PO APAP, gabapentin, lidocaine patch, and crushed oxycodone. Avoid IV narcotics. Baclofen previously discontinued d/t blurry vision,  - Scheduled Zofran   - FSBS and RISS  - Lansoprazole  - SCDs  - SQH  - Penrose drain to be removed on the day of discharge  - PT eval  - Abdominal binder  - Bowel regimen: Miralax and senna. Lactulose 10 g and bisacodyl suppository today.     #OSA on CPAP (non-compliant with use at home)  #Hypoxic respiratory insufficiency, likely chronic, likely in part due to obesity hypoventilation syndrome, requiring supplemental oxygen, ICD 10 G47.35   #Acute pulmonary edema  #Acute subsegmental pulmonary emboli  - Appreciate Pulmonary recommendations  - CXR 12/11 w/ pulmonary edema and low lung volumes  - CTPE 12/14 w/ several scattered subsegmental pulmonary emboli  - Discontinue heparin gtt and start Eliquis  - Anticoagulation education prior to discharge  - Lung expansion pathway  - Encourage ambulation  - Encourage incentive spirometry  - CPAP for OSA  - Duonebs PRN  - CPO  - Strict I&O  - Plan for outpatient PFTs: Spirometry and DLCO + sleep medicine follow up.  - Supplemental oxygen to keep O2 sat >94%  - TTE today  - Will consider upper extremity DVT US given (limited) negative BLE  - Goal net neg at least 0.5 L daily. Lasix 20 mg IV today.  - Ambulatory O2 studies 12/12 for anticipated discharge home with O2:  SpO2 on room air at rest: 81%  SpO2 96% on 6LNC  SpO2 with ambulation on  room air: 79%  SpO2 while ambulating on room air on 6 LPM of oxygen improves to: 90%  - Plan for repeat ambulatory O2 studies prior to discharge     #HTN  - Hold home furosemide and losartan  - Continue metoprolol     #T2DM  - Hold home metformin, Humulin, pioglitazeone  - ISS Resistant for glucose >180     #HLD  - Continue atorvastatin    24 Hour Course  Continued hep gtt  Lasix 20 mg IV x 1   Hep gtt therapeutic x 2  Home CPAP ordered, overnight desaturation on home CPAP. Placed on 4L NC. O2 95-97%     Subjective  Feels constipated    Vitals  Temp:  [36.5 C (97.7 F)-37.1 C (98.8 F)] 37.1 C (98.8 F)  Heart Rate:  [68-85] 75  *Resp:  [16-20] 19  BP: (107-153)/(51-84) 107/56  SpO2:  [95 %-99 %] 95 %    Input / Output  I/O last 2 completed shifts plus current shift:  In: 1331.93 [P.O.:1010; I.V.:321.93]  Out: 2150 [Urine:2150]    Physical Exam  Physical Exam  Constitutional:       General: She is not in acute distress.     Appearance: She is obese. She is not ill-appearing.   Cardiovascular:      Rate and Rhythm: Normal rate.   Pulmonary:      Effort: Pulmonary effort is normal.  No respiratory distress.      Comments: 4L NC 96%  Abdominal:      General: There is no distension.      Palpations: Abdomen is soft.      Tenderness: There is abdominal tenderness (Appropriate post-op tenderness).      Comments: Laparoscopic incisions glued and open to air. No surrounding erythema, increased warmth, or drainage. No surrounding erythema, increased warmth, or drainage. Mild bruising. LUQ Penrose drain covered with gauze and Tegederm, CDI.    Skin:     General: Skin is warm and dry.   Neurological:      Mental Status: She is alert and oriented to person, place, and time.   Psychiatric:         Mood and Affect: Mood normal.         Behavior: Behavior normal.     Data  CBC        11/08/23  0253 11/07/23  0627 11/07/23  0019   WBC 6.0 5.7 5.8   HGB 12.9 12.7 12.7   HCT 39.7 38.1 39.5   PLT 126* 127* 122*       Radiology  Results   US Doppler Lower Extremity Venous, Bilateral    Result Date: 11/07/2023  Limited by nonvisualization of the right peroneal vein due to poor acoustic attenuation.  Within this limit, negative examination for deep venous thrombosis in the bilateral lower extremities from the common femoral vein through the ankle. Report dictated by: Philis Fendt, MD, signed by: Linde Gillis, MD Department of Radiology and Biomedical Imaging     Nutritional Assessment  Body mass index is 57.07 kg/m. This represents severe obesity per NIH guidelines.    During this hospitalization the patient is also being treated for:   -acute pulmonary emboli  - Diabetes type 2, SSI changed to resistant    - Pulmonary edema: acute, CXR, dec IVF, LEP/ICS, supplemental oxygen, CPO, diuresis  - Hypoxic respiratory insufficiency, likely chronic, likely in part due to obesity hypoventilation syndrome, requiring supplemental oxygen, ICD 10 G47.35     Code Status: FULL      This note represents the assessment and plan performed and formulated by the rounding surgical team.      Lilly Cove, NP  General Surgery  11/08/23      For new general surgery consults at [facility], please Voalte "Consult Acute Care Surgery 1st Call Parn"  For established Acute Care Surgery patients, please Voalte "Acute Care Surgery 1st Call Parn"  For established [facility] patients, please Voalte "Surgery [facility] 1st Call"  For established [facility] (MIS/bariatric surgery) patients, please Voalte "Surgery [facility] 1st Call" or "Surgery [facility] APP"  For established [facility] (colorectal and surgical oncology) patients, please Voalte "Surgery [facility] 1st Call"    APP Visit Information:   APP Service Type:  Shared    The patient did not require critical care.    I spent a total non-overlapping time of 45 minutes in this episode of care preparing to see the patient, obtaining and/or reviewing separately obtained history, performing a medically appropriate examination and/or  evaluation, counseling and educating patient/family/caregiver, ordering and reviewing medications, tests, or procedures, referring and communicating with other health care professionals, documenting clinical information in the electronic or other health record, and communicating results to the patient/family/caregiver, and/or other care coordination.    MDM Complexity Data  Reviewed clinical notes outside my specialty: Pulmonology  No additional complexity of data    MDM Complexity of Problems  No  additional complexity of problems    MDM Complexity Risk  No additional complexity risk

## 2023-11-09 LAB — COMPLETE BLOOD COUNT
Hematocrit: 40.2 % (ref 36.0–46.0)
Hemoglobin: 13.3 g/dL (ref 12.0–15.5)
MCH: 30.8 pg (ref 26.0–34.0)
MCHC: 33.1 g/dL (ref 31.0–36.0)
MCV: 93 fL (ref 80–100)
MPV: 10.6 fL (ref 9.1–12.6)
Platelet Count: 138 10*9/L — ABNORMAL LOW (ref 140–450)
RBC Count: 4.32 10*12/L (ref 4.00–5.20)
RDW-CV: 12.3 % (ref 11.7–14.4)
WBC Count: 4.6 10*9/L (ref 3.4–10.0)

## 2023-11-09 LAB — POCT GLUCOSE
Glucose, Glucometer: 156 mg/dL (ref 70–199)
Glucose, Glucometer: 167 mg/dL (ref 70–199)
Glucose, Glucometer: 182 mg/dL (ref 70–199)
Glucose, Glucometer: 286 mg/dL — ABNORMAL HIGH (ref 70–199)

## 2023-11-09 LAB — BASIC METABOLIC PANEL (NA, K, CL, CO2, BUN, CR, GLU, CA)
Anion Gap: 9 (ref 4–14)
Anion Gap: 11 (ref 4–14)
Calcium, total, Serum / Plasma: 9.4 mg/dL (ref 8.4–10.5)
Calcium, total, Serum / Plasma: 9.4 mg/dL (ref 8.4–10.5)
Carbon Dioxide, Total: 28 mmol/L (ref 22–29)
Carbon Dioxide, Total: 27 mmol/L (ref 22–29)
Chloride, Serum / Plasma: 101 mmol/L (ref 101–110)
Chloride, Serum / Plasma: 102 mmol/L (ref 101–110)
Creatinine: 0.67 mg/dL (ref 0.55–1.02)
Creatinine: 0.72 mg/dL (ref 0.55–1.02)
Glucose, non-fasting: 192 mg/dL (ref 70–199)
Glucose, non-fasting: 169 mg/dL (ref 70–199)
Potassium, Serum / Plasma: 3.6 mmol/L (ref 3.5–5.0)
Potassium, Serum / Plasma: 3.6 mmol/L (ref 3.5–5.0)
Sodium, Serum / Plasma: 138 mmol/L (ref 135–145)
Sodium, Serum / Plasma: 140 mmol/L (ref 135–145)
Urea Nitrogen, serum/plasma: 18 mg/dL (ref 7–25)
Urea Nitrogen, serum/plasma: 15 mg/dL (ref 7–25)
eGFRcr: 99 mL/min/{1.73_m2} (ref >59)
eGFRcr: 94 mL/min/{1.73_m2} (ref >59)

## 2023-11-09 LAB — MAGNESIUM, SERUM / PLASMA
Magnesium, Serum / Plasma: 2.1 mg/dL (ref 1.6–2.6)
Magnesium, Serum / Plasma: 2.2 mg/dL (ref 1.6–2.6)

## 2023-11-09 LAB — PHOSPHORUS, SERUM / PLASMA
Phosphorus, serum/plasma: 3.7 mg/dL (ref 2.3–4.7)
Phosphorus, serum/plasma: 4.6 mg/dL (ref 2.3–4.7)

## 2023-11-09 MED ORDER — OXYCODONE 5 MG TABLET
5 | ORAL_TABLET | Freq: Two times a day (BID) | ORAL | 0 refills | Status: DC | PRN
Start: 2023-11-09 — End: 2023-11-19

## 2023-11-09 MED ORDER — GABAPENTIN 300 MG CAPSULE
300 | ORAL_CAPSULE | Freq: Every day | ORAL | 1 refills | Status: DC
Start: 2023-11-09 — End: 2023-11-19

## 2023-11-09 MED ORDER — LOSARTAN 25 MG TABLET
25 | Freq: Every day | ORAL | 3.00 refills | 30.00000 days | Status: AC
Start: 2023-11-09 — End: ?

## 2023-11-09 MED ORDER — APIXABAN 5 MG TABLET
5 | ORAL_TABLET | ORAL | 0 refills | 30.00000 days | Status: DC
Start: 2023-11-09 — End: 2023-11-09
  Filled 2023-11-09: qty 204, 96d supply, fill #0

## 2023-11-09 MED ORDER — GLUCOSAMINE-CHONDROITIN 500 MG-400 MG CAPSULE
500-400 | Freq: Two times a day (BID) | ORAL | 0.00 refills | Status: AC
Start: 2023-11-09 — End: ?

## 2023-11-09 MED ORDER — LACTULOSE 10 GRAM/15 ML ORAL SOLUTION
1015 | Freq: Every day | ORAL | 1 refills | Status: DC
Start: 2023-11-09 — End: 2024-02-09

## 2023-11-09 MED ORDER — ACETAMINOPHEN 500 MG TABLET
500 | ORAL_TABLET | Freq: Three times a day (TID) | ORAL | 0 refills | Status: DC
Start: 2023-11-09 — End: 2024-02-09

## 2023-11-09 MED ORDER — METOPROLOL TARTRATE 25 MG TABLET
25 | ORAL_TABLET | Freq: Two times a day (BID) | ORAL | 2 refills | 90.00000 days | Status: DC
Start: 2023-11-09 — End: 2024-11-03

## 2023-11-09 MED ORDER — URSODIOL 300 MG CAPSULE
300 | ORAL_CAPSULE | Freq: Two times a day (BID) | ORAL | 5 refills | 30.00 days | Status: DC
Start: 2023-11-09 — End: 2024-05-08

## 2023-11-09 MED ADMIN — potassium chloride (KLOR-CON) packet 40 mEq: 40 meq | ORAL | @ 17:00:00 | NDC 60219161701

## 2023-11-09 MED FILL — GABAPENTIN 300 MG/6 ML (6 ML) ORAL SOLUTION: 300 300 mg/6 mL (6 mL) | ORAL | Qty: 12

## 2023-11-09 MED FILL — ACETAMINOPHEN 650 MG/20.3 ML ORAL SOLUTION: 650 mg/20.3 mL | ORAL | Qty: 40.6

## 2023-11-09 MED FILL — POTASSIUM CHLORIDE 20 MEQ ORAL PACKET: 20 20 mEq | ORAL | Qty: 2 | Fill #0

## 2023-11-09 MED FILL — ONDANSETRON HCL (PF) 4 MG/2 ML INJECTION SOLUTION: 4 4 mg/2 mL | INTRAMUSCULAR | Qty: 2

## 2023-11-09 MED FILL — FUROSEMIDE 10 MG/ML INJECTION SOLUTION: 10 10 mg/mL | INTRAMUSCULAR | Qty: 2

## 2023-11-09 MED FILL — IPRATROPIUM 0.5 MG-ALBUTEROL 3 MG (2.5 MG BASE)/3 ML NEBULIZATION SOLN: 0.5 0.5 mg-3 mg(2.5 mg base)/3 mL | RESPIRATORY_TRACT | Qty: 3 | Fill #0

## 2023-11-09 MED FILL — ACETAMINOPHEN 650 MG/20.3 ML ORAL SOLUTION: 650 650 mg/20.3 mL | ORAL | Qty: 40.6 | Fill #0

## 2023-11-09 MED FILL — ACETAMINOPHEN 650 MG/20.3 ML ORAL SOLUTION: 650 650 mg/20.3 mL | ORAL | Qty: 40.6

## 2023-11-09 MED FILL — IPRATROPIUM 0.5 MG-ALBUTEROL 3 MG (2.5 MG BASE)/3 ML NEBULIZATION SOLN: 0.5 0.5 mg-3 mg(2.5 mg base)/3 mL | RESPIRATORY_TRACT | Qty: 3

## 2023-11-09 MED FILL — SENNA LAX 8.6 MG TABLET: 8.6 8.6 mg | ORAL | Qty: 1 | Fill #0

## 2023-11-09 MED FILL — ELIQUIS 5 MG TABLET: 5 mg | ORAL | Qty: 2

## 2023-11-09 MED FILL — SENNA LAX 8.6 MG TABLET: 8.6 8.6 mg | ORAL | Qty: 2

## 2023-11-09 MED FILL — LANSOPRAZOLE 30 MG DELAYED RELEASE,DISINTEGRATING TABLET: 30 30 mg | ORAL | Qty: 1 | Fill #0

## 2023-11-09 MED FILL — METOPROLOL TARTRATE 25 MG TABLET: 25 mg | ORAL | Qty: 1

## 2023-11-09 NOTE — Plan of Care (Signed)
 Problem: Discharge Planning - Adult  Goal: Knowledge of and participation in plan of care  Outcome: Progress within 12 hours     Problem: Activity Intolerance - Abdominal / GI / GU Surgery Patient - Adult  Goal: Improved activity tolerance ( return to admit level or baseline )  Outcome: Progress within 12 hours  Goal: Able to perform physical activity as ordered  Outcome: Progress within 12 hours     Problem: Airway, Ineffective - Abdominal / GI / GU Surgery Patient - Adult  Goal: Patent airway / effective airway clearance  Outcome: Progress within 12 hours     Problem: Nutrition, Alteration in - Abdominal / GI / GU Surgery Patient - Adult  Goal: Adequate nutritional intake  Outcome: Progress within 12 hours  Goal: Maximize nutritional intake per patient condition  Outcome: Progress within 12 hours     Problem: Fall, at Risk or Actual - Adult  Goal: Absence of falls and fall related injury  Outcome: Progress within 12 hours  Goal: Knowledge of fall prevention  Outcome: Progress within 12 hours

## 2023-11-09 NOTE — Progress Notes (Signed)
SURGERY PROGRESS NOTE     Problem-based Assessment & Plan  Susan Goodwin is a 62 y.o. 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 Aundria Rud. Hospital course notable for acute pulmonary edema requiring diuresis and hypoxic respiratory insufficiency requiring supplemental oxygen.     #S/p laparoscopic Roux-En-Y gastric bypass  - Stage II bariatric diet  - All tablets crushed. Capsules okay.  - Continue PO APAP, gabapentin, lidocaine patch, and crushed oxycodone. Avoid IV narcotics. Baclofen previously discontinued d/t blurry vision,  - Scheduled Zofran   - FSBS and RISS  - Lansoprazole  - SCDs  - SQH  - Penrose drain to be removed on the day of discharge  - PT eval  - Abdominal binder  - Bowel regimen: Miralax and senna.      #OSA on CPAP (non-compliant with use at home)  #Hypoxic respiratory insufficiency, likely chronic, likely in part due to obesity hypoventilation syndrome, requiring supplemental oxygen, ICD 10 G47.35   #Acute pulmonary edema  #Acute subsegmental pulmonary emboli  - Appreciate Pulmonary recommendations  - CXR 12/11 w/ pulmonary edema and low lung volumes  - CTPE 12/14 w/ several scattered subsegmental pulmonary emboli  - Continue Eliquis 10 mg BID x 7 days followed by 5 mg BID  - Anticoagulation education prior to discharge  - Lung expansion pathway  - Encourage ambulation  - Encourage incentive spirometry  - CPAP for OSA  - Duonebs PRN  - CPO  - Strict I&O  - Plan for outpatient PFTs: Spirometry and DLCO + sleep medicine follow up.  - Supplemental oxygen to keep O2 sat >94%  - TTE 12/16 w/ hyperdynamic LV, EF 70-75%, pulmonary artery systolic pressure cannot be determined, right ventricle is not well seen    - Will consider upper extremity DVT US given (limited) negative BLE  - Goal net neg at least 0.5 L daily.   - Ambulatory O2 studies 12/12 for anticipated discharge home with  O2:  SpO2 on room air at rest: 81%  SpO2 96% on 6LNC  SpO2 with ambulation on room air: 79%  SpO2 while ambulating on room air on 6 LPM of oxygen improves to: 90%  - Repeat ambulatory O2 studies prior to discharge  - CM for DME (adapter for O2 into CPAP)     #HTN  - Hold home furosemide and losartan  - Continue metoprolol     #T2DM  - Hold home metformin, Humulin, pioglitazeone  - ISS Resistant for glucose >180  - Plan to resume metformin and pioglitazone on discharge     #HLD  - Continue atorvastatin    24 Hour Course  Lactulose 10 g, bisacodyl supp. BM x 2  Hep gtt discontinued, started on Eliquis.  Lasix 20 mg IV x 2. Net get 1.5L.  TTE completed, results as above.   This AM, O2 needs increased, 3L--->4L    Subjective  Continued epistaxis  No blood in stool  Normal BMs  Breathing well    Vitals  Temp:  [36.6 C (97.9 F)-37.2 C (99 F)] 36.9 C (98.4 F)  Heart Rate:  [71-86] 84  *Resp:  [18] 18  BP: (117-137)/(53-68) 127/53  SpO2:  [94 %-96 %] 95 %    Input / Output  I/O last 2 completed shifts plus current shift:  In: 1358.18 [P.O.:1280; I.V.:78.18]  Out: 2650 [Urine:2650]    Physical Exam  Physical Exam  Constitutional:       General: She is not in acute distress.     Appearance: She is obese. She is not ill-appearing.   Cardiovascular:      Rate and Rhythm: Normal rate.   Pulmonary:      Effort: Pulmonary effort is normal. No respiratory distress.      Comments: 4L NC 96%  Abdominal:      General: There is no distension.      Palpations: Abdomen is soft.      Tenderness: There is abdominal tenderness (Appropriate post-op tenderness).      Comments: Laparoscopic incisions glued and open to air. No surrounding erythema, increased warmth, or drainage. No surrounding erythema, increased warmth, or drainage. Mild bruising. LUQ Penrose drain covered with gauze and Tegederm, CDI.    Skin:     General: Skin is warm and dry.   Neurological:      Mental Status: She is alert and oriented to person, place, and time.    Psychiatric:         Mood and Affect: Mood normal.         Behavior: Behavior normal.     Data  CBC        11/09/23  0654 11/08/23  0253 11/07/23  0627   WBC 4.6 6.0 5.7   HGB 13.3 12.9 12.7   HCT 40.2 39.7 38.1   PLT 138* 126* 127*       Radiology Results  No results found.    Nutritional Assessment  Body mass index is 54.34 kg/m. This represents severe obesity per NIH guidelines.    During this hospitalization the patient is also being treated for:   -acute pulmonary emboli  - Diabetes type 2, SSI changed to resistant    - Pulmonary edema: acute, CXR, dec IVF, LEP/ICS, supplemental oxygen, CPO, diuresis  - Hypoxic respiratory insufficiency, likely chronic, likely in part due to obesity hypoventilation syndrome, requiring supplemental oxygen, ICD 10 G47.35     Code Status: FULL      This note represents the assessment and plan performed and formulated by the rounding surgical team.      Lilly Cove, NP  General Surgery  11/09/23      For new general surgery consults at [place], please Voalte "Consult Acute Care Surgery 1st Call Parn"  For established Acute Care Surgery patients, please Voalte "Acute Care Surgery 1st Call Parn"  For established [facility] patients, please Voalte "Surgery [facility] 1st Call"  For established [facility] (MIS/bariatric surgery) patients, please Voalte "Surgery [facility] 1st Call" or "Surgery [facility] APP"  For established [facility] (colorectal and surgical oncology) patients, please Voalte "Surgery [facility] 1st Call"    APP Visit Information:   APP Service Type:  Shared    The patient did not require critical care.    I spent a total non-overlapping time of 50 minutes in this episode of care preparing to see the patient, obtaining and/or reviewing separately obtained history, performing a medically appropriate examination and/or evaluation, counseling and educating patient/family/caregiver, ordering and reviewing medications, tests, or procedures, referring and communicating with other health care  professionals, documenting clinical information in the electronic or other health record, and communicating results to the patient/family/caregiver, and/or other care coordination.    MDM Complexity Data  No additional complexity of data reviewed  No additional complexity of data    MDM Complexity of Problems  No additional complexity of problems    MDM Complexity Risk  No additional complexity risk

## 2023-11-09 NOTE — Consults (Signed)
PULMONARY FOLLOW-UP CONSULT NOTE     Assessment and Recommendations  Susan Goodwin is a 62 y.o. woman with asthma, severe OSA (not regularly on CPAP, but has machine), HTN, GERD, and T2DM, obesity (BMI 57), who is now POD4 s/p roux-en-Y gastric bypass, with a persistent oxygen requirement. Pulmonary is consulted regarding new hypoxemia.    Hypoxemic respiratory failure in the post-operative setting had been thought multifactorial: atelectasis, splinting, mild pulmonary obesity, suspected OHS, and increases to V/Q mismatch from obesity. Post operatively, Susan Goodwin was also found to have multiple subsegmental PEs without R heart strain (neg BNP, neg trop, no CT evidence of RHS) which may contribute as well. Now on apixaban for therapy and she needs outpatient follow up with hematology to determine duration of AC. She is experiencing significant epistaxis likely iso AC and drying nasal cannula, recommend ENT consult + nasal saline and vaseline. Recommend outpatient referral to hematology for determination of duration of AC and need for hypercoag workup. Her overall picture raises concern for pHTN as a component of her hypoxemia. Attempted assessment with TTE which was limited by habitus, however RV function appears normal. She does not appear volume overloaded. Recommend pulmonary referral with outpatient PFTs with DLCO, and if DLCO is low, a repeat TTE is indicated for reassessment of pHTN. Would add bubble study for concurrent shunt evaluation if persistently hypoxemic as well.    Recommendations  - Recommend ENT consult   - Continue nasal saline and can try nasal petroleum to reduce drying with nasal cannula  - Agree with AC, recommend outpatient hematology referral  - Agree with use of CPAP for OSA/suspected OHS   - Prior to discharge, need to be sure she can bleed O2 into her CPAP at home. Please confirm she has adapter with case management and/or RT if home machine is available  - Likely needs need bilevel  in the long term given elevated bicarbonate in context of obesity. She should follow up with established outpatient sleep providers to discuss this  - Recommend local pulmonary referral and outpatient spirometry with DLCO once acute illness resolved for full evaluation of hypoxemia. If DLCO is low, recommend repeat TTE  - Encourage mobility: out of bed to chair for all meals, walk as frequently as possible  - Incentive spirometer at bedside; we encouraged her to use hourly    Susan Goodwin  Pulmonary Fellow   11/09/2023    Thank you for this consult, we will follow. Recommendations have been communicated to the primary team. Patient seen and discussed with Susan Goodwin of the general pulmonary consult service.    Please feel free to contact the Pulmonary consult service through [facility] (Consult Pulmonary 1st Call Parn) with any questions or concerns.    Interval Events/Subjective  - Nearing discharge, continues to have O2 requirement. Minimal other respiratory symptoms  - Having significant epistaxis with clotting blood in bilateral narse  - Has only walked twice since surgery    Scheduled Meds:   0.9% sodium chloride flush  3 mL Intravenous Q12H SCH    acetaminophen  1,000 mg Oral Q6H    apixaban  10 mg Oral BID SCH    Followed by    Melene Muller ON 11/15/2023] apixaban  5 mg Oral BID SCH    bisacodyL  10 mg Rectal Once    fluticasone propionate  1 spray Each Nostril Daily SCH    gabapentin  600 mg Oral Daily At Bedtime [facility]    insulin aspart  U-100  0-60 Units Subcutaneous TID [facility] SCH    insulin aspart U-100  0-9 Units Subcutaneous Daily At Bedtime [facility]    ipratropium-albuterol  3 mL Nebulization BID    lansoprazole  30 mg Oral Q AM Before Breakfast SCH    lidocaine  2 patch Topical Daily SCH    metoprolol tartrate  12.5 mg Oral BID SCH    ondansetron  4 mg Intravenous Q6H    potassium chloride  40 mEq Oral Once    senna  17.2 mg Oral Daily At Bedtime [facility]    senna  8.6 mg Oral Once     Continuous  Infusions:      PRN Meds:   sodium chloride  1 spray Each Nostril Q6H PRN    0.9% sodium chloride flush  3 mL Intravenous PRN    albuterol  2 puff Inhalation Q6H PRN    bisacodyL  10 mg Rectal Daily PRN    dextrose 50%  12.5 g Intravenous Q15 Min PRN    Or    glucose  16 g Oral Q15 Min PRN    Or    juice (for hypoglycemia)  16 g Oral Q15 Min PRN    insulin aspart U-100  0-9 Units Subcutaneous Early 2 AM PRN    oxyCODONE  2.5-5 mg Oral Q6H PRN    polyethylene glycol  17 g Oral Daily PRN    prochlorperazine  5-10 mg Intravenous Q6H PRN     Data  Vital Sign Min/Max (last 24 hours)       Value Min Max    Temp 36.6 C (97.9 F) 37.2 C (99 F)    Heart Rate 71 86    *Resp 18 20    BP: Systolic 114 137    BP: Diastolic 45 68    MAP (mmHg) 64 mmHg 86 mmHg    SpO2 94 % 96 %          Weight: (!) 134.8 kg (297 lb 1.6 oz)     O2 Device: Nasal cannula  O2 Flow Rate (L/min): 4 L/min  FiO2 (%): 100 %     I/O         12/13 0701  12/14 0700 12/14 0701  12/15 0700 12/15 0701  12/16 0700 12/16 0701  12/17 0700 12/17 0701  12/18 0700    P.O. 1050 1320 1010 1080 200    I.V. (mL/kg)  66.6 (0.47) 321.93 (2.28) 75.18 (0.53) 3 (0.02)    Total Intake(mL/kg) 1050 (7.42) 1386.6 (9.8) 1331.93 (9.41) 1155.18 (8.16) 203 (1.43)    Urine (mL/kg/hr) 2600 (0.77) 950 (0.28) 2150 (0.63) 2650 (0.78)     Drains/NG     0    Other     0    Stool 150 0  0     Total Output(mL/kg) 2750 (19.43) 950 (6.71) 2150 (15.19) 2650 (18.73) 0 (0)    Net -1700 +436.6 -818.07 -1494.82 +203             Unmeasured Urine Occurrence 2 x 1 x  1 x     Unmeasured Stool Occurrence 3 x 1 x  2 x           Physical Exam  Gen: Obese woman, well-appearing, sitting upright in a chair  HEENT: NCAT  CV: RRR  Pulm: CTAB  Abd: Soft, non-distended  Ext: Warm, trace edema  Neuro: Oriented xs 3    Labs   140 102 15 / 169  3.6 27 0.72 \     12/17 0654     4.6 \ 13.3 / 138*    / 40.2 \    12/17 0654       Recent Lab Values         11/09/2023 11/08/2023 11/08/2023 11/07/2023 11/07/2023  11/07/2023 11/06/2023 11/05/2023      6:54 AM  7:53 PM  2:53 AM 12:52 PM  6:27 AM 12:19 AM  7:19 AM  6:34 AM    WBC 4.6 -- 6.0 -- 5.7 5.8 6.0 6.3    HGB 13.3 -- 12.9 -- 12.7 12.7 12.8 12.0    HCT 40.2 -- 39.7 -- 38.1 39.5 39.5 37.8    PLT 138 -- 126 -- 127 122 125 109    INR -- -- -- -- -- 1.1 -- --    NA 140 138 -- 142 -- -- 144 143    K 3.6 3.6 -- 3.9 -- -- 3.4 3.6    CO2 27 28 -- 27 -- -- 30 28    CA 9.4 9.4 -- 9.4 -- -- 8.9 8.4    BUN 15 18 -- 13 -- -- 9 6    CREAT 0.72 0.67 -- 0.65 -- -- 0.76 0.67    GLU 169 192 -- 159 -- -- 156 179    MG 2.2 2.1 -- -- -- -- 2.0 2.0    PO4 4.6 3.7 -- -- -- -- 3.4 2.8    CL 102 101 -- 104 -- -- 103 106    PT -- -- -- -- -- 13.8 -- --          No results found for: "NEUTA", "EOA"  No results found for: "PH37"  No results found for: "PCO2" No results found for: "PO2" No results found for: "FIO2"     Lab Results   Component Value Date    AST 23 09/22/2023    Alanine transaminase 25 09/22/2023    Alkaline Phosphatase 48 09/22/2023    Bilirubin, Total 0.3 09/22/2023     Lab Results   Component Value Date    PT 13.8 11/07/2023    aPTT 59.8 (H) 11/08/2023    INR 1.1 11/07/2023    INR 1.2 11/04/2023    INR 1.0 09/22/2023     BNP   Date Value Ref Range Status   11/07/2023 12 <82 pg/mL Final     Thyroid Stimulating Hormone   Date Value Ref Range Status   09/22/2023 2.61 0.45 - 4.12 mIU/L Final     No components found for: "LDH"  No results found for: "GUA", "BIUA", "KEUA", "SGUA", "HBUA", "PHUA", "PRUA", "NIUA", "WEUA", "UROB", "SEDUA"  No results found for: "ANA", "ANAP", "ANAT", "ANAEXL", "ANATSIEXQ", "ANAPSEXQ", "CYCP", "CCPABEXL", "CCPABIGGEXQ", "RF", "RFT", "RFEXL", "SM", "SMRNPEXQ", "RNP", "RNPEXL", "RNPABEXQ", "RNPIGG", "RNPQ", "DSDNA", "DSDNAEXQ", "SSA", "SSAEXQ", "SSB", "SSBEXQ", "AENA", "AENAEXQ", "AP3A", "AP3AEXQ", "AMYE", "AMYEEXQ", "SCL70I", "SCL70", "SCL7", "AGBM"  No results found for: "MYS1", "MYS1A", "MYS2", "JO1", "JO1AB", "MYS3", "MYS4", "MYS5", "MYS6", "MYS7", "MYS8",  "MYS9", "MYS10", "MYS11", "MYS12", "MYS13", "MYS14", "MYS15", "MYS16", "MYS17", "MYS18", "MYS19"  Lab Results   Component Value Date    Ferritin, Serum/Plasma 18 09/22/2023     No results found for: "IGM", "IGA", "IGE"    Pleural Fluid Studies  No results found for: "BFT", "APB", "COLB", "VISB", "WCB", "RCB", "HBF", "LB", "MHMB", "OCB", "CSB", "BADI", "GLBF", "TPBF", "PHB", "LDB", "ALBB", "TGBF"  No results found for: "BFT", "APB", "COLB", "VISB", "WCB", "RCB", "HBF", "LB", "MHMB", "OCB", "CSB", "BADI", "GLBF", "TPBF", "  PHB", "LDB", "ALBB", "TGBF"      Microbiology  Microbiology Results (last 72 hours)       ** No results found for the last 72 hours. **           Imaging   US Doppler Lower Extremity Venous, Bilateral    Result Date: 11/07/2023  Limited by nonvisualization of the right peroneal vein due to poor acoustic attenuation.  Within this limit, negative examination for deep venous thrombosis in the bilateral lower extremities from the common femoral vein through the ankle. Report dictated by: Philis Fendt, Goodwin, signed by: Linde Gillis, Goodwin Department of Radiology and Biomedical Imaging     CTA Chest Pulmonary Embolism (CTPE)    Result Date: 11/07/2023  Several scattered subsegmental pulmonary emboli as described above. Enlarged pulmonary artery disproportionate to the size and number of pulmonary emboli. //Impression 1 secure messaged to  Susan Hodgkins, NP (General Surgery) by Dr. Esmeralda Links, Goodwin (Radiology) on 11/06/2023 10:35 PM.// Report dictated by: Susan Links, Goodwin, signed by: Susan Bodo, Goodwin Department of Radiology and Biomedical Imaging   PFTs       No data to display              TTE  Results for orders placed or performed during the hospital encounter of 11/02/23   TransThoracic Echo Today    Narrative     [facility]           [address]        [place], [place] [address]  Phone: [phone] Fax:  [phone]         TRANSTHORACIC ECHOCARDIOGRAM    Patient Name: Susan Goodwin Patient QV:[id number]  Date of Report: 11/08/2023      Patient DOB: 10-12-1961  Referring Phys: [id number] NHU TRAN Height: 158.0 cm Weight:142.0 kg  Referring Diag: I26.99          Gender:F BSA:2.32  Indication:PE                   Resting BP:113/103 mmHg  Reading Goodwin JOACZY:[id number]        Patient Last Name:Marchesi  Sonographer: Susan Goodwin        Order [id number]  Exam Location: I                Visit Number: [id number]  Image Quality: Poor             Study Type: TTE  Exam Date:11/08/2023 Exam Time: 1:24:16 PM     Study Location: [id number]       Conclusions:  Due to technical issues, the quality of the study was limited. The patient's blood pressure was 113 mmHg/103 mmHg during the study. The heart rate during the study was 81. Color Flow Doppler was utilized for this exam. Spectral Doppler was utilized for   this exam.  1. The left ventricular volume is normal. LV function is hyperdynamic. 2D LV ejection fraction is estimated to be 70 to 75%. There is no left ventricular hypertrophy. No segmental wall motion abnormalities present.  2. The right ventricular volume is mildly increased. The right ventricle is not well seen but probably normal in function.  3. Left atrial size is normal. The right atrium is not well seen.  4. There is no hemodynamically significant valvular disease.  5. Diastolic function is indeterminate.  6. The pulmonary artery systolic pressure cannot be determined due to the lack of a complete TR jet.  7. No pericardial effusion noted. The IVC is not well seen and RA pressure cannot be estimated.  8. Aortic root dimension is normal.  9. No evidence of early or late shunting was seen with right sided saline contrast administration.                  Previous Comparison:  No previous study is available for comparison.       Cardiac Chambers:  Left Ventricle: The left ventricular volume is normal. LV function is  hyperdynamic. 2D LV ejection fraction is estimated to be 70 to 75%. There is no left ventricular hypertrophy. No segmental wall motion abnormalities present.  LV Diastolic Function: Diastolic function is indeterminate.  Right Ventricle: The right ventricular volume is mildly increased. The right ventricle is not well seen but probably normal in function. RV S' velocity is 12 cm/s.  Left Atrium:Left atrial size is normal.  Right Atrium:The right atrium is not well seen.     Cardiac Valves:  Aortic Valve: The aortic valve was not well visualized. No aortic regurgitation is present.  Mitral Valve: The mitral valve anatomy and motion are normal. There is mild mitral annular calcification. Trace / physiologic mitral regurgitation is present.  Tricuspid Valve: The tricuspid valve is not well seen. There is trace tricuspid regurgitation.  Pulmonic Valve: The pulmonary valve is not well seen. There is no pulmonic regurgitation.     Other:  Pulmonary Artery and Right Sided Pressures: The pulmonary artery systolic pressure cannot be determined due to the lack of a complete TR jet.  Aorta: Aortic root dimension is normal. The ascending aorta appears normal in size.  Pericardium: No pericardial effusion noted.  IVC and Hepatic Veins: The IVC is not well seen and RA pressure cannot be estimated.  Other: Saline Contrast was used to assess for shunt. No evidence of early or late shunting was seen with right sided saline contrast administration.     Measurements:    Variable (Normal Range)         Value     2D Measures (Normal Range)      Value  LVEDVI, mL/m2                   38 ml/m  LVIDd, cm                       4.6 cm  (35-75 men , 29-61 women )                (4.2-5.8 men, 3.8-5.2 women)  LVESVI, mL/m2                   10 ml/m  LVIDs, cm                       2.4 cm  (11-31 men, 8-24 women)                   (2.5-4.0 men, 2.2-3.5 women)  2D LV Ejection Fraction         74 %      2D Septal thickness, cm         1.2  cm  (55-70%)  3D LV Ejection Fraction                   2D Posterior wall  dimension     1.2 cm  (55-70%)                                  (cm)  LV Mass Index (TE or A/L),      82 g/m   2D Septal/Post Wall Ratio       1  gm/m2  (50-102, men, 44-88 women)  Aortic Root Size (2D), (2.0-3.7 3.6 cm    ASC AO                          3.1 cm  cm):  LA Volume Index (16-34 ml/m2):  28 ml/m  PV VTI (cm):                    22 cm  LA A/P s, 2D                    4.2 cm    RVOT VTI (14-16 cm):            18 cm                                            LVOT VTI (19-21 cm):            17 cm                                            TAPSE (>1.6 cm):                2.0 cm    (Reference values taken from J Am Soc Echocardiogr 2015;28:1-39)     ___________________________________  Electronically signed on 12/16/2024by  Reading Physician: Patrcia Dolly Goodwin  Procedure Codes:Contrast Enhanced Complete echo-[id number]         +++ Final +++     Cardiac Cath  No results found for this or any previous visit.

## 2023-11-09 NOTE — Interdisciplinary (Addendum)
 CASE MANAGEMENT DISCHARGE NOTE     FINAL DISCHARGE NOTE    CASE MANAGEMENT DISCHARGE (most recent)       Discharge Note Flowsheet - 10/13/23          Final Discharge Note    *Primary Case Manager (First and Last Name) Heidi Dach     Final Discharge Disposition Home Health Care (Non [facility])     Discharge Planning Needs Complete? Yes     Skilled or Acute needs DME;Physical Therapy     Discharge DME Oxygen     Patient Choice Choice of providers discussed with patient and/or designee.  Patient, family or legal decision maker, and team are in agreement with this discharge plan     CD images provided for treatment purposes? No     Patient/Parent/Surrogate Decision Maker agrees with the plan Yes        Durable Medical Equipment    Additional Instructions Per [employer] rep: Pt received  O2 tanks delivered at bs and fam was contacted for Hm O2 delivery .        Home Care    Additional Instructions A home health representative will contact you to arrange home visits. If you have not heard from the Home Health within 48hrs after your hospital discharge, please contact the Home Health agency.        Transportation Arrangements    Date of Transport 11/09/23     Final transportation arrangements Self/Family/Caregiver (no assistance needed)                   CONTINUED CARE and SERVICES COORDINATION  Durable Medical Equipment - Admitted Since 11/02/2023       Service Provider Services Address Phone Fax    [facility], an [employer] company - [facility] [address] [place], [place] [address] [phone]          Home Medical Care - Admitted Since 11/02/2023       Service Provider Services Address Phone Fax    [facility] [address], [place] [address] [phone]            NOTES  DME: O2 tanks delivered at bs last wk.   UU:[id number] upated re: DC for today and HH orders on  AS    I spoke with pt's husband Fusto and he confirms tanks and hm concentrator were delivered and accepted and he already received a call from the [facility] agency for hm visits.     Heidi Dach RN   Case Mgr for Gen Surg ( [facility], [facility]), Plastics, OMFS, and 11 ICU primary teams.

## 2023-11-09 NOTE — Plan of Care (Signed)
 Problem: Discharge Planning - Adult  Goal: Knowledge of and participation in plan of care  Outcome: Adequate for Discharge     Problem: Activity Intolerance - Abdominal / GI / GU Surgery Patient - Adult  Goal: Improved activity tolerance ( return to admit level or baseline )  Outcome: Adequate for Discharge  Goal: Able to perform physical activity as ordered  Outcome: Adequate for Discharge     Problem: Airway, Ineffective - Abdominal / GI / GU Surgery Patient - Adult  Goal: Patent airway / effective airway clearance  Outcome: Adequate for Discharge     Problem: Nutrition, Alteration in - Abdominal / GI / GU Surgery Patient - Adult  Goal: Adequate nutritional intake  Outcome: Adequate for Discharge  Goal: Maximize nutritional intake per patient condition  Outcome: Adequate for Discharge     Problem: Fall, at Risk or Actual - Adult  Goal: Absence of falls and fall related injury  Outcome: Adequate for Discharge  Goal: Knowledge of fall prevention  Outcome: Adequate for Discharge

## 2023-11-09 NOTE — Telephone Encounter (Signed)
 Confirmed Prescription Assistance Program @ [facility]          Request Sent: 11/09/23 by Melina Schools via email    Valid: 11/09/23 to 11/23/23    Drug: Eliquis  Prescriber: Lilly Cove  [employer] (not required for discharge): Discharge pt, donut hole, unlikely to be eligible for continued enrollment

## 2023-11-10 LAB — POCT GLUCOSE
Glucose, Glucometer: 145 mg/dL (ref 70–199)
Glucose, Glucometer: 162 mg/dL (ref 70–199)
Glucose, Glucometer: 182 mg/dL (ref 70–199)
Glucose, Glucometer: 196 mg/dL (ref 70–199)

## 2023-11-10 MED ORDER — INSULIN GLARGINE (U-100) 100 UNIT/ML (3 ML) SUBCUTANEOUS PEN
1003 | Freq: Every day | SUBCUTANEOUS | 0 refills | Status: DC
Start: 2023-11-10 — End: 2024-02-09

## 2023-11-10 MED FILL — GABAPENTIN 300 MG/6 ML (6 ML) ORAL SOLUTION: 300 300 mg/6 mL (6 mL) | ORAL | Qty: 12 | Fill #0

## 2023-11-10 MED FILL — ONDANSETRON HCL (PF) 4 MG/2 ML INJECTION SOLUTION: 4 4 mg/2 mL | INTRAMUSCULAR | Qty: 2 | Fill #0

## 2023-11-10 MED FILL — SENNA LAX 8.6 MG TABLET: 8.6 8.6 mg | ORAL | Qty: 2 | Fill #0

## 2023-11-10 MED FILL — LANSOPRAZOLE 30 MG DELAYED RELEASE,DISINTEGRATING TABLET: 30 30 mg | ORAL | Qty: 1 | Fill #0

## 2023-11-10 MED FILL — LIDOCAINE 4 % TOPICAL PATCH: 4 4 % | TOPICAL | Qty: 2 | Fill #0

## 2023-11-10 MED FILL — IPRATROPIUM 0.5 MG-ALBUTEROL 3 MG (2.5 MG BASE)/3 ML NEBULIZATION SOLN: 0.5 0.5 mg-3 mg(2.5 mg base)/3 mL | RESPIRATORY_TRACT | Qty: 3 | Fill #0

## 2023-11-10 NOTE — Progress Notes (Addendum)
SURGERY PROGRESS NOTE     Problem-based Assessment & Plan  Susan Goodwin is a 62 y.o. 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 Aundria Rud. Hospital course notable for acute pulmonary edema requiring diuresis and hypoxic respiratory insufficiency requiring supplemental oxygen.     #S/p laparoscopic Roux-En-Y gastric bypass  - Stage II bariatric diet  - All tablets crushed. Capsules okay.  - Continue PO APAP, gabapentin, lidocaine patch, and crushed oxycodone. Avoid IV narcotics. Baclofen previously discontinued d/t blurry vision,  - Scheduled Zofran   - FSBS and RISS  - Lansoprazole  - SCDs  - SQH  - Penrose drain to be removed on the day of discharge  - PT eval  - Abdominal binder  - Bowel regimen: Miralax and senna.      #OSA on CPAP (non-compliant with use at home)  #Hypoxic respiratory insufficiency, likely chronic, likely in part due to obesity hypoventilation syndrome, requiring supplemental oxygen, ICD 10 G47.35   #Acute pulmonary edema  #Multiple subsegmental thrombotic pulmonary emboli without acute cor pulmonale, ICD10 I26.94  - Appreciate Pulmonary recommendations  - CXR 12/11 w/ pulmonary edema and low lung volumes  - CTPE 12/14 w/ several scattered subsegmental pulmonary emboli  - Continue Eliquis 10 mg BID x 7 days followed by 5 mg BID  - Anticoagulation education prior to discharge  - Lung expansion pathway  - Encourage ambulation  - Encourage incentive spirometry  - CPAP for OSA  - Duonebs PRN  - CPO  - Strict I&O  - Plan for outpatient PFTs: Spirometry and DLCO + sleep medicine follow up.  - Supplemental oxygen to keep O2 sat >94%  - TTE 12/16 w/ hyperdynamic LV, EF 70-75%, pulmonary artery systolic pressure cannot be determined, right ventricle is not well seen    - Ambulatory O2 studies on 12/17:   - SpO2 with ambulation on RA 94-96%  - Ambulatory O2 studies on 12/18:  - SpO2 on room air  at rest of 99%  - SpO2 with ambulation on RA 93%, with 2L NC improved to 99%.  - Overnight 12/17-12/18: SpO2 83% while sleeping on with 3LNC in place, longer than 5 mins total. O2 increased to 4LNC with subsequent O2 sats remaining above 92%.   - CM for home nocturnal O2, DME (adapter for O2 into CPAP)     #HTN  - Hold home furosemide and losartan  - Continue metoprolol     #T2DM  - Hold home metformin, Humulin, pioglitazeone  - ISS Resistant for glucose >180  - Plan to discharge home on glargine 8 units daily     #HLD  - Continue atorvastatin    24 Hour Course  In AM, O2 needs increased, 3L--->4L  Per pulmonology, no need for diuresis  Pt missing connector for home CPAP to bleed in O2.  Continued epistaxis  Ambulatory O2Sat 94-96% on RA    Subjective  Doing okay  Continued epistaxis, a big clot in each of her nares every morning. Smaller volume throughout the day.    Vitals  Temp:  [36.6 C (97.9 F)-37.1 C (98.8 F)] 36.6 C (97.9 F)  Heart Rate:  [68-88] 81  *Resp:  [16-20] 20  BP: (98-128)/(49-65) 128/51  SpO2:  [93 %-100 %] 99 %    Input / Output  I/O last 2 completed shifts plus current shift:  In: 1403 [P.O.:1400; I.V.:3]  Out: 1250 [Urine:1250]  Physical Exam  Physical Exam  Constitutional:       General: She is not in acute distress.     Appearance: She is obese. She is not ill-appearing.   Cardiovascular:      Rate and Rhythm: Normal rate.   Pulmonary:      Effort: Pulmonary effort is normal. No respiratory distress.      Comments: 4L NC 96%  Abdominal:      General: There is no distension.      Palpations: Abdomen is soft.      Tenderness: There is abdominal tenderness (Appropriate post-op tenderness).      Comments: Laparoscopic incisions glued and open to air. No surrounding erythema, increased warmth, or drainage. No surrounding erythema, increased warmth, or drainage. Mild bruising. LUQ Penrose drain covered with gauze and Tegederm, CDI.    Skin:     General: Skin is warm and dry.   Neurological:       Mental Status: She is alert and oriented to person, place, and time.   Psychiatric:         Mood and Affect: Mood normal.         Behavior: Behavior normal.     Data  CBC        11/09/23  0654 11/08/23  0253   WBC 4.6 6.0   HGB 13.3 12.9   HCT 40.2 39.7   PLT 138* 126*       Radiology Results  No results found.    Nutritional Assessment  Body mass index is 54.34 kg/m. This represents severe obesity per NIH guidelines.    During this hospitalization the patient is also being treated for:   -acute pulmonary emboli  - Diabetes type 2, SSI changed to resistant    - Pulmonary edema: acute, CXR, dec IVF, LEP/ICS, supplemental oxygen, CPO, diuresis  - Hypoxic respiratory insufficiency, likely chronic, likely in part due to obesity hypoventilation syndrome, requiring supplemental oxygen, ICD 10 G47.35     Code Status: FULL      This note represents the assessment and plan performed and formulated by the rounding surgical team.      Lilly Cove, NP  General Surgery  11/10/23      For new general surgery consults at [facility], please Voalte "Consult Acute Care Surgery 1st Call [facility]"  For established Acute Care Surgery patients, please Voalte "Acute Care Surgery 1st Call [facility]"  For established [facility] patients, please Voalte "Surgery [facility] 1st Call"  For established [facility] (MIS/bariatric surgery) patients, please Voalte "Surgery [facility] 1st Call" or "Surgery [facility] APP"  For established [facility] (colorectal and surgical oncology) patients, please Voalte "Surgery [facility] 1st Call"    APP Visit Information:   APP Service Type:  Shared    The patient did not require critical care.    I spent a total non-overlapping time of 45 minutes in this episode of care preparing to see the patient, obtaining and/or reviewing separately obtained history, performing a medically appropriate examination and/or evaluation, counseling and educating patient/family/caregiver, ordering and reviewing medications, tests, or procedures, referring and  communicating with other health care professionals, documenting clinical information in the electronic or other health record, and communicating results to the patient/family/caregiver, and/or other care coordination.    MDM Complexity Data  No additional complexity of data reviewed  No additional complexity of data    MDM Complexity of Problems  No additional complexity of problems    MDM Complexity Risk  No additional complexity risk

## 2023-11-10 NOTE — Discharge Summary (Addendum)
 [facility] - DISCHARGE SUMMARY     Patient Name: Susan Goodwin  Patient MRN: [id number]  Date of Birth: Jan 03, 1961    Facility: [facility]  Attending Physician: Alroy Dust, MD    Date of Admission: 11/02/2023  Date of Discharge: 11/10/2023    Admission Diagnosis: Severe obesity (CMS code)  Discharge Diagnosis: Severe obesity (CMS code)    Discharge Disposition: Home    History (with Chief Complaint)    Susan Goodwin is a 62 y.o. female w/ a hx of HTN, HLD, T2DM on insulin, asthma, OSA, GERD, and severe obesity w/ a BMI of 54.34. She presented for elective bariatric surgery.    Brief Hospital Course by Problem    #Severe obesity with a BMI of 54 s/p laparoscopic Roux-En-Y gastric bypass   Susan Goodwin underwent a laparoscopic Roux-en-Y gastric bypass with Dr. Aundria Rud on 11/02/23. She has a 130 cm Roux limb. A 25 mm EEA stapled antecolic antegastric gastrojejunostomy was performed with a resulting negative methylene blue test. The JJ and Petersen's defect were closed with permanent suture. Please see the operative report below for further details. Post operatively, she was extubated, recovered in the PACU, and transferred to the floor in stable condition.    Her postoperative course was notable for acute hypoxic respiratory insufficiency requiring supplemental oxygen (please see below).    She did well from a postoperative standpoint. Her pain was well managed on a multimodal oral regimen. She had return of bowel function and was able to tolerate advancement to a stage II diet. She was able to void after Foley catheter removal and ambulate independently. The patient met with our inpatient nutritionist to discuss the recommended post-bariatric surgery diet. The patient also met with our inpatient pharmacist to discuss any changes to home medication regimen and to review their post-bariatric surgery medication regimen. Her penrose drain was removed and she was discharged home with plans to  follow up in the General Surgery clinic.    #OSA on CPAP (non-compliant with use at home)  #Hypoxic respiratory insufficiency, likely chronic, likely in part due to obesity hypoventilation syndrome, requiring supplemental oxygen, ICD 10 G47.35   #Acute pulmonary edema requiring IV Lasix  #Acute subsegmental pulmonary emboli  Per protocol, she was monitored postoperatively via CPO. She required the use of supplemental O2 to keep saturation above 92%. She was placed on the lung expansion pathway with duonebs. CXR on 12/11 demonstrated pulmonary edema and low lung volumes. Her mIVF were decreased from 100cc and then to 50cc/hr, however she continued to require supplemental oxygen, up to 6L via NC    On 11/06/23 POD #4, CTA chest PE showed multiple segmental and subsegmental scattered pulmonary emboli most proximately in a right lower lobe segmental branch. There was no evidence of heart strain. She was started on a heparin gtt without bolus. She was seen in consultation by Pulmonology. DVT US BLE 12/15 was a poor study. TTE 12/16 showed an EF of 70-75%, however it was unfortunately a limited study and PA systolic pressure could not be determined and the RV was not well visualized. She was eventually transitioned to Eliquis prior to discharge. In addition, she was gently diuresed with several rounds of Lasix 20 mg IV. Per Pulmonology, she should follow up with a local pulmonologist for sleep medicine follow up--she may potentially need bilevel (as opposed to CPAP). Pt was encouraged to use her CPAP until follow up. Susan Goodwin should also have outpatient PFTs with spirometry and  DLCO. If her DLCO is low, it was recommended that she have a repeat TTE for evaluation of pulmonary hypertension. She was also referred to Hematology for work up of hypercoagulability and duration of anticoagulation therapy.    Ambulatory O2 studies on 12/17, pt was able to ambulate on room air and maintain O2 sats between 94-96%. Repeat  ambulatory O2 studies on day of discharge included: SpO2 on room air at rest of 99%, SpO2 with ambulation on RA waas 93% with 2L NC improves to 99%. SpO2 83% while sleeping on with 3LNC in place 12/17-12/18, longer than 5 mins total. Her oxygen was increased to [facility] with sats remaining above 92% thereafter. Susan Goodwin with discharged home with nocturnal supplemental O2, 4L via NC to her CPAP device.    #Epistaxis  The patient developed persistent epistaxis during her hospitalization, thought due to O2 use vs anticoagulation. She was seen by ENT, who recommended standard epistaxis precautions, the main intervention being humidification with nasal cannula use, which was ordered for home use. She was also started on saline nasal sprays TID and Vaseline to the nasal vestibules BID. Pt was also givena prescription for Afrin PRN (2 sprays each nostril and hold pressure on the front soft part of the nose) if she continues to have epistaxis despite these interventions.    #T2DM  The patient's home metformin, Humulin, and pioglitazone were held during her hospitalization and she was managed with a resistant insulin sliding scale. Her glucoses were not well controlled, with fingersticks ranging between 156-286. She was started on glargine 8 units daily on discharge. She will be followed up closely in clinic.    Physical Exam at Discharge  BP 128/51 (BP Location: Left upper arm, Patient Position: Lying)   Pulse 81   Temp 36.6 C (97.9 F) (Oral)   Resp 20   Ht 157.5 cm (5\' 2" )   Wt (!) 134.8 kg (297 lb 1.6 oz)   SpO2 99%   BMI 54.34 kg/m       Intake/Output Summary (Last 24 hours) at 11/10/2023 1454  Last data filed at 11/10/2023 1322  Gross per 24 hour   Intake 1200 ml   Output 1250 ml   Net -50 ml       Physical Exam  Constitutional:       General: She is not in acute distress.     Appearance: She is well-developed. She is obese. She is not diaphoretic.   HENT:      Head: Normocephalic and atraumatic.   Neck:       Trachea: No tracheal deviation.   Pulmonary:      Effort: Pulmonary effort is normal. No respiratory distress.   Abdominal:      General: There is no distension.      Palpations: Abdomen is soft.      Tenderness: There is abdominal tenderness (appropriate incisional tenderness). There is no guarding.      Comments: Laparoscopic incisions glued and open to air. No surrounding erythema, increased warmth, or drainage.   Skin:     General: Skin is warm and dry.   Neurological:      Mental Status: She is alert.   Psychiatric:         Mood and Affect: Mood normal.         Behavior: Behavior normal.       Relevant Labs, Radiology, and Other Studies    Recent Labs     11/09/23  0654 11/08/23  1953  11/08/23  0253 11/07/23  2148   WBC 4.6  --  6.0  --    HGB 13.3  --  12.9  --    HCT 40.2  --  39.7  --    PLT 138*  --  126*  --    NA 140 138  --   --    K 3.6 3.6  --   --    CL 102 101  --   --    CO2 27 28  --   --    BUN 15 18  --   --    CREAT 0.72 0.67  --   --    GLU 169 192  --   --    CA 9.4 9.4  --   --    MG 2.2 2.1  --   --    PO4 4.6 3.7  --   --    PTT  --   --  59.8* 59.8*     US Doppler Lower Extremity Venous, Bilateral    Result Date: 11/07/2023  US DOPPLER LOWER EXTREMITY VENOUS, BILATERAL:  11/07/2023 9:21 AM    INDICATION:    New pulmonary embolisms    COMPARISON: No relevant priors available    FINDINGS:   Multiple transverse and longitudinal high resolution sonograms of the bilateral lower extremities were obtained in conjunction with compression techniques,  limited duplex, and color doppler sonography from the common femoral vein through the posterior tibial and peroneal to the level of the ankle.      RIGHT:    Common femoral: Normal color Doppler flow and compressibility  Femoral: Normal color Doppler flow and compressibility  Popliteal: Normal color Doppler flow and compressibility  Peroneal: Nonvisualization due to body habitus.  Posterior tibial: Normal compressibility    Soft tissues:  Unremarkable      LEFT:    Common femoral: Normal color Doppler flow and compressibility  Femoral: Normal color Doppler flow and compressibility  Popliteal: Normal color Doppler flow and compressibility  Peroneal: Normal compressibility  Posterior tibial: Normal compressibility    Soft tissues: Unremarkable            Limited by nonvisualization of the right peroneal vein due to poor acoustic attenuation.  Within this limit, negative examination for deep venous thrombosis in the bilateral lower extremities from the common femoral vein through the ankle.    Report dictated by: Philis Fendt, MD, signed by: Linde Gillis, MD  Department of Radiology and Biomedical Imaging      CTA Chest Pulmonary Embolism (CTPE)    Result Date: 11/07/2023  CTA CHEST PULMONARY EMBOLISM (CTPE)    CLINICAL HISTORY:  pod4 RYGB now w/persistent oxygen requirement eval for PE    COMPARISON: None    TECHNIQUE: Serial 1.25 mm axial images through the chest were obtained after the administration of intravenous contrast using a pulmonary embolism protocol.    Iohexol 350 - 80 mL - Intravenous    RADIATION DOSE INDICATORS:  Exposure Events: 7 , CTDIvol Min: 15.7 mGy, CTDIvol Max: 15.7 mGy, DLP: 529.7 mGy.cm      The attending physician certifies the medical necessity of the chest CT obtained in this patient according to our "pulmonary embolism" protocol, and its processing.  Chest CT and its processing (creation of maximal intensity projections on the CT scanner console) were required in this patient because they:  -provide accurate assessment of pulmonary arteries  -can confirm or rule out the  presence of central, segmental and/or subsegmental pulmonary embolism  -helps differentiating pulmonary embolism form other mimicking entities such as adjacent veins or lymphadenopathy    FINDINGS:    PULMONARY ARTERIES:  There are several scattered small subsegmental pulmonary emboli. Examples include in the right lower lobe on image 114,  series 2, the right upper lobe on image 185, series 2, the left lower lobe on image 114, series 2, and the left upper lobe on image 177, series 2. The right heart is not enlarged. The main pulmonary artery measures 38 mm.    LUNGS:  The lungs appear clear.    MEDIASTINUM:  No lymphadenopathy.    PLEURA:  No pleural effusions.    BONES AND SOFT TISSUES:   No suspicious osseous lesions.            Several scattered subsegmental pulmonary emboli as described above. Enlarged pulmonary artery disproportionate to the size and number of pulmonary emboli.    //Impression 1 secure messaged to  Murray Hodgkins, NP (General Surgery) by Dr. Esmeralda Links, MD (Radiology) on 11/06/2023 10:35 PM.//    Report dictated by: Esmeralda Links, MD, signed by: Dorena Bodo, MD  Department of Radiology and Biomedical Imaging      XR Chest 1 View (AP Portable)    Result Date: 11/05/2023  XR CHEST 1 VIEW   11/04/2023 9:25 PM    HISTORY: high O2 req    COMPARISON: Chest radiograph 11/03/2023        FINDINGS/IMPRESSION:    Improved lung volumes however with persistent perihilar and bibasilar airspace opacities which may reflect multifocal infection, aspiration, and/or edema. No pleural effusion or pneumothorax.    Unchanged cardiac and mediastinal contours.    Report dictated by: Viviann Spare, MD, signed by: Viviann Spare, MD  Department of Radiology and Biomedical Imaging     TRANSTHORACIC ECHOCARDIOGRAM     Patient Name: KATRINIA STRAKER Postma Patient ZO:[id number]  Date of Report: 11/08/2023      Patient DOB: [birth year]  Referring Phys: [id number] Devere Brem Height: 158.0 cm Weight:142.0 kg  Referring Diag: I26.99          Gender:F BSA:2.32  Indication:PE                   Resting BP:113/103 mmHg  Reading MD JYNWGN:[id number]        Patient Last Name:Sacra  Sonographer: Lewie Chamber        Order [id number]  Exam Location: I                Visit Number: [id number]  Image Quality: Poor             Study Type: TTE  Exam Date:11/08/2023 Exam Time:  1:24:16 PM     Study Location: [id number]        Conclusions:  Due to technical issues, the quality of the study was limited. The patient's blood pressure was 113 mmHg/103 mmHg during the study. The heart rate during the study was 81. Color Flow Doppler was utilized for this exam. Spectral Doppler was utilized for   this exam.  1. The left ventricular volume is normal. LV function is hyperdynamic. 2D LV ejection fraction is estimated to be 70 to 75%. There is no left ventricular hypertrophy. No segmental wall motion abnormalities present.  2. The right ventricular volume is mildly increased. The right ventricle is not well seen but probably normal in function.  3. Left atrial size is normal. The right atrium  is not well seen.  4. There is no hemodynamically significant valvular disease.  5. Diastolic function is indeterminate.  6. The pulmonary artery systolic pressure cannot be determined due to the lack of a complete TR jet.  7. No pericardial effusion noted. The IVC is not well seen and RA pressure cannot be estimated.  8. Aortic root dimension is normal.  9. No evidence of early or late shunting was seen with right sided saline contrast administration.                  Previous Comparison:  No previous study is available for comparison.        Cardiac Chambers:  Left Ventricle: The left ventricular volume is normal. LV function is hyperdynamic. 2D LV ejection fraction is estimated to be 70 to 75%. There is no left ventricular hypertrophy. No segmental wall motion abnormalities present.  LV Diastolic Function: Diastolic function is indeterminate.  Right Ventricle: The right ventricular volume is mildly increased. The right ventricle is not well seen but probably normal in function. RV S' velocity is 12 cm/s.  Left Atrium:Left atrial size is normal.  Right Atrium:The right atrium is not well seen.     Cardiac Valves:  Aortic Valve: The aortic valve was not well visualized. No aortic regurgitation is present.  Mitral  Valve: The mitral valve anatomy and motion are normal. There is mild mitral annular calcification. Trace / physiologic mitral regurgitation is present.  Tricuspid Valve: The tricuspid valve is not well seen. There is trace tricuspid regurgitation.  Pulmonic Valve: The pulmonary valve is not well seen. There is no pulmonic regurgitation.     Other:  Pulmonary Artery and Right Sided Pressures: The pulmonary artery systolic pressure cannot be determined due to the lack of a complete TR jet.  Aorta: Aortic root dimension is normal. The ascending aorta appears normal in size.  Pericardium: No pericardial effusion noted.  IVC and Hepatic Veins: The IVC is not well seen and RA pressure cannot be estimated.  Other: Saline Contrast was used to assess for shunt. No evidence of early or late shunting was seen with right sided saline contrast administration.     Measurements:     Variable (Normal Range)         Value     2D Measures (Normal Range)      Value  LVEDVI, mL/m2                   38 ml/m  LVIDd, cm                       4.6 cm  (35-75 men , 29-61 women )                (4.2-5.8 men, 3.8-5.2 women)  LVESVI, mL/m2                   10 ml/m  LVIDs, cm                       2.4 cm  (11-31 men, 8-24 women)                   (2.5-4.0 men, 2.2-3.5 women)  2D LV Ejection Fraction         74 %      2D Septal thickness, cm         1.2 cm  (55-70%)  3D LV Ejection Fraction  2D Posterior wall dimension     1.2 cm  (55-70%)                                  (cm)  LV Mass Index (TE or A/L),      82 g/m   2D Septal/Post Wall Ratio       1  gm/m2  (50-102, men, 44-88 women)  Aortic Root Size (2D), (2.0-3.7 3.6 cm    ASC AO                          3.1 cm  cm):  LA Volume Index (16-34 ml/m2):  28 ml/m  PV VTI (cm):                    22 cm  LA A/P s, 2D                    4.2 cm    RVOT VTI (14-16 cm):            18 cm                                            LVOT VTI (19-21 cm):            17 cm                                             TAPSE (>1.6 cm):                2.0 cm     (Reference values taken from J Am Soc Echocardiogr 2015;28:1-39)     ___________________________________  Electronically signed on 12/16/2024by  Reading Physician: Patrcia Dolly MD  Procedure Codes:Contrast Enhanced Complete echo-[id number]    Procedures Performed and Complications    Case Time: Procedures: Surgeons:   11/18/23  8:56 AM LAPAROSCOPIC ROUX-EN-Y GASTRIC BYPASS FOR MORBID OBESITY    Alroy Dust, MD   Princella Ion, MD                 Brief Operative Note     Surgeons and Role:     * Alroy Dust, MD - Primary     * Princella Ion, MD - Fellow - Assisting     Date of Operation: 11/18/2023     Pre-Op Diagnosis Codes:      * Severe obesity (CMS code) [E66.01]     Post-Op Diagnosis Codes:     * Severe obesity (CMS code) [E66.01]     Procedure(s) and Anesthesia Type:     * LAPAROSCOPIC ROUX-EN-Y GASTRIC BYPASS FOR MORBID OBESITY - Anes-General     Implants: * No implants in log *      * No specimens in log *     Findings: 130 cm roux limb, 25mm EEA stapled antecolic antegastric gastrojejunostomy, methylene blue test negative, JJ and Petersen's defect closed with permanent suture     Drains: penrose drain in left lower quadrant incision subcutaneous space     Complications: none     Disposition and Plan: stable, PACU to floor  Estimated Blood Loss: 15 mL        Was VTE prophylaxis ordered? Yes           Were NSAIDS ordered? No (other - please comment)  Reason for not ordering: Contraindicated s/p gastric bypass   Notes:           DISCHARGE INSTRUCTIONS    Discharge Diet  Stage II bariatric diet    Functional Assessment at Discharge/Activity Goals  No change in condition or functional status from admission.    Allergies and Medications at Discharge    Allergies: Atorvastatin, Lovastatin, and Lisinopril    Your Medications at the End of This Hospitalization               acetaminophen (TYLENOL) 500 mg tablet  Take 2 tablets (1,000 mg total) by mouth every morning, afternoon, and evening. Two tablets by mouth three times daily CRUSHED.    albuterol 90 mcg/actuation metered dose inhaler Inhale 2 puffs into the lungs every 6 (six) hours as needed for Shortness of Breath    alcohol swabs swab Use as directed    apixaban (ELIQUIS) 5 mg tablet Take 2 tablets (10 mg total) by mouth 2 (two) times daily for 6 days, THEN 1 tablet (5 mg total) 2 (two) times daily.    atorvastatin (LIPITOR) 20 mg tablet Take 1 tablet (20 mg total) by mouth daily    BD INSULIN SYRINGE 1 mL 29 gauge x 1/2" SYRINGE 2 (two) times daily    celebrate calcium citrate + vitamin D3 (CELEBRATE) soft chew Chew 1 tablet by mouth in the morning and 1 tablet in the evening. Chew with meals.    fluticasone propionate (FLONASE) 50 mcg/actuation nasal spray Use 2 sprays in each nostril 2 (two) times daily    gabapentin (NEURONTIN) 300 mg capsule Take 2 capsules (600 mg total) by mouth nightly at bedtime    glucosamine-chondroitin 500-400 mg capsule Starting on 11/16/2023. Take by mouth 2 (two) times daily .  Temporarily stop taking after surgery. Follow up with your surgeon's office before restarting.    insulin glargine (LANTUS, BASAGLAR, SEMGLEE, REZVOGLAR) 100 unit/mL (3 mL) injection pen Inject 8 Units under the skin nightly at bedtime    lactulose (ENULOSE) 10 gram/15 mL solution Take 15 mL (10 g total) by mouth daily for constipation. May increase to 60 mL (40 g total ) once daily if necessary    losartan (COZAAR) 25 mg tablet Starting on 11/16/2023. Take 1 tablet (25 mg total) by mouth daily . Temporarily stop taking this medication after surgery. Follow up with your surgeon's office regarding when to restart.    metoprolol tartrate (LOPRESSOR) 25 mg tablet Take 0.5 tablets (12.5 mg total) by mouth in the morning and 0.5 tablets (12.5 mg total) in the evening. . REPLACE metoprolol succinate (XL formulation) for 3 months.    montelukast (SINGULAIR) 10 mg  tablet Take 1 tablet (10 mg total) by mouth daily    multivitamin-min-iron-FA-vit K (PROCARE BARIATRIC) 45 mg iron- 800 mcg-120 mcg capsule Take 1 capsule by mouth nightly at bedtime    omeprazole (PRILOSEC) 20 mg capsule Take 1 capsule (20 mg total) by mouth daily    ONETOUCH VERIO TEST STRIPS test strip TEST BLOOD SUGAR TWO TIMES A DAY. E11.65    oxyCODONE (ROXICODONE) 5 mg tablet Take 0.5 tablets (2.5 mg total) by mouth every 12 (twelve) hours as needed for Pain    oxymetazoline (AFRIN) 0.05 % nasal spray Use 2 sprays in  each nostril in the morning and 2 sprays in the evening.    pen needle, diabetic 32 gauge x 5/32" needle Use once daily as directed.    Lobbyist: Use as directed to discard insulin needles and lancets with each use.    sodium chloride (OCEAN) 0.65 % nasal spray Use 2 sprays in each nostril every morning, afternoon, and evening.    ursodioL (ACTIGALL) 300 mg capsule Take 1 capsule (300 mg total) by mouth 2 (two) times daily          Immunizations Administered for This Admission       Name Date    Influenza, Split Tri PF  Incomplete        Pending Tests   N/A    Outside Follow-up   Contact information for after-discharge care       Durable Medical Equipment       [facility], an [employer] company - [place] .    Service: [facility] Medical Equipment  Contact information  [address] [place] [address]  [place] [address]  [phone]                     Home Medical Care       [facility] [place] [facility] .    Service: Home Nursing  Contact information  [address]  [place] [address]  [phone]                                   Follow up with PCP in 1-2 weeks    Booked [place] Appointments  Future Appointments   Date Time Provider Department Center   11/19/2023 10:00 AM Festus Barren, PA-C [phone] All Practice       Pending [place] Referrals  [facility] Referrals Made (From  admission, onward)       Ordered     Start    11/09/23 6295  Discharge Referral to Hematology         11/09/23 0000    11/09/23 1523  Discharge Referral to Pulmonology (STOP: New outpatient referrals to Pulmonology MUST be approved by the on-call consult team.  Please call [phone])         11/09/23 0000                    Case Management Services Arranged  Case Management Services Arranged: (all recorded)       Outside Services Arranged For You       Row Name         Durable Medical Equipment    Authorization # --    Start Date for Services --    Additional Instructions Per [employer] rep: Pt received  O2 tanks delivered at bs and fam was contacted for Hm O2 delivery .    RETIRED Name of Agency/Facility --    RETIRED Street address --    RETIRED City --    RETIRED State --    RETIRED Zip Code --    RETIRED Phone number --    RETIRED Fax number --    Fax number --    Name of facility (Retired) --       Home Care    Authorization # --    MD to MD discussion completed --    RN to RN  report phone number --    Start Date for Services --    Additional Instructions A home health representative will contact you to arrange home visits. If you have not heard from the Home Health within 48hrs after your hospital discharge, please contact the Home Health agency.    RETIRED Name of Agency/Facility --    RETIRED Street address --    RETIRED City --    RETIRED State --    RETIRED Patient Home Zip Code --    RETIRED Phone number --    RETIRED Fax number --    Name of facility (Retired) --    Social research officer, government number --                    Discharge Assessment  Condition at discharge:  good  Final Discharge Disposition: Home Health Care (Non [place])          Covid-19 Vaccines       Name Date    Pfizer Covid Vaccine Wallace Cullens Top) 06/08/2021    Manufacturer: Pfizer, [employer].    Lot: [id number]    External: Auto Reconciled From Outside Source    [employer] Vaccine (Purple Top) 10/22/2020    Manufacturer: Pfizer, [employer].    Lot: [id number]    External: Auto  Reconciled From Outside Source    [employer] Vaccine (Purple Top) 10/01/2020    Manufacturer: Pfizer, [employer].    Lot: [id number]    External: Auto Reconciled From Outside Source          Advance Care Planning Documentation during this hospitalization:    Code Status: FULL    Last Orally Designated Health Care Agent (Valid for this hospitalization only)       None          Advance directive, POLST, or Living Will Documents -- Patient Level:    Advance directive, POLST, or Living Will Documents: None found at the patient level.           Advance Care Planning Documentation       Primary Care Physician  Oralia Rud  Address: [address] / [place] [address]   Phone: [phone]  Fax: [phone]     Outside Providers, for pending tests please use the following numbers:   For [facility] - Please Call: [phone]    For [facility] - Please Call: [phone]   For [facility] - Please Call: [phone]    Signed,  Lilly Cove, NP  General Surgery  11/10/23    APP Visit Information:   APP Service Type:  Shared    I spent a total of 45 non-overlapping minutes on discharge related care, coordination, and/or communication.                  Discharge Instructions provided to the patient (if any):    Discharge Instructions    Post-Operative Instructions for Your Care at Home after Bariatric Surgery      The name of your operation is Rou-en-Y gastric bypass. The name of your surgeon is Alroy Dust, MD     If you need urgent help, call [phone]. This number is always answered. However, if you need non-urgent assistance or have questions related to your surgery or condition, please send your surgeon a  MyChart message.    Contact your surgeon if you develop any of the following:   abdominal pain that becomes much worse than when you left the hospital and  does not improve with pain medication and does not improve with pain medication  the area around your incisions becomes very red,  increasingly tender, or begins to drain pus (small amounts of blood-tinged fluid and mild redness are common and are no cause for concern)  your temperature goes above 100.4 F (38 C)  persistent vomiting or diarrhea  you feel as if you are getting more sick    You needed supplemental oxygen and Lasix while in the hospital for Hypoxic respiratory insufficiency and pulmonary edema. You will go home with oxygen and your primary care doctor will wean you off of it. You will have home health RN, PT/OT.    Medications & Pain Control  When you leave the hospital, you will be given prescriptions for several medications, some of which you will only take for a few months and others will be lifelong. These medications must be taken as prescribed and should be started within one to two days of arriving home. Please discuss the medications with your pharmacy before your operation to confirm that these are covered by your insurance or be prepared to pay for them yourself. Resume your usual medicines for chronic medical conditions, being sure to crush the larger solid pills. Please consult a pharmacist regarding which medications, especially larger solid pills, can be crushed.     You will be prescribed a medicine to help with pain control. Over time you will need less and less of the medicine, and you should gradually reduce the amount you take in the weeks after surgery. Do not take any form of non-steroidal anti-inflammatory drugs (NSAIDS) such as ibuprofen (Advil).     Immediately following surgery and for at least the following 6 months, you will be prescribed a medicine that decreases acid production in your stomach to decrease the risk of ulcers.  This medication is usually a proton pump inhibitor (PPI) such as omeprazole (Prilosec) capsules 20mg  daily. Your surgeon may recommend that you continue using an acid reduction medication for the rest of your life.    If you still have a gallbladder, you will be prescribed a  medicine called Ursodiol (or equivalent) to help prevent the formation of gallstones. The dose is 300mg  twice a day.  This should be taken for 6 months after surgery.    If you are at high risk for, or have a history of blood clots in your legs or lungs, you may be given a prescription for a medication to prevent clot formation called Lovenox. If prescribed, this should be taken for 3-6 months after surgery    Vitamins and Minerals are Required Daily After Surgery   ProCare Bariatric Complete Multivitamin (45mg  iron) capsule or chewtab: Take 1 capsule or chewtab by mouth daily at bedtime.   Celebrate Calcium Citrate 500mg  + Vitamin D 500unit softchew: Take 1 softchew by mouth twice daily with breakfast and dinner.   If you do not eat a diet rich in calcium, take one additional softchew with lunch daily.   Note that the ProCare Bariatric Complete Multivitamin is currently available for purchase online at [email or web address] and [email or web address]. Celebrate Calcium Citrate/Vit D softchews are available online at [email or web address] and [email or web address]. If you are unable to obtain our preferred regimen, please refer to our alternate regimen in your dietary information packet.     Activity  It is common to feel a little more tired than usual in the first couple of weeks after surgery. This is to be expected, and you should listen  to your body to determine the appropriate level of activity.  You may return to full activity, including work, when you feel ready. After a laparoscopic operation, this usually occurs within two weeks.  If you have laparoscopic incisions, you may lift as much as you wish and return to more vigorous activities, such as sports as soon as you feel up to it.  You may perform normal activities, such as walking up and down stairs, walking outside the house, doing chores about the house and riding in a car when you feel up to it.  You may drive after one week, as long as you are not taking any pain  medication.    Bowel Movements  You will likely have irregular bowel movements after bariatric surgery, but they will normalize over time.  You will be given lactulose solution at discharge, which should be taken in the first 1-2 weeks after surgery to prevent post-op constipation.  It is especially important to take while on oxycodone (or any narcotic pain medication) to prevent opioid-induced constipation.  Take 15 to 30mL (10 to 20g) by mouth daily; may increase to 60mL (40g) daily if needed for constipation.  HOLD if experiencing loose stools or diarrhea.  Lactulose can be mixed with a zero-calorie drink (Gatorade zero, vitamin zero, etc), water, or milk.   If you have not had a bowel movement 4 days past your surgery despite taking lactulose, we recommend taking a dose of Milk of Magnesia. This will help for acute constipation by speeding up bowel motility.  To prevent constipation, we recommend adding powder fiber (Benefiber or Metamucil) with increased fluid intake or smooth move tea.      Wound Care  You may take showers after your operation. Let the soap and water run over your incisions. If you had a gastric bypass, expect some drainage where the drain was removed.  This is normal and will resolve after a few days.    Pregnancy and Contraception  We recommend you do not become pregnant for at least 12 months after bariatric surgery. In general, oral contraception ("birth control pills") can be resumed a month after surgery. The reason we recommend waiting a month before taking your birth control pills is because 1) birth control pills can increase your risk of developing a blood clot, and 2) medications given during your surgery can affect how well the birth control works.  If you are sexually active, we recommend an IUD or abstinence for the first month after surgery. After that, an IUD or birth control pills are recommended for the first year.  Finally, we do not recommend condoms, cervical cap, and  other barrier techniques, as they are not reliable enough to use in the first year after bariatric surgery to use for contraception.    Approach to Diet After Your Operation  The diet guidelines are designed to limit calories while providing a balanced meal plan to help prevent nutrient deficiencies and preserve muscle tissue. Tolerance and progression will vary from patient to patient. Some general guidelines are:   Eat slowly and chew small bites of food thoroughly.  When you first start taking solids, avoid rice, bread, raw vegetables, and meats that are not easily chewed such as pork and steak. Ground meats are usually better tolerated.  Eat balanced meals with small portions.  Keep a daily record of your food portions, plus calories and protein intake.  Concentrate on following a diet low in calories and sweets.  Avoid sugar, sugar-containing  foods and beverages, concentrated sweets, and fruit juices.  Preserve muscle tissue by eating foods rich in protein. High protein foods are eggs, meats, fish, seafood, tuna, poultry, soy milk, tofu, cottage cheese, yogurt, and other milk products. Your goal should be a minimum of 60-80 grams of protein per day. Do not worry if you cannot reach this goal in the first couple of months after your operation.    Fluids  Drink extra water and low calorie or calorie-free fluids between meals to avoid dehydration. Sip about one cup of fluid between each small meal, 6 to 8 times a day. At least two liters (64 ounces or 8 cups) of fluid a day is recommended. You will gradually be able to meet this target.  We strongly warn against the use of any alcoholic beverages. Alcohol will get absorbed into your system much more quickly than before, and the sedative and mood-altering affects are more difficult to predict and control.    Diet Progression After Bariatric Surgery  Please reference our "Diet after Bariatric Surgery" education guide for detailed directions regarding diet  advancement after surgery.    Follow-up Contacts  Your first postoperative appointment should be scheduled within 1-3 weeks of leaving the hospital. If you do not have an appointment scheduled, send a message through MyChart to your surgeon's office. You can also call [phone]  It is recommended you meet regularly with a dietitian and join a support group in your community to help with your lifestyle adjustments.     Post Operative Follow-up Appointments & Labs  You will be seen by a member of our bariatric team via video visit.  If you wish to have a follow-up appointment with the dietitian, please let us know so the visit can be scheduled accordingly:  1-3 weeks after discharge  3 months post-op  6 months post-op  12 months post-op  every year thereafter    Monitoring lab values is important to avoid complications of malnutrition. Labs should start with the 3 months visit and be drawn about 2 weeks before your appointment so the results can be discussed with your surgeon.  If you are getting your labs drawn at your local hospital, bring the printed results with you to your appointment.  These labs are recommended:  Complete blood count  Complete metabolic panel  Lipid Panel  Iron panel  Folate, thiamine, copper, iron, ferritin, selenium, zinc, vitamin A, D, B12  Hemoglobin A1C  PTH          Patient Instructions    None

## 2023-11-10 NOTE — Interdisciplinary (Addendum)
 CASE MANAGEMENT DISCHARGE NOTE     FINAL DISCHARGE NOTE    CASE MANAGEMENT DISCHARGE (most recent)       Discharge Note Flowsheet - 10/13/23          Final Discharge Note    *Primary Case Manager (First and Last Name) Heidi Dach     Final Discharge Disposition Home Health Care (Non [place])     Discharge Planning Needs Complete? Yes     Skilled or Acute needs DME;Physical Therapy     Discharge DME Oxygen     Patient Choice Choice of providers discussed with patient and/or designee.  Patient, family or legal decision maker, and team are in agreement with this discharge plan     CD images provided for treatment purposes? No     Patient/Parent/Surrogate Decision Maker agrees with the plan Yes        Durable Medical Equipment    Additional Instructions Per [employer] rep: Pt received  O2 tanks delivered at bs and fam was contacted for Hm O2 delivery .        Home Care    Additional Instructions A home health representative will contact you to arrange home visits. If you have not heard from the Home Health within 48hrs after your hospital discharge, please contact the Home Health agency.        Transportation Arrangements    Date of Transport 11/10/23     Final transportation arrangements Self/Family/Caregiver (no assistance needed)                   CONTINUED CARE and SERVICES COORDINATION  Durable Medical Equipment - Admitted Since 11/02/2023       Service Provider Services Address Phone Fax    [facility], an [employer] company - [facility] [address] [place], [place] [address] [phone]          Home Medical Care - Admitted Since 11/02/2023       Service Provider Services Address Phone Fax    [facility] [place] [facility] Sk[facility] [address], [place] [address] [phone]            NOTES    Pt was not dc from hosp yesterday . EDD: today.   DME: Team added Ad[employer] connector for  oxygen to fit CPAP and humidifier.     Update order send to [employer] for processing . Per Barbara Cower, O2 sats pending for ins coverage.   Update: Updated note uploaded to AS. Barbara Cower with [employer] informed via AS.  Update : O2 sats with improvement during the day pt will need nocturnal O2. Note pending.     HH informed of dc for today.    Heidi Dach RN   Case Mgr for Gen Surg ( [facility], [facility]), Plastics, OMFS, and 11 ICU primary teams.

## 2023-11-10 NOTE — Consults (Signed)
 Follow up Respiratory Care Consult for Airway Clearance Technique/Lung Expansion Technique Pathway    Identified Pathway: LET Pathway  ACT Pathway:    LET Pathway: Oscillating PEP    Frequency: Aerobika BID and PRN      Primary pulmonary status/diagnosis: Suspected or impending atelectasis (LET Pathway)  Contraindications: NO: Evaluate for frequency of technique(s)  Chest X-ray: Clear or not available  Right breath sounds: Clear, Diminished  Left breath sounds: Clear, Diminished  Respirations: 18  Respiratory pattern: Regular, Unlabored  Dyspnea Assessment (Revised Borg): Nothing at all  Cough: None  SpO2: 93 %  Level of consciousness: Alert, Awake  Level of activity: Chair  Smoking history: Never smoked  Surgical history (Present Admission): Lower abdominal  Total points: 6        Jiro Kiester, RCP  Time spent: 15

## 2023-11-10 NOTE — Nursing Note (Signed)
 SpO2 on room air at rest is 99%  SpO2 with ambulation on RA is 93% with 2L NC improves to 99%.

## 2023-11-10 NOTE — Consults (Signed)
 [facility]  History and Physical/ Consult Note    Chief Complaint: Susan Goodwin is a 62 y.o. 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 Aundria Rud. Now with persistent morning epistaxis.     Requesting Provider: Alroy Dust, MD  Requesting Service: General Surgery    Reason for Consult: Epistaxis     History of Present Illness  Susan Goodwin is a 62 y.o. woman with a history of gastric bypass c/p respiratory failure and PE (on Eliquis), who presents after coughing up small blood clots every morning. No history of nasal trauma.      Anticoagulation: Eliquis  Supplemental oxygen: NC     Side: Both sides, predominantly PND  Onset: 6 days  Frequency: daily  Blood comes: down the back of the throat  Amount of blood lost during each episode:  Coughs up a clot, no fresh blood  How episodes usually resolve:  On there own  Current regimen: no nasal moisturization regimen  Has previously had packing placed: No  Has required a transfusion: No  On blood thinners: Yes: Eliquis  Prior facial trauma: No  Prior sinonasal surgery: No  No previous epistaxis episodes    Review of Systems:  As per HPI. All other systems were reviewed and are non-contributory.    --- For other relevant health data, including Allergies, Home medications, and Health history, please see addendum of note ---    Objective:  BP 128/51 (BP Location: Left upper arm, Patient Position: Lying)   Pulse 81   Temp 36.6 C (97.9 F) (Oral)   Resp 20   Ht 157.5 cm (5\' 2" )   Wt (!) 134.8 kg (297 lb 1.6 oz)   SpO2 99%   BMI 54.34 kg/m       Intake/Output Summary (Last 24 hours) at 11/10/2023 1543  Last data filed at 11/10/2023 1322  Gross per 24 hour   Intake 1200 ml   Output 1250 ml   Net -50 ml         General: Well developed, well nourished. NAD, conversing appropriately. NC in place  Neurological:  Alert and  oriented to date, time, place, and situation.   HEENT:       Head: normocephalic atraumatic      Face: no worrisome skin lesions, no sinus tenderness       Eyes: EOMI      Ears: pinnae normal      Nose: nares clear, no masses, pink mucosa, minimal right sided  crusting on anterior rhinoscopy      Oral cavity: good dentition, moist mucosa, oral tongue soft without lesions, good movement anterior tongue      Oropharynx: no mucosal lesions palpated at base of tongue/vallecula, no bleeding      Neck: supple, flat, trachea midline, no crepitus, no hematoma, symmetric submandibular and parotid glands, no cervical or supraclavicular lymphadenopathy      Neuro: Sensation to light touch intact bilaterally in distribution of V1-V3, CN VII intact bilaterally(1/6 House-Brackmann scale), gag reflex intact, palate elevates symmetrically, tongue protrudes midline, shoulder shrug strong      Voice: at baseline    Selected Results:  I have reviewed the following laboratory data --        11/09/23  0654 11/08/23  0253   WBC 4.6 6.0   HCT 40.2 39.7   PLT 138* 126*  11/09/23  0654 11/08/23  1953 11/08/23  1953 11/08/23  0253 11/07/23  2148   NA 140  --  138  --   --    K 3.6  --  3.6  --   --    CL 102  --  101  --   --    CO2 27   < > 28  --   --    CREAT 0.72  --  0.67  --   --    GLU 169   < > 192  --   --    MG 2.2  --  2.1  --   --    PO4 4.6  --  3.7  --   --    PTT  --   --   --  59.8* 59.8*    < > = values in this interval not displayed.         Lab Results   Component Value Date    WBC Count 4.6 11/09/2023    Hemoglobin 13.3 11/09/2023    Hematocrit 40.2 11/09/2023    MCV 93 11/09/2023    Platelet Count 138 (L) 11/09/2023     Lab Results   Component Value Date    NA 140 11/09/2023    K 3.6 11/09/2023    CL 102 11/09/2023    CO2 27 11/09/2023    BUN 15 11/09/2023    CREAT 0.72 11/09/2023    GLU 169 11/09/2023     Lab Results   Component Value Date    Calcium, total, Serum / Plasma 9.4 11/09/2023    Phosphorus,  serum/plasma 4.6 11/09/2023     Lab Results   Component Value Date    Magnesium, Serum / Plasma 2.2 11/09/2023     Lab Results   Component Value Date    Alanine transaminase 25 09/22/2023    AST 23 09/22/2023    Alkaline Phosphatase 48 09/22/2023    Bilirubin, Total 0.3 09/22/2023     Lab Results   Component Value Date    INR 1.1 11/07/2023    PT 13.8 11/07/2023     Lab Results   Component Value Date    aPTT 59.8 (H) 11/08/2023     Lab Results   Component Value Date    Troponin I <0.02 11/07/2023     No results found for: "POCTLEUK", "POCTNITRITE", "POCTPROTEIN", "POCTPHUR", "POCTRBCUR", "POCTSPECGRAV", "POCTKETONESU", "POCTBILIUR", "POCTGLUCUR"      Review of Other Relevant Data:    Microbiology:  Microbiology results - 7 Days   Microbiology Results   Date Collected Specimen Source Result       Pathology:  Specimens (From admission, onward)      None            Imaging:  Radiology Results  No results found.      Procedures Performed:  None      Assessment and Plan:  Susan Goodwin ([id number]) - 1312/1312-1  Susan Goodwin is a 62 y.o. woman with a history of gastric bypass c/b respiratory failure and PE (on eliquis)requiring supplemental oxygen who presents with nightly epistaxis that manifests as coughing up clots every morning.    No active bleeding on exam but not currently using any humidification medications.     Epistaxis prevention  - Nasal saline irrigation with Ocean 0.65% saline spray, 5 sprays each nare, TID  - Vaseline to bilateral nasal vestibules   - Face tent providing humidified room air   -  Consider holding anticoagulation/antiplatelet agents if clinically appropriate     Epistaxis treatment  - Can d/c pt with Afrin to use as needed for epistaxis    Plan discussed with [facility] attending physician Dr. Gus Height. Recommendations communicated to primary team.    --- Please page/call the Otolaryngology - Head & Neck Surgery Consult Pager at [phone] with any questions ---    Nolberto Hanlon,  MD  Resident, Otolaryngology - Head and Neck Surgery  11/10/23      Additional Medical History    Medications Prior to Admission:  Scheduled Meds:   0.9% sodium chloride flush  3 mL Intravenous Q12H [facility]    acetaminophen  1,000 mg Oral Q6H    apixaban  10 mg Oral BID SCH    Followed by    Melene Muller ON 11/15/2023] apixaban  5 mg Oral BID SCH    bisacodyL  10 mg Rectal Once    fluticasone propionate  1 spray Each Nostril Daily SCH    gabapentin  600 mg Oral Daily At Bedtime [facility]    insulin aspart U-100  0-60 Units Subcutaneous TID [facility] SCH    insulin aspart U-100  0-9 Units Subcutaneous Daily At Bedtime [facility]    ipratropium-albuterol  3 mL Nebulization BID    lansoprazole  30 mg Oral Q AM Before Breakfast SCH    lidocaine  2 patch Topical Daily SCH    metoprolol tartrate  12.5 mg Oral BID SCH    ondansetron  4 mg Intravenous Q6H    potassium chloride  40 mEq Oral Once    senna  17.2 mg Oral Daily At Bedtime [facility]    senna  8.6 mg Oral Once     Continuous Infusions:  PRN Meds:   sodium chloride  1 spray Each Nostril Q6H PRN    0.9% sodium chloride flush  3 mL Intravenous PRN    albuterol  2 puff Inhalation Q6H PRN    bisacodyL  10 mg Rectal Daily PRN    dextrose 50%  12.5 g Intravenous Q15 Min PRN    Or    glucose  16 g Oral Q15 Min PRN    Or    juice (for hypoglycemia)  16 g Oral Q15 Min PRN    insulin aspart U-100  0-9 Units Subcutaneous Early 2 AM PRN    oxyCODONE  2.5-5 mg Oral Q6H PRN    polyethylene glycol  17 g Oral Daily PRN    prochlorperazine  5-10 mg Intravenous Q6H PRN       Allergies:  Allergies/Contraindications   Allergen Reactions    Atorvastatin      myalgia    Lovastatin      diarrhea    Lisinopril      bad cough   bad cough    bad cough       Past Medical History:  Past Medical History:   Diagnosis Date    Acid reflux     Asthma     Diabetes mellitus (CMS code)     Essential hypertension     High cholesterol     History of esophagogastroduodenoscopy (EGD)     Knee pain     Low back pain     Obesity     Sleep  apnea     Uterine cancer (CMS code)        Past Surgical History:  Past Surgical History:   Procedure Laterality Date    ANKLE FRACTURE SURGERY  CARPAL TUNNEL RELEASE      FINGER SURGERY      screw placement    HYSTERECTOMY      NASAL FRACTURE SURGERY      SHOULDER SURGERY      TUBAL LIGATION         Social History:  Social History     Socioeconomic History    Marital status: Married     Spouse name: Not on file    Number of children: Not on file    Years of education: Not on file    Highest education level: Not on file   Occupational History    Not on file   Tobacco Use    Smoking status: Never    Smokeless tobacco: Never   Substance and Sexual Activity    Alcohol use: Not Currently     Comment: past rare social use; no history of problem use    Drug use: Never     Comment: no history of Rx med abuse or illicit substance use    Sexual activity: Not Currently   Other Topics Concern    Not on file   Social History Narrative    Not on file     Social Drivers of Health     Financial Resource Strain: Medium Risk (04/13/2023)    Overall Financial Resource Strain (CARDIA)     Difficulty of Paying Living Expenses: Somewhat hard   Food Insecurity: Food Insecurity Present (04/13/2023)    Hunger Vital Sign     Worried About Running Out of Food in the Last Year: Often true     Ran Out of Food in the Last Year: Sometimes true   Transportation Needs: No Transportation Needs (04/13/2023)    PRAPARE - Therapist, art (Medical): No     Lack of Transportation (Non-Medical): No   Housing Stability: High Risk (04/13/2023)    Housing Stability Vital Sign     Unable to Pay for Housing in the Last Year: Yes     Number of Times Moved in the Last Year: Not on file     Homeless in the Last Year: Not on file      Social history was reviewed and is non-contributory to this illness.    Family History:  Family History   Problem Relation Name Age of Onset    Mental health problem Mother          in [facility]       Family history was reviewed and is non-contributory to this illness.

## 2023-11-10 NOTE — Plan of Care (Signed)
 Problem: Discharge Planning - Adult  Goal: CM / SW Coordination of safe discharge plan  Outcome: Adequate for Discharge

## 2023-11-12 NOTE — Telephone Encounter (Addendum)
 Pharmacy team follow-up on behalf of Care Transitions Outreach Program (CTOP) in regards to RN escalation from [name] indicating that patient was not able to pick up her Lantus prescription. Spoke with patient.    #Lantus - Ready for pick up  Per CTOP RNs note from [name] this morning, patient stated that she has all of her diabetes medication supplies (needles for insulin pen, lancets, test strips, glucometer) but was unable to pick up her Lantus prescription.    Per pts medication list, a Lantus prescription (Lantus 100 units/mL Pen - Inject 8 units subcutaneously nightly at bedtime) was sent to [facility] [phone]) on 11/10/2023. Writer reached out to [facility] in [place] and spoke to a pharmacy staff who confirmed that pts Lantus prescription has been filled and is ready for pick up. Writer called patient to relay this information and pt verbalized understanding. Pt confirmed that she will pick up her Lantus prescription today. Pt denied any additional questions, comments, or concerns.     [place] Chin, Pharmacy Student    I have reviewed and concur with the information above.     Criss Rosales, PharmD   [facility] PGY1 Community Resident   [address] [place], [place] [address]

## 2023-11-12 NOTE — Telephone Encounter (Addendum)
 Provider Notification: patient does not have Lantus - I've escalated to [place] pharmD to assist    Spoke with the patient to follow up on responses to Care Transitions Outreach automated telephone call/SMS.    Patient states "I'm okay." Reports pain is controlled tylenol and oxycodone. Patient denies fever, chills, redness/swelling/drainage/warmth/increased pain at incision sites. Advised to notify clinic and send a photo via MyChart if any of the previously listed symptoms occur.     Patient expressed concern for missing medications. RN preformed med review with patient and identified that she dose not have Lantus or multivitamin. Multivitamin was ordered on [place] and will arrive soon. She has not taken Lantus for the past two days, reports current BG:180. She reports having glucometer, test strips, lancets and needles for insulin pen. Notified patient/caregiver that med issue will be escalated to [facility] Pharmacist for further assistance and confirmed okay for [facility] Pharmacist to leave a detailed voicemail at phone # on file if patient/caregiver unavailable.    Patient reports constipation, LBM yesterday was "hard". Reviewed AVS instructions for constipation. Patient will take lactulose today, reviewed to take daily while taking oxycodone.    Patient reports O2 saturation is "96%" on room air, wearing O2 while sleeping. [facility] has been visiting.    Reviewed follow up appointments.    All appropriate follow up/return precautions reviewed including when to call the clinic and when to present to ER. Also reviewed how to contact provider on-call in case urgent issues arise outside of regular business hours/weekends.    RN identified that patient/caregiver is active in MyChart and encouraged MyChart use to communicate non-urgent needs with provider team/clinic.     Patient denies any other questions, concerns, or needs at this time.    [name], RN, Outreach Nurse  [place] [facility]  [phone]  This RN phone  call was performed as a service by [employer], which calls/texts patients within 2 business days of hospital discharge in order to address clinical issues including symptom management, prescription/med issues, follow-up planning, and questions about discharge instructions. The Discharge Summary and After Visit Summary are used to assess, advise, and educate patients/families. When the RN is unable to directly resolve issues identified on the call, the program will assist the patient by identifying the providers who can.

## 2023-11-12 NOTE — Telephone Encounter (Signed)
 Called her.  Her husband filled the insulin prescription today and she should be starting tonight.  Discussed goal of fasting AM BG of 70-100.  If much more elevated then that could consider increasing insulin dose.  Discussed ok to start multivitamin when it arrives in the mail, no worries that she isn't taking right now.  She will keep up with the lactulose to soften BMs.  Recommend getting topical lidocaine patches to help with EEA port site pain.  She is tolerating POs without issues.  I'll see her on 12/27 for a post-op.     Dixon Boos, PA-C  [facility]   [phone]

## 2023-11-19 ENCOUNTER — Telehealth: Admit: 2023-11-20 | Payer: Commercial Managed Care - HMO | Attending: Physician Assistant

## 2023-11-19 DIAGNOSIS — Z9884 Bariatric surgery status: Secondary | ICD-10-CM

## 2023-11-19 MED ORDER — GABAPENTIN 300 MG CAPSULE
300 mg | ORAL_CAPSULE | Freq: Every day | ORAL | 0 refills | Status: DC
Start: 2023-11-19 — End: 2024-02-09

## 2023-11-19 NOTE — Progress Notes (Signed)
 HPI  I saw Susan Goodwin today in the [facility]. 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     She is doing well on a primarily liquid diet without significant nausea, vomiting, or dysphagia. She reports no symptoms of dehydration.  She has had no clinical symptoms of leak, stricture, intestinal obstruction or marginal ulceration.     The patient has not had any readmissions, reoperations, or interventions related to bariatric surgery since discharge from the hospital.  She was found to have pulmonary emboli post-operatively and was started on eliquis.  Discharge referrals were placed to hematology and pulmonology.  She has not been able to schedule these visits yet.    She has been taking 8 U basal insulin at night.  Her fasting blood glucose levels have been 167.  Not taking metformin or actos.  She is not using her CPAP nightly because it is too uncomfortable. She notes the pressures are too high. She has established care with a cardiologist, but not a pulmonologist.      She took 1 of 3 oxycodone prescribed.  Post-op pain at its worst was 3 out of 10.  Wearing her abdominal binder.  Tolerating POs (2 protein shakes, 1 sugar free jello, broth).  Having hard BMs; taking lactulose prn.  She had been taking metoprolol 25 mg twice daily, rather than 12.5mg  twice daily.  Her pulse oxygen readings have been >94.      Heaviest weight in the past year: 318 lbs  Pre-liquid diet weight: 313 lbs  Day of surgery weight: 297 lbs    Medications and Vitamins  I reviewed and changed her medications and vitamins supplements today as needed, which are:  Current Medications         Dosage    acetaminophen (TYLENOL) 500 mg tablet Take 2 tablets (1,000 mg total) by mouth every morning, afternoon, and evening. Two tablets by mouth three times daily CRUSHED.    albuterol 90 mcg/actuation metered dose inhaler Inhale 2 puffs into the lungs every 6  (six) hours as needed for Shortness of Breath    alcohol swabs swab Use as directed    apixaban (ELIQUIS) 5 mg tablet Take 2 tablets (10 mg total) by mouth 2 (two) times daily for 6 days, THEN 1 tablet (5 mg total) 2 (two) times daily.    atorvastatin (LIPITOR) 20 mg tablet Take 1 tablet (20 mg total) by mouth daily    BD INSULIN SYRINGE 1 mL 29 gauge x 1/2" SYRINGE 2 (two) times daily    celebrate calcium citrate + vitamin D3 (CELEBRATE) soft chew Chew 1 tablet by mouth in the morning and 1 tablet in the evening. Chew with meals.    fluticasone propionate (FLONASE) 50 mcg/actuation nasal spray Use 2 sprays in each nostril 2 (two) times daily    gabapentin (NEURONTIN) 300 mg capsule Take 2 capsules (600 mg total) by mouth nightly at bedtime    glucosamine-chondroitin 500-400 mg capsule Take by mouth 2 (two) times daily .  Temporarily stop taking after surgery. Follow up with your surgeon's office before restarting.    insulin glargine (LANTUS, BASAGLAR, SEMGLEE, REZVOGLAR) 100 unit/mL (3 mL) injection pen Inject 8 Units under the skin nightly at bedtime    lactulose (ENULOSE) 10 gram/15 mL solution Take 15 mL (10 g total) by mouth daily for constipation. May increase to 60 mL (40 g total ) once daily if necessary  losartan (COZAAR) 25 mg tablet Take 1 tablet (25 mg total) by mouth daily . Temporarily stop taking this medication after surgery. Follow up with your surgeon's office regarding when to restart.    metoprolol tartrate (LOPRESSOR) 25 mg tablet Take 0.5 tablets (12.5 mg total) by mouth in the morning and 0.5 tablets (12.5 mg total) in the evening. . REPLACE metoprolol succinate (XL formulation) for 3 months.    montelukast (SINGULAIR) 10 mg tablet Take 1 tablet (10 mg total) by mouth daily    multivitamin-min-iron-FA-vit K (PROCARE BARIATRIC) 45 mg iron- 800 mcg-120 mcg capsule Take 1 capsule by mouth nightly at bedtime    omeprazole (PRILOSEC) 20 mg capsule Take 1 capsule (20 mg total) by mouth daily     ONETOUCH VERIO TEST STRIPS test strip TEST BLOOD SUGAR TWO TIMES A DAY. E11.65    oxyCODONE (ROXICODONE) 5 mg tablet Take 0.5 tablets (2.5 mg total) by mouth every 12 (twelve) hours as needed for Pain    oxymetazoline (AFRIN) 0.05 % nasal spray Use 2 sprays in each nostril in the morning and 2 sprays in the evening.    pen needle, diabetic 32 gauge x 5/32" needle Use once daily as directed.    Lobbyist: Use as directed to discard insulin needles and lancets with each use.    sodium chloride (OCEAN) 0.65 % nasal spray Use 2 sprays in each nostril every morning, afternoon, and evening.    ursodioL (ACTIGALL) 300 mg capsule Take 1 capsule (300 mg total) by mouth 2 (two) times daily          Physical Exam   Vitals: There were no vitals taken for this visit.  There is no height or weight on file to calculate BMI.  Constitutional: She is oriented to person, place, and time. She appears well-developed and well-nourished.  Abdominal: Laparoscopic incisions are clean, dry, intact, without hematoma, seroma, or soft tissue infection.    Assessment and Plan  This is a 62 y.o. female doing well after bariatric surgery.  I would like to see the patient again in 4-6 weeks.  Will plan for labs at [facility] once she is 3 months out from surgery, orders placed.  I will keep you apprised of her progress.    She will be adding solid food to the diet over the coming weeks.  I reviewed with the patient their caloric goals, protein goals, and need to minimize carbohydrates (especially simple sugars and high glycemic index foods).  Discussed swapping jello for a more protein dense option.      Discussed plan to remain on eliquis for the next 3 months then will reassess w/hematology.  She will also schedule a visit with hematology.    Will restart losartan and reduce metoprolol to 12.5mg  twice daily.  She will continue to track her BP readings at home.      Will also increase basal insulin to 10 U at bedtime.  Discussed  goal fasting blood glucose of 70-100 when she wakes up.  She will keep me posted about her blood glucose readings as she may need to add a qam insulin dose as well.  Will restart metformin, but continue to hold actos.      Discussed the need to follow-up with a pulmonologist.  Will follow-up with [place] pulmonology about discharge referral.      Will extend bedtime gabapentin for another 2 weeks to help with incisional pain at [facility] port site.  General care after bariatric surgery:  Daily Water Requirements  Daily water goals for includes 64 oz minimum water intake (1-2 Liters) per day.     Daily Multivitamins  It is very important for patients to take a daily high potency multi vitamin for life. We recommend Bariatric Procare Multivitamin with 45mg  of iron (chewable or capsule) once daily (capsules can be swallowed whole). This is available only online without need of a prescription at [email or web address]     Daily Calcium Supplementation  Patients should take 1200-1500 mg daily of calcium citrate for life. It is best to take calcium supplementation in small doses throughout the day for best absorption. There are many forms available including: tablet, chewable, soft chew, gummies etc. It is recommended that patients find a calcium citrate brand that they enjoy, that is calorie free or very low calorie. These are available at any pharmacy or online without the need of a prescription. We recommend trying the Celebrate brand Calcium citrate soft chews available at [email or web address].[email or web address]     Daily Protein Requirements  Goal should be protein intake between 60-80 grams per day. Some options include protein shakes or powders, lean meats ([place], fish, chicken), tofu, etc. In addition patients need green vegetable intake avoid sugars, carbohydrates, starches, pasta, rice, bread, and potatoes.     Calorie Goals - Weight Loss Phase  Daily Calorie Goal of 500-700 total per day, focusing on lean proteins and leafy green vegetables  during the weight loss phase, until patient have reached their desired goal weight.     Calorie Goals - Weight Maintenance Phase  Daily calorie goal of around 1000 per day (+/- 100-200 calories) once patients have achieved their weight loss goal. Pateints should continue to count and log calories for life even when at goal weight to maintain desired weight. Patients may need to adjust calories up or down to maintain their weight at goal over time.    Tips for patients:  1. Identify a set time and place to count calories - make this process a daily habit.   2. Using calorie counting aids (web-based: www.fitday.com) can help.   3. Use a personal food space at home to help concentrate on food types that others in the household may not be eating.  4. Advance meal planning using a daily / weekly spreadsheet.   5. Time your meals to less than 10 minutes per meal (eg. use an egg timer if necessary)  6. Attendance at a support group is highly encouraged. The [place] Support Group meets the 3rd Wednesday of every month at 6pm - the location for this meeting may change depending on availability, and will be identified online at our website or by email. Patients should feel free to join this group any time and bring friends or family members. To add an email to the support group mailing list, send request to: [email or web address].      For more information, please visit our website: [email or web address]     Medications  After gastric bypass, patients should never take NSAIDS because of the risk of marginal ulceration.  After sleeve gastrectomy, NSAIDS can be taken after healing has occurred, typically 3-6 months after surgery.     Smoking cigarettes should never occur after bariatric surgery.     Minimal alcohol intake is acceptable 3 months after surgery, but any alcohol (wine or beer) intake should be limited to 2oz. / 8 hours, as the effect of alcohol will be more pronounced. Gastric  bypass patients  are at increased risk for alcoholism.    Documentation of Visit Statement  I performed this consultation using real-time Telehealth tools, including a live video connection between my location and the patient's location. Prior to initiating the consultation, I obtained informed verbal consent to perform this consultation using Telehealth tools and answered all the questions about the Telehealth interaction.     APP Visit Information:     APP Service Type:  Independent  Available MD consultant:  Alroy Dust, MD         Dixon Boos, PA-C  [facility]   [phone]

## 2023-11-19 NOTE — Telephone Encounter (Unsigned)
 B-STOP  Pre-operative, intra-operative, postoperative, and discharge information    Opioid Medications Given <=10 days pre-op:  No Instructions for Post-op Pain Management Given: Yes   TAP Block Administered: Yes Post-op Pain Score: 10   Morphine (MSO4) Equivalent (Inpatient): 33.75 mg Morphine (MSO4) Equivalent (Day of Discharge): 0 mg     Use of Multimodal Pain Management    Pre-op Yes, 2 medications (IV tylenol and oral gabapentin)   Post-op Yes, >1 medication (tylenol, gabapentin, dcelebrex, topical lidocaine patch, abdominal binder, opioid prn)   Discharge Yes, >1 medication (tylenol, gabapentin, abdominal binder, opioid prn)     Opioids at Discharge    Opioids Prescribed at Discharge: Yes   Opioid Prescribed and Strength at Discharge: Oxycodone 5 mg   Total Opioid Quantity Prescribed: 3   Morphine (MSO4) Equivalent (Discharge):  22.5   Adherence to Discharge Opioid Prescribing Protocol:  Yes     Outpatient Information    Total Opioids used (Outpatient):  *** Morphine (MSO4) Equivalent (Outpatient): *** mg   Outpatient Pain Score: *** (0-10) Unused Opioids returned: N/A   Additional Opioids prescribed: No 30-day post-operative opioid use: No

## 2023-11-25 NOTE — Addendum Note (Signed)
Addendum  created 11/25/23 1232 by Aileen Fass, MD    Intraprocedure Event edited, Intraprocedure Staff edited

## 2023-12-22 ENCOUNTER — Ambulatory Visit: Payer: Commercial Managed Care - HMO | Attending: Physician Assistant

## 2023-12-22 DIAGNOSIS — E1165 Type 2 diabetes mellitus with hyperglycemia: Secondary | ICD-10-CM

## 2023-12-22 NOTE — Progress Notes (Signed)
I performed this consultation using real-time Telehealth tools, including a live video connection between my location and the patient's location. Prior to initiating the consultation, I obtained informed verbal consent to perform this consultation using Telehealth tools and answered all the questions about the Telehealth interaction.    HPI  Susan Goodwin is a 63 y.o. year old female who [facility] [place] [place] for follow-up.    She is scheduled to see hematology in a few weeks. She continue to take eliquis as directed.      She notes her pulse ox readings have normalized. She hasn't been able to schedule a visit with pulmonology yet.      Fasting blood glucose level 160 in the AM.  At night before bed it was 150.  She is using 12 units long acting insulin at bedtime.      She notes nausea with taking lactulose.  Still having intermittent episodes of constipation and would like to discuss other treatment options.      She was on ozempic in the past, but weight loss hit a plateau so she stopped.     PE  Vitals: Ht 157.5 cm (5\' 2" )   Wt (!) 126.6 kg (279 lb)   BMI 51.03 kg/m   Constitutional: oriented to person, place, and time. Appears well-nourished. No distress.     A/P  1. Uncontrolled type 2 diabetes mellitus with hyperglycemia (CMS code)  -She interestingly enough did not require insulin while on the 2 week liquid diet.  Does not sound like there is a clear contributor to her elevated FBG.  Will adjust insulin to be 8 units twice daily; she has been on twice daily dosing in the past.  She will hold the ursodiol for 1 week to see if that helps improve blood glucose levels.  She will keep me updated via mychart.      2. S/P gastric bypass  -Continue with multivitamin and calcium + vitamin D supplement daily.      3. Morbid (severe) obesity due to excess calories (CMS code)  -She will continue with a high protein and fiber diet.    -Could  consider restarting a GLP-1a once she is 3 mo out from bariatric surgery to help augment further weight loss.  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      4. Obstructive sleep apnea syndrome  -Will follow-up on pulmonology referral.    5. Hyperlipidemia, unspecified hyperlipidemia type  -Recommend she continue with lipitor daily.  Will monitor her lipid panel and she might be able to discontinue in the future.     6. Acute pulmonary embolism, unspecified pulmonary embolism type, unspecified whether acute cor pulmonale present (CMS code)  -Appreciate hematology's recs for eliquis.    7. Constipation, unspecified constipation type  -Recommend switching from lactulose to miralax to help keep BMs normal.  She will pick this up over the counter.  Recommend she titrate the dose and frequency based on stool consistency.      Follow-up in 6 weeks     I, Festus Barren, PA-C, spent a total of 43 minutes on this patient's care on the day of their visit excluding time spent related to any billed procedures. This time includes time spent with the patient as well as time spent documenting in the medical record, reviewing patient's records and tests,  obtaining history, placing orders, communicating with other healthcare professionals, counseling the patient, family, or caregiver, and/or care coordination for the diagnoses above.    APP Visit Information:   APP Service Type:  Independent  Available MD consultant:  Alroy Dust, MD         Dixon Boos, PA-C  [facility]: Bariatrics   [phone]

## 2024-01-11 ENCOUNTER — Telehealth
Admit: 2024-01-11 | Discharge: 2024-01-11 | Payer: Commercial Managed Care - HMO | Attending: Student in an Organized Health Care Education/Training Program

## 2024-01-11 DIAGNOSIS — E662 Morbid (severe) obesity with alveolar hypoventilation: Secondary | ICD-10-CM

## 2024-01-11 NOTE — Progress Notes (Unsigned)
[facility]   Today 01/11/2024 I evaluated [name] (DOB [birth year]  MRN [id number]) referred by Lilly Cove, NP for ***. I reviewed her medical data and summarize them here.       History of Present Illness   63 y.o. female  No specialty comments available.    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 Enlarged pulmonary artery disproportionate to the size and number of pulmonary emboli. "  - started on heparin gtt.   11/07/23 - BLE U/S: Limited by nonvisualization of the right peroneal vein due to poor acoustic attenuation. Within this limit, negative examination for deep venous thrombosis in the bilateral lower extremities from the common femoral vein through the ankle.     Discharged on eliquis.  -----  Current Outpatient Medications   Medication Instructions    acetaminophen (TYLENOL) 1,000 mg, Oral, 3 Times Daily Scheduled, Two tablets by mouth three times daily CRUSHED.    albuterol 90 mcg/actuation metered dose inhaler 2 puffs, Every 6 Hours PRN    alcohol swabs swab Use as directed    apixaban (ELIQUIS) 5 mg tablet Take 2 tablets (10 mg total) by mouth 2 (two) times daily for 6 days, THEN 1 tablet (5 mg total) 2 (two) times daily.    atorvastatin (LIPITOR) 20 mg, Daily Scheduled    BD INSULIN SYRINGE 1 mL 29 gauge x 1/2" SYRINGE 2 Times Daily Scheduled    celebrate calcium citrate + vitamin D3 (CELEBRATE) soft chew 1 tablet, Oral, 2 Times Daily With Meals Scheduled    fluticasone propionate (FLONASE) 50 mcg/actuation nasal spray 2 sprays, Each Nostril, 2 Times Daily Scheduled    gabapentin (NEURONTIN) 600 mg, Oral, Daily At Bedtime Scheduled    glucosamine-chondroitin 500-400 mg capsule Oral, 2 Times Daily Scheduled, .  Temporarily stop taking after surgery. Follow up with your surgeon's office before restarting.    insulin glargine (LANTUS, BASAGLAR, SEMGLEE, REZVOGLAR) 8 Units, Subcutaneous, Daily At  Bedtime Scheduled    lactulose (ENULOSE) 10 g, Oral, Daily Scheduled, for constipation. May increase to 60 mL (40 g total ) once daily if necessary    losartan (COZAAR) 25 mg, Oral, Daily Scheduled, . Temporarily stop taking this medication after surgery. Follow up with your surgeon's office regarding when to restart.    metoprolol tartrate (LOPRESSOR) 12.5 mg, Oral, Twice Daily, . REPLACE metoprolol succinate (XL formulation) for 3 months    montelukast (SINGULAIR) 10 mg, Daily Scheduled    multivitamin-min-iron-FA-vit K (PROCARE BARIATRIC) 45 mg iron- 800 mcg-120 mcg capsule 1 capsule, Oral, Daily At Bedtime Scheduled    omeprazole (PRILOSEC) 20 mg, Oral, Daily Scheduled    ONETOUCH VERIO TEST STRIPS test strip TEST BLOOD SUGAR TWO TIMES A DAY. E11.65    oxymetazoline (AFRIN) 0.05 % nasal spray 2 sprays, Each Nostril, Twice Daily    pen needle, diabetic 32 gauge x 5/32" needle Use once daily as directed.    Lobbyist: Use as directed to discard insulin needles and lancets with each use.    sodium chloride (OCEAN) 0.65 % nasal spray 2 sprays, Each Nostril, 3 Times Daily    ursodioL (ACTIGALL) 300 mg, Oral, 2 Times Daily Scheduled     Allergies: Atorvastatin, Lovastatin, and Lisinopril  Past Medical History:   Diagnosis Date    Acid reflux     Asthma     Diabetes mellitus (CMS code)  Essential hypertension     High cholesterol     History of esophagogastroduodenoscopy (EGD)     Knee pain     Low back pain     Obesity     Sleep apnea     Uterine cancer (CMS code)      Past Surgical History:   Procedure Laterality Date    ANKLE FRACTURE SURGERY      CARPAL TUNNEL RELEASE      FINGER SURGERY      screw placement    HYSTERECTOMY      NASAL FRACTURE SURGERY      SHOULDER SURGERY      TUBAL LIGATION       Social History     Tobacco Use    Smoking status: Never    Smokeless tobacco: Never   Substance and Sexual Activity    Alcohol use: Not Currently     Comment: past rare social use; no history  of problem use    Drug use: Never     Comment: no history of Rx med abuse or illicit substance use    Sexual activity: Not Currently     Family History: No known family history of bleeding or clotting disorders nor hematologic malignancies.     Physical Exam     Hem Vital Signs as of 10/13/23       BP 128/51    Heart Rate 81    Temp 36.6 C (97.9 F)    *Resp 20    SpO2 99 %          General :   alert, no acute distress, cooperative and conversant   Eyes :   anicteric sclerae   ENMT :  normal external nose and ears   Respiratory :  normal inspiratory effort   Skin :  no obvious rash or jaundice   Neuro :  speech clear; grossly non-focal. independently ambulatory   Psych :  normal affect   Hematologic :  no bruising noted       Laboratory     Results for orders placed or performed during the hospital encounter of 11/02/23   Complete Blood Count   Result Value Ref Range    WBC Count 16.1 (H) 3.4 - 10.0 x10E9/L    RBC Count 4.50 4.00 - 5.20 x10E12/L    Hemoglobin 13.9 12.0 - 15.5 g/dL    Hematocrit 16.1 09.6 - 46.0 %    MCV 97 80 - 100 fL    MCH 30.9 26.0 - 34.0 pg    MCHC 31.7 31.0 - 36.0 g/dL    Platelet Count 045 409 - 450 x10E9/L    MPV 10.2 9.1 - 12.6 fL    RDW-CV 12.4 11.7 - 14.4 %   Complete Blood Count   Result Value Ref Range    WBC Count 13.9 (H) 3.4 - 10.0 x10E9/L    RBC Count 4.43 4.00 - 5.20 x10E12/L    Hemoglobin 13.8 12.0 - 15.5 g/dL    Hematocrit 81.1 91.4 - 46.0 %    MCV 96 80 - 100 fL    MCH 31.2 26.0 - 34.0 pg    MCHC 32.4 31.0 - 36.0 g/dL    Platelet Count 782 956 - 450 x10E9/L    MPV 10.6 9.1 - 12.6 fL    RDW-CV 12.5 11.7 - 14.4 %   Complete Blood Count   Result Value Ref Range    WBC Count 9.6 3.4 - 10.0 x10E9/L  RBC Count 4.34 4.00 - 5.20 x10E12/L    Hemoglobin 13.5 12.0 - 15.5 g/dL    Hematocrit 29.5 62.1 - 46.0 %    MCV 98 80 - 100 fL    MCH 31.1 26.0 - 34.0 pg    MCHC 31.7 31.0 - 36.0 g/dL    Platelet Count 308 657 - 450 x10E9/L    MPV 10.4 9.1 - 12.6 fL    RDW-CV 12.6 11.7 - 14.4 %   Basic  Metabolic Panel (NA, K, CL, CO2, BUN, CR, GLU, CA)   Result Value Ref Range    Urea Nitrogen, serum/plasma 6 (L) 7 - 25 mg/dL    Calcium, total, Serum / Plasma 8.6 8.4 - 10.5 mg/dL    Chloride, Serum / Plasma 107 101 - 110 mmol/L    Creatinine 0.72 0.55 - 1.02 mg/dL    eGFRcr 94 >84 ON/GEX/5.28U1    Potassium, Serum / Plasma 3.7 3.5 - 5.0 mmol/L    Glucose, non-fasting 149 70 - 199 mg/dL    Sodium, Serum / Plasma 141 135 - 145 mmol/L    Carbon Dioxide, Total 28 22 - 29 mmol/L    Anion Gap 6 4 - 14   Magnesium, Serum / Plasma   Result Value Ref Range    Magnesium, Serum / Plasma 2.0 1.6 - 2.6 mg/dL   Phosphorus, Serum / Plasma   Result Value Ref Range    Phosphorus, serum/plasma 3.5 2.3 - 4.7 mg/dL   Complete Blood Count   Result Value Ref Range    WBC Count 8.8 3.4 - 10.0 x10E9/L    RBC Count 4.02 4.00 - 5.20 x10E12/L    Hemoglobin 12.5 12.0 - 15.5 g/dL    Hematocrit 32.4 40.1 - 46.0 %    MCV 97 80 - 100 fL    MCH 31.1 26.0 - 34.0 pg    MCHC 32.0 31.0 - 36.0 g/dL    Platelet Count 027 (L) 140 - 450 x10E9/L    MPV 10.5 9.1 - 12.6 fL    RDW-CV 12.4 11.7 - 14.4 %   Prothrombin Time   Result Value Ref Range    PT 14.5 11.6 - 14.8 s    INR 1.2 0.9 - 1.2   Basic Metabolic Panel (NA, K, CL, CO2, BUN, CR, GLU, CA)   Result Value Ref Range    Urea Nitrogen, serum/plasma 9 7 - 25 mg/dL    Calcium, total, Serum / Plasma 8.3 (L) 8.4 - 10.5 mg/dL    Chloride, Serum / Plasma 105 101 - 110 mmol/L    Creatinine 0.68 0.55 - 1.02 mg/dL    eGFRcr 98 >25 DG/UYQ/0.34V4    Potassium, Serum / Plasma 3.6 3.5 - 5.0 mmol/L    Glucose, non-fasting 220 (H) 70 - 199 mg/dL    Sodium, Serum / Plasma 139 135 - 145 mmol/L    Carbon Dioxide, Total 27 22 - 29 mmol/L    Anion Gap 7 4 - 14   Magnesium, Serum / Plasma   Result Value Ref Range    Magnesium, Serum / Plasma 1.9 1.6 - 2.6 mg/dL   Phosphorus, Serum / Plasma   Result Value Ref Range    Phosphorus, serum/plasma 1.8 (L) 2.3 - 4.7 mg/dL   Complete Blood Count   Result Value Ref Range    WBC  Count 6.3 3.4 - 10.0 x10E9/L    RBC Count 3.90 (L) 4.00 - 5.20 x10E12/L    Hemoglobin 12.0 12.0 - 15.5  g/dL    Hematocrit 04.5 40.9 - 46.0 %    MCV 97 80 - 100 fL    MCH 30.8 26.0 - 34.0 pg    MCHC 31.7 31.0 - 36.0 g/dL    Platelet Count 811 (L) 140 - 450 x10E9/L    MPV 10.4 9.1 - 12.6 fL    RDW-CV 12.4 11.7 - 14.4 %   Basic Metabolic Panel (NA, K, CL, CO2, BUN, CR, GLU, CA)   Result Value Ref Range    Urea Nitrogen, serum/plasma 6 (L) 7 - 25 mg/dL    Calcium, total, Serum / Plasma 8.4 8.4 - 10.5 mg/dL    Chloride, Serum / Plasma 106 101 - 110 mmol/L    Creatinine 0.67 0.55 - 1.02 mg/dL    eGFRcr 99 >91 YN/WGN/5.62Z3    Potassium, Serum / Plasma 3.6 3.5 - 5.0 mmol/L    Glucose, non-fasting 179 70 - 199 mg/dL    Sodium, Serum / Plasma 143 135 - 145 mmol/L    Carbon Dioxide, Total 28 22 - 29 mmol/L    Anion Gap 9 4 - 14   Magnesium, Serum / Plasma   Result Value Ref Range    Magnesium, Serum / Plasma 2.0 1.6 - 2.6 mg/dL   Phosphorus, Serum / Plasma   Result Value Ref Range    Phosphorus, serum/plasma 2.8 2.3 - 4.7 mg/dL   Complete Blood Count   Result Value Ref Range    WBC Count 6.0 3.4 - 10.0 x10E9/L    RBC Count 4.07 4.00 - 5.20 x10E12/L    Hemoglobin 12.8 12.0 - 15.5 g/dL    Hematocrit 08.6 57.8 - 46.0 %    MCV 97 80 - 100 fL    MCH 31.4 26.0 - 34.0 pg    MCHC 32.4 31.0 - 36.0 g/dL    Platelet Count 469 (L) 140 - 450 x10E9/L    MPV 10.5 9.1 - 12.6 fL    RDW-CV 12.3 11.7 - 14.4 %   Basic Metabolic Panel (NA, K, CL, CO2, BUN, CR, GLU, CA)   Result Value Ref Range    Urea Nitrogen, serum/plasma 9 7 - 25 mg/dL    Calcium, total, Serum / Plasma 8.9 8.4 - 10.5 mg/dL    Chloride, Serum / Plasma 103 101 - 110 mmol/L    Creatinine 0.76 0.55 - 1.02 mg/dL    eGFRcr 89 >62 XB/MWU/1.32G4    Potassium, Serum / Plasma 3.4 (L) 3.5 - 5.0 mmol/L    Glucose, non-fasting 156 70 - 199 mg/dL    Sodium, Serum / Plasma 144 135 - 145 mmol/L    Carbon Dioxide, Total 30 (H) 22 - 29 mmol/L    Anion Gap 11 4 - 14   Magnesium, Serum / Plasma    Result Value Ref Range    Magnesium, Serum / Plasma 2.0 1.6 - 2.6 mg/dL   Phosphorus, Serum / Plasma   Result Value Ref Range    Phosphorus, serum/plasma 3.4 2.3 - 4.7 mg/dL   Activated Partial Thromboplastin Time   Result Value Ref Range    aPTT 27.9 21.6 - 30.8 s   PT/INR   Result Value Ref Range    PT 13.8 11.6 - 14.8 s    INR 1.1 0.9 - 1.2   Complete Blood Count   Result Value Ref Range    WBC Count 5.8 3.4 - 10.0 x10E9/L    RBC Count 4.12 4.00 - 5.20 x10E12/L    Hemoglobin 12.7  12.0 - 15.5 g/dL    Hematocrit 16.1 09.6 - 46.0 %    MCV 96 80 - 100 fL    MCH 30.8 26.0 - 34.0 pg    MCHC 32.2 31.0 - 36.0 g/dL    Platelet Count 045 (L) 140 - 450 x10E9/L    MPV 10.2 9.1 - 12.6 fL    RDW-CV 12.1 11.7 - 14.4 %   Complete Blood Count   Result Value Ref Range    WBC Count 5.7 3.4 - 10.0 x10E9/L    RBC Count 4.02 4.00 - 5.20 x10E12/L    Hemoglobin 12.7 12.0 - 15.5 g/dL    Hematocrit 40.9 81.1 - 46.0 %    MCV 95 80 - 100 fL    MCH 31.6 26.0 - 34.0 pg    MCHC 33.3 31.0 - 36.0 g/dL    Platelet Count 914 (L) 140 - 450 x10E9/L    MPV 10.3 9.1 - 12.6 fL    RDW-CV 12.3 11.7 - 14.4 %   Activated Partial Thromboplastin Time   Result Value Ref Range    aPTT >100.0 (HH) 21.6 - 30.8 s   Troponin I   Result Value Ref Range    Troponin I <0.02 0.00 - 0.04 ug/L   B-Type Natriuretic Peptide   Result Value Ref Range    BNP 12 <82 pg/mL   Basic Metabolic Panel (NA, K, CL, CO2, BUN, CR, GLU, CA)   Result Value Ref Range    Urea Nitrogen, serum/plasma 13 7 - 25 mg/dL    Calcium, total, Serum / Plasma 9.4 8.4 - 10.5 mg/dL    Chloride, Serum / Plasma 104 101 - 110 mmol/L    Creatinine 0.65 0.55 - 1.02 mg/dL    eGFRcr 99 >78 GN/FAO/1.30Q6    Potassium, Serum / Plasma 3.9 3.5 - 5.0 mmol/L    Glucose, non-fasting 159 70 - 199 mg/dL    Sodium, Serum / Plasma 142 135 - 145 mmol/L    Carbon Dioxide, Total 27 22 - 29 mmol/L    Anion Gap 11 4 - 14   Activated Partial Thromboplastin Time   Result Value Ref Range    aPTT 84.5 (HH) 21.6 - 30.8 s    Activated Partial Thromboplastin Time   Result Value Ref Range    aPTT 59.8 (H) 21.6 - 30.8 s   Complete Blood Count   Result Value Ref Range    WBC Count 6.0 3.4 - 10.0 x10E9/L    RBC Count 4.18 4.00 - 5.20 x10E12/L    Hemoglobin 12.9 12.0 - 15.5 g/dL    Hematocrit 57.8 46.9 - 46.0 %    MCV 95 80 - 100 fL    MCH 30.9 26.0 - 34.0 pg    MCHC 32.5 31.0 - 36.0 g/dL    Platelet Count 629 (L) 140 - 450 x10E9/L    MPV 10.5 9.1 - 12.6 fL    RDW-CV 12.3 11.7 - 14.4 %   Activated Partial Thromboplastin Time   Result Value Ref Range    aPTT 59.8 (H) 21.6 - 30.8 s   Basic Metabolic Panel (NA, K, CL, CO2, BUN, CR, GLU, CA)   Result Value Ref Range    Urea Nitrogen, serum/plasma 18 7 - 25 mg/dL    Calcium, total, Serum / Plasma 9.4 8.4 - 10.5 mg/dL    Chloride, Serum / Plasma 101 101 - 110 mmol/L    Creatinine 0.67 0.55 - 1.02 mg/dL    eGFRcr 99 >  59 mL/min/1.62m2    Potassium, Serum / Plasma 3.6 3.5 - 5.0 mmol/L    Glucose, non-fasting 192 70 - 199 mg/dL    Sodium, Serum / Plasma 138 135 - 145 mmol/L    Carbon Dioxide, Total 28 22 - 29 mmol/L    Anion Gap 9 4 - 14   Magnesium, Serum / Plasma   Result Value Ref Range    Magnesium, Serum / Plasma 2.1 1.6 - 2.6 mg/dL   Phosphorus, Serum / Plasma   Result Value Ref Range    Phosphorus, serum/plasma 3.7 2.3 - 4.7 mg/dL   Complete Blood Count   Result Value Ref Range    WBC Count 4.6 3.4 - 10.0 x10E9/L    RBC Count 4.32 4.00 - 5.20 x10E12/L    Hemoglobin 13.3 12.0 - 15.5 g/dL    Hematocrit 16.1 09.6 - 46.0 %    MCV 93 80 - 100 fL    MCH 30.8 26.0 - 34.0 pg    MCHC 33.1 31.0 - 36.0 g/dL    Platelet Count 045 (L) 140 - 450 x10E9/L    MPV 10.6 9.1 - 12.6 fL    RDW-CV 12.3 11.7 - 14.4 %   Basic Metabolic Panel (NA, K, CL, CO2, BUN, CR, GLU, CA)   Result Value Ref Range    Urea Nitrogen, serum/plasma 15 7 - 25 mg/dL    Calcium, total, Serum / Plasma 9.4 8.4 - 10.5 mg/dL    Chloride, Serum / Plasma 102 101 - 110 mmol/L    Creatinine 0.72 0.55 - 1.02 mg/dL    eGFRcr 94 >40 JW/JXB/1.47W2     Potassium, Serum / Plasma 3.6 3.5 - 5.0 mmol/L    Glucose, non-fasting 169 70 - 199 mg/dL    Sodium, Serum / Plasma 140 135 - 145 mmol/L    Carbon Dioxide, Total 27 22 - 29 mmol/L    Anion Gap 11 4 - 14   Magnesium, Serum / Plasma   Result Value Ref Range    Magnesium, Serum / Plasma 2.2 1.6 - 2.6 mg/dL   Phosphorus, Serum / Plasma   Result Value Ref Range    Phosphorus, serum/plasma 4.6 2.3 - 4.7 mg/dL   POCT Glucose   Result Value Ref Range    Glucose, Glucometer 143 70 - 199 mg/dL   POCT Glucose   Result Value Ref Range    Glucose, Glucometer 192 70 - 199 mg/dL   POCT Glucose   Result Value Ref Range    Glucose, Glucometer 219 (H) 70 - 199 mg/dL   POCT Glucose   Result Value Ref Range    Glucose, Glucometer 219 (H) 70 - 199 mg/dL   POCT Glucose   Result Value Ref Range    Glucose, Glucometer 187 70 - 199 mg/dL   POCT Glucose   Result Value Ref Range    Glucose, Glucometer 154 70 - 199 mg/dL   POCT Glucose   Result Value Ref Range    Glucose, Glucometer 184 70 - 199 mg/dL   POCT Glucose   Result Value Ref Range    Glucose, Glucometer 189 70 - 199 mg/dL   POCT Glucose   Result Value Ref Range    Glucose, Glucometer 159 70 - 199 mg/dL   POCT Glucose   Result Value Ref Range    Glucose, Glucometer 163 70 - 199 mg/dL   POCT Glucose   Result Value Ref Range    Glucose, Glucometer 193 70 - 199  mg/dL   POCT Glucose   Result Value Ref Range    Glucose, Glucometer 155 70 - 199 mg/dL   POCT Glucose   Result Value Ref Range    Glucose, Glucometer 130 70 - 199 mg/dL   POCT Glucose   Result Value Ref Range    Glucose, Glucometer 157 70 - 199 mg/dL   POCT Glucose   Result Value Ref Range    Glucose, Glucometer 137 70 - 199 mg/dL   POCT Glucose   Result Value Ref Range    Glucose, Glucometer 150 70 - 199 mg/dL   POCT Glucose   Result Value Ref Range    Glucose, Glucometer 184 70 - 199 mg/dL   POCT Glucose   Result Value Ref Range    Glucose, Glucometer 153 70 - 199 mg/dL   POCT Glucose   Result Value Ref Range    Glucose,  Glucometer 154 70 - 199 mg/dL   POCT Glucose   Result Value Ref Range    Glucose, Glucometer 203 (H) 70 - 199 mg/dL   POCT Glucose   Result Value Ref Range    Glucose, Glucometer 163 70 - 199 mg/dL   POCT Glucose   Result Value Ref Range    Glucose, Glucometer 165 70 - 199 mg/dL   POCT Glucose   Result Value Ref Range    Glucose, Glucometer 172 70 - 199 mg/dL   POCT Glucose   Result Value Ref Range    Glucose, Glucometer 186 70 - 199 mg/dL   POCT Glucose   Result Value Ref Range    Glucose, Glucometer 155 70 - 199 mg/dL   POCT Glucose   Result Value Ref Range    Glucose, Glucometer 169 70 - 199 mg/dL   POCT Glucose   Result Value Ref Range    Glucose, Glucometer 177 70 - 199 mg/dL   POCT Glucose   Result Value Ref Range    Glucose, Glucometer 156 70 - 199 mg/dL   POCT Glucose   Result Value Ref Range    Glucose, Glucometer 167 70 - 199 mg/dL   POCT Glucose   Result Value Ref Range    Glucose, Glucometer 182 70 - 199 mg/dL   POCT Glucose   Result Value Ref Range    Glucose, Glucometer 286 (H) 70 - 199 mg/dL   POCT Glucose   Result Value Ref Range    Glucose, Glucometer 145 70 - 199 mg/dL   POCT Glucose   Result Value Ref Range    Glucose, Glucometer 162 70 - 199 mg/dL   POCT Glucose   Result Value Ref Range    Glucose, Glucometer 182 70 - 199 mg/dL   POCT Glucose   Result Value Ref Range    Glucose, Glucometer 196 70 - 199 mg/dL        Radiology   US Doppler Lower Extremity Venous, Bilateral    Result Date: 11/07/2023  Impression Limited by nonvisualization of the right peroneal vein due to poor acoustic attenuation.  Within this limit, negative examination for deep venous thrombosis in the bilateral lower extremities from the common femoral vein through the ankle. Report dictated by: Philis Fendt, MD, signed by: Linde Gillis, MD Department of Radiology and Biomedical Imaging    CTA Chest Pulmonary Embolism (CTPE)    Result Date: 11/07/2023  Impression Several scattered subsegmental pulmonary emboli as  described above. Enlarged pulmonary artery disproportionate to the size and number of pulmonary emboli. //  Impression 1 secure messaged to  Murray Hodgkins, NP (General Surgery) by Dr. Esmeralda Links, MD (Radiology) on 11/06/2023 10:35 PM.// Report dictated by: Esmeralda Links, MD, signed by: Dorena Bodo, MD Department of Radiology and Biomedical Imaging    XR Chest 1 View (AP Portable)    Result Date: 11/05/2023  Impression FINDINGS/IMPRESSION: Improved lung volumes however with persistent perihilar and bibasilar airspace opacities which may reflect multifocal infection, aspiration, and/or edema. No pleural effusion or pneumothorax. Unchanged cardiac and mediastinal contours. Report dictated by: Viviann Spare, MD, signed by: Viviann Spare, MD Department of Radiology and Biomedical Imaging    XR Chest 1 View (AP Portable)    Result Date: 11/03/2023  Impression FINDINGS/IMPRESSION: Diffuse hazy interstitial and airspace opacities which may be partially exaggerated due to low lung volumes however could reflect pulmonary edema or diffuse infection such as viral infection. No significant pleural effusion or pneumothorax. Unremarkable cardiac and mediastinal contours when accounting for low lung volumes and portable technique. Report dictated by: Viviann Spare, MD, signed by: Viviann Spare, MD Department of Radiology and Biomedical Imaging    NM Myocardial Pharmacologic Stress Test (Radiology Performed)    Result Date: 09/22/2023  Impression 1.  Negative study for ischemia or scar. 2.  Overall gated left ventricular systolic function was normal with a calculated LVEF of 66% at stress and 63% at rest. 3.  No left ventricular dilatation. Report dictated by: Clydell Hakim, MD PhD, signed by: Clydell Hakim, MD PhD Department of Radiology and Biomedical Imaging      Pathology        Assessment and Plan   In Summary, [name] is a 63 y.o. female with       Thank you for this  referral. It is my privilege to participate in the hematologic care of your patients.    Irving Shows, MD   Department of Medicine  Hematology  [facility]  office [phone]  fax [phone]  01/11/2024    Patient Care Team:  Oralia Rud, DO as PCP - General (Family Medicine)           {NON MALIG HEME MDM (Optional):39646}    I performed this evaluation using real-time telehealth tools, including a live video Zoom connection between my location and the patient's location. Prior to initiating, the patient consented to perform this evaluation using telehealth tools.    {Failed Integrated Video Visit Note (optional):[id number]::" "}               [facility]   Today 01/11/2024 I evaluated [name] (DOB [birth year]  MRN [id number]) referred by Lilly Cove, NP for ***. I reviewed her medical data and summarize them here.       History of Present Illness   63 y.o. female  No specialty comments available.    -----  Current Outpatient Medications   Medication Instructions    acetaminophen (TYLENOL) 1,000 mg, Oral, 3 Times Daily Scheduled, Two tablets by mouth three times daily CRUSHED.    albuterol 90 mcg/actuation metered dose inhaler 2 puffs, Every 6 Hours PRN    alcohol swabs swab Use as directed    apixaban (ELIQUIS) 5 mg tablet Take 2 tablets (10 mg total) by mouth 2 (two) times daily for 6 days, THEN 1 tablet (5 mg total) 2 (two) times daily.    atorvastatin (LIPITOR) 20 mg, Daily Scheduled    BD INSULIN SYRINGE  1 mL 29 gauge x 1/2" SYRINGE 2 Times Daily Scheduled    celebrate calcium citrate + vitamin D3 (CELEBRATE) soft chew 1 tablet, Oral, 2 Times Daily With Meals Scheduled    fluticasone propionate (FLONASE) 50 mcg/actuation nasal spray 2 sprays, Each Nostril, 2 Times Daily Scheduled    gabapentin (NEURONTIN) 600 mg, Oral, Daily At Bedtime Scheduled    glucosamine-chondroitin 500-400 mg capsule Oral, 2 Times Daily Scheduled, .  Temporarily stop  taking after surgery. Follow up with your surgeon's office before restarting.    insulin glargine (LANTUS, BASAGLAR, SEMGLEE, REZVOGLAR) 8 Units, Subcutaneous, Daily At Bedtime Scheduled    lactulose (ENULOSE) 10 g, Oral, Daily Scheduled, for constipation. May increase to 60 mL (40 g total ) once daily if necessary    losartan (COZAAR) 25 mg, Oral, Daily Scheduled, . Temporarily stop taking this medication after surgery. Follow up with your surgeon's office regarding when to restart.    metoprolol tartrate (LOPRESSOR) 12.5 mg, Oral, Twice Daily, . REPLACE metoprolol succinate (XL formulation) for 3 months    montelukast (SINGULAIR) 10 mg, Daily Scheduled    multivitamin-min-iron-FA-vit K (PROCARE BARIATRIC) 45 mg iron- 800 mcg-120 mcg capsule 1 capsule, Oral, Daily At Bedtime Scheduled    omeprazole (PRILOSEC) 20 mg, Oral, Daily Scheduled    ONETOUCH VERIO TEST STRIPS test strip TEST BLOOD SUGAR TWO TIMES A DAY. E11.65    oxymetazoline (AFRIN) 0.05 % nasal spray 2 sprays, Each Nostril, Twice Daily    pen needle, diabetic 32 gauge x 5/32" needle Use once daily as directed.    Lobbyist: Use as directed to discard insulin needles and lancets with each use.    sodium chloride (OCEAN) 0.65 % nasal spray 2 sprays, Each Nostril, 3 Times Daily    ursodioL (ACTIGALL) 300 mg, Oral, 2 Times Daily Scheduled     Allergies: Atorvastatin, Lovastatin, and Lisinopril  Past Medical History:   Diagnosis Date    Acid reflux     Asthma     Diabetes mellitus (CMS code)     Essential hypertension     High cholesterol     History of esophagogastroduodenoscopy (EGD)     Knee pain     Low back pain     Obesity     Sleep apnea     Uterine cancer (CMS code)      Past Surgical History:   Procedure Laterality Date    ANKLE FRACTURE SURGERY      CARPAL TUNNEL RELEASE      FINGER SURGERY      screw placement    HYSTERECTOMY      NASAL FRACTURE SURGERY      SHOULDER SURGERY      TUBAL LIGATION       Social History      Tobacco Use    Smoking status: Never    Smokeless tobacco: Never   Substance and Sexual Activity    Alcohol use: Not Currently     Comment: past rare social use; no history of problem use    Drug use: Never     Comment: no history of Rx med abuse or illicit substance use    Sexual activity: Not Currently     Family History: No known family history of bleeding or clotting disorders nor hematologic malignancies.     Physical Exam     Hem Vital Signs as of 10/13/23       BP 128/51    Heart Rate 81  Temp 36.6 C (97.9 F)    *Resp 20    SpO2 99 %          General :   alert, no acute distress, cooperative and conversant   Eyes :   anicteric sclerae   ENMT :  normal external nose and ears   Respiratory :  normal inspiratory effort   Skin :  no obvious rash or jaundice   Neuro :  speech clear; grossly non-focal. independently ambulatory   Psych :  normal affect   Hematologic :  no bruising noted       Laboratory     Results for orders placed or performed during the hospital encounter of 11/02/23   Complete Blood Count   Result Value Ref Range    WBC Count 16.1 (H) 3.4 - 10.0 x10E9/L    RBC Count 4.50 4.00 - 5.20 x10E12/L    Hemoglobin 13.9 12.0 - 15.5 g/dL    Hematocrit 16.1 09.6 - 46.0 %    MCV 97 80 - 100 fL    MCH 30.9 26.0 - 34.0 pg    MCHC 31.7 31.0 - 36.0 g/dL    Platelet Count 045 409 - 450 x10E9/L    MPV 10.2 9.1 - 12.6 fL    RDW-CV 12.4 11.7 - 14.4 %   Complete Blood Count   Result Value Ref Range    WBC Count 13.9 (H) 3.4 - 10.0 x10E9/L    RBC Count 4.43 4.00 - 5.20 x10E12/L    Hemoglobin 13.8 12.0 - 15.5 g/dL    Hematocrit 81.1 91.4 - 46.0 %    MCV 96 80 - 100 fL    MCH 31.2 26.0 - 34.0 pg    MCHC 32.4 31.0 - 36.0 g/dL    Platelet Count 782 956 - 450 x10E9/L    MPV 10.6 9.1 - 12.6 fL    RDW-CV 12.5 11.7 - 14.4 %   Complete Blood Count   Result Value Ref Range    WBC Count 9.6 3.4 - 10.0 x10E9/L    RBC Count 4.34 4.00 - 5.20 x10E12/L    Hemoglobin 13.5 12.0 - 15.5 g/dL    Hematocrit 21.3 08.6 - 46.0 %     MCV 98 80 - 100 fL    MCH 31.1 26.0 - 34.0 pg    MCHC 31.7 31.0 - 36.0 g/dL    Platelet Count 578 469 - 450 x10E9/L    MPV 10.4 9.1 - 12.6 fL    RDW-CV 12.6 11.7 - 14.4 %   Basic Metabolic Panel (NA, K, CL, CO2, BUN, CR, GLU, CA)   Result Value Ref Range    Urea Nitrogen, serum/plasma 6 (L) 7 - 25 mg/dL    Calcium, total, Serum / Plasma 8.6 8.4 - 10.5 mg/dL    Chloride, Serum / Plasma 107 101 - 110 mmol/L    Creatinine 0.72 0.55 - 1.02 mg/dL    eGFRcr 94 >62 XB/MWU/1.32G4    Potassium, Serum / Plasma 3.7 3.5 - 5.0 mmol/L    Glucose, non-fasting 149 70 - 199 mg/dL    Sodium, Serum / Plasma 141 135 - 145 mmol/L    Carbon Dioxide, Total 28 22 - 29 mmol/L    Anion Gap 6 4 - 14   Magnesium, Serum / Plasma   Result Value Ref Range    Magnesium, Serum / Plasma 2.0 1.6 - 2.6 mg/dL   Phosphorus, Serum / Plasma   Result Value Ref Range  Phosphorus, serum/plasma 3.5 2.3 - 4.7 mg/dL   Complete Blood Count   Result Value Ref Range    WBC Count 8.8 3.4 - 10.0 x10E9/L    RBC Count 4.02 4.00 - 5.20 x10E12/L    Hemoglobin 12.5 12.0 - 15.5 g/dL    Hematocrit 14.7 82.9 - 46.0 %    MCV 97 80 - 100 fL    MCH 31.1 26.0 - 34.0 pg    MCHC 32.0 31.0 - 36.0 g/dL    Platelet Count 562 (L) 140 - 450 x10E9/L    MPV 10.5 9.1 - 12.6 fL    RDW-CV 12.4 11.7 - 14.4 %   Prothrombin Time   Result Value Ref Range    PT 14.5 11.6 - 14.8 s    INR 1.2 0.9 - 1.2   Basic Metabolic Panel (NA, K, CL, CO2, BUN, CR, GLU, CA)   Result Value Ref Range    Urea Nitrogen, serum/plasma 9 7 - 25 mg/dL    Calcium, total, Serum / Plasma 8.3 (L) 8.4 - 10.5 mg/dL    Chloride, Serum / Plasma 105 101 - 110 mmol/L    Creatinine 0.68 0.55 - 1.02 mg/dL    eGFRcr 98 >13 YQ/MVH/8.46N6    Potassium, Serum / Plasma 3.6 3.5 - 5.0 mmol/L    Glucose, non-fasting 220 (H) 70 - 199 mg/dL    Sodium, Serum / Plasma 139 135 - 145 mmol/L    Carbon Dioxide, Total 27 22 - 29 mmol/L    Anion Gap 7 4 - 14   Magnesium, Serum / Plasma   Result Value Ref Range    Magnesium, Serum / Plasma 1.9  1.6 - 2.6 mg/dL   Phosphorus, Serum / Plasma   Result Value Ref Range    Phosphorus, serum/plasma 1.8 (L) 2.3 - 4.7 mg/dL   Complete Blood Count   Result Value Ref Range    WBC Count 6.3 3.4 - 10.0 x10E9/L    RBC Count 3.90 (L) 4.00 - 5.20 x10E12/L    Hemoglobin 12.0 12.0 - 15.5 g/dL    Hematocrit 29.5 28.4 - 46.0 %    MCV 97 80 - 100 fL    MCH 30.8 26.0 - 34.0 pg    MCHC 31.7 31.0 - 36.0 g/dL    Platelet Count 132 (L) 140 - 450 x10E9/L    MPV 10.4 9.1 - 12.6 fL    RDW-CV 12.4 11.7 - 14.4 %   Basic Metabolic Panel (NA, K, CL, CO2, BUN, CR, GLU, CA)   Result Value Ref Range    Urea Nitrogen, serum/plasma 6 (L) 7 - 25 mg/dL    Calcium, total, Serum / Plasma 8.4 8.4 - 10.5 mg/dL    Chloride, Serum / Plasma 106 101 - 110 mmol/L    Creatinine 0.67 0.55 - 1.02 mg/dL    eGFRcr 99 >44 WN/UUV/2.53G6    Potassium, Serum / Plasma 3.6 3.5 - 5.0 mmol/L    Glucose, non-fasting 179 70 - 199 mg/dL    Sodium, Serum / Plasma 143 135 - 145 mmol/L    Carbon Dioxide, Total 28 22 - 29 mmol/L    Anion Gap 9 4 - 14   Magnesium, Serum / Plasma   Result Value Ref Range    Magnesium, Serum / Plasma 2.0 1.6 - 2.6 mg/dL   Phosphorus, Serum / Plasma   Result Value Ref Range    Phosphorus, serum/plasma 2.8 2.3 - 4.7 mg/dL   Complete Blood Count   Result  Value Ref Range    WBC Count 6.0 3.4 - 10.0 x10E9/L    RBC Count 4.07 4.00 - 5.20 x10E12/L    Hemoglobin 12.8 12.0 - 15.5 g/dL    Hematocrit 01.0 27.2 - 46.0 %    MCV 97 80 - 100 fL    MCH 31.4 26.0 - 34.0 pg    MCHC 32.4 31.0 - 36.0 g/dL    Platelet Count 536 (L) 140 - 450 x10E9/L    MPV 10.5 9.1 - 12.6 fL    RDW-CV 12.3 11.7 - 14.4 %   Basic Metabolic Panel (NA, K, CL, CO2, BUN, CR, GLU, CA)   Result Value Ref Range    Urea Nitrogen, serum/plasma 9 7 - 25 mg/dL    Calcium, total, Serum / Plasma 8.9 8.4 - 10.5 mg/dL    Chloride, Serum / Plasma 103 101 - 110 mmol/L    Creatinine 0.76 0.55 - 1.02 mg/dL    eGFRcr 89 >64 QI/HKV/4.25Z5    Potassium, Serum / Plasma 3.4 (L) 3.5 - 5.0 mmol/L    Glucose,  non-fasting 156 70 - 199 mg/dL    Sodium, Serum / Plasma 144 135 - 145 mmol/L    Carbon Dioxide, Total 30 (H) 22 - 29 mmol/L    Anion Gap 11 4 - 14   Magnesium, Serum / Plasma   Result Value Ref Range    Magnesium, Serum / Plasma 2.0 1.6 - 2.6 mg/dL   Phosphorus, Serum / Plasma   Result Value Ref Range    Phosphorus, serum/plasma 3.4 2.3 - 4.7 mg/dL   Activated Partial Thromboplastin Time   Result Value Ref Range    aPTT 27.9 21.6 - 30.8 s   PT/INR   Result Value Ref Range    PT 13.8 11.6 - 14.8 s    INR 1.1 0.9 - 1.2   Complete Blood Count   Result Value Ref Range    WBC Count 5.8 3.4 - 10.0 x10E9/L    RBC Count 4.12 4.00 - 5.20 x10E12/L    Hemoglobin 12.7 12.0 - 15.5 g/dL    Hematocrit 63.8 75.6 - 46.0 %    MCV 96 80 - 100 fL    MCH 30.8 26.0 - 34.0 pg    MCHC 32.2 31.0 - 36.0 g/dL    Platelet Count 433 (L) 140 - 450 x10E9/L    MPV 10.2 9.1 - 12.6 fL    RDW-CV 12.1 11.7 - 14.4 %   Complete Blood Count   Result Value Ref Range    WBC Count 5.7 3.4 - 10.0 x10E9/L    RBC Count 4.02 4.00 - 5.20 x10E12/L    Hemoglobin 12.7 12.0 - 15.5 g/dL    Hematocrit 29.5 18.8 - 46.0 %    MCV 95 80 - 100 fL    MCH 31.6 26.0 - 34.0 pg    MCHC 33.3 31.0 - 36.0 g/dL    Platelet Count 416 (L) 140 - 450 x10E9/L    MPV 10.3 9.1 - 12.6 fL    RDW-CV 12.3 11.7 - 14.4 %   Activated Partial Thromboplastin Time   Result Value Ref Range    aPTT >100.0 (HH) 21.6 - 30.8 s   Troponin I   Result Value Ref Range    Troponin I <0.02 0.00 - 0.04 ug/L   B-Type Natriuretic Peptide   Result Value Ref Range    BNP 12 <82 pg/mL   Basic Metabolic Panel (NA, K, CL, CO2, BUN, CR, GLU,  CA)   Result Value Ref Range    Urea Nitrogen, serum/plasma 13 7 - 25 mg/dL    Calcium, total, Serum / Plasma 9.4 8.4 - 10.5 mg/dL    Chloride, Serum / Plasma 104 101 - 110 mmol/L    Creatinine 0.65 0.55 - 1.02 mg/dL    eGFRcr 99 >16 XW/RUE/4.54U9    Potassium, Serum / Plasma 3.9 3.5 - 5.0 mmol/L    Glucose, non-fasting 159 70 - 199 mg/dL    Sodium, Serum / Plasma 142 135 - 145  mmol/L    Carbon Dioxide, Total 27 22 - 29 mmol/L    Anion Gap 11 4 - 14   Activated Partial Thromboplastin Time   Result Value Ref Range    aPTT 84.5 (HH) 21.6 - 30.8 s   Activated Partial Thromboplastin Time   Result Value Ref Range    aPTT 59.8 (H) 21.6 - 30.8 s   Complete Blood Count   Result Value Ref Range    WBC Count 6.0 3.4 - 10.0 x10E9/L    RBC Count 4.18 4.00 - 5.20 x10E12/L    Hemoglobin 12.9 12.0 - 15.5 g/dL    Hematocrit 81.1 91.4 - 46.0 %    MCV 95 80 - 100 fL    MCH 30.9 26.0 - 34.0 pg    MCHC 32.5 31.0 - 36.0 g/dL    Platelet Count 782 (L) 140 - 450 x10E9/L    MPV 10.5 9.1 - 12.6 fL    RDW-CV 12.3 11.7 - 14.4 %   Activated Partial Thromboplastin Time   Result Value Ref Range    aPTT 59.8 (H) 21.6 - 30.8 s   Basic Metabolic Panel (NA, K, CL, CO2, BUN, CR, GLU, CA)   Result Value Ref Range    Urea Nitrogen, serum/plasma 18 7 - 25 mg/dL    Calcium, total, Serum / Plasma 9.4 8.4 - 10.5 mg/dL    Chloride, Serum / Plasma 101 101 - 110 mmol/L    Creatinine 0.67 0.55 - 1.02 mg/dL    eGFRcr 99 >95 AO/ZHY/8.65H8    Potassium, Serum / Plasma 3.6 3.5 - 5.0 mmol/L    Glucose, non-fasting 192 70 - 199 mg/dL    Sodium, Serum / Plasma 138 135 - 145 mmol/L    Carbon Dioxide, Total 28 22 - 29 mmol/L    Anion Gap 9 4 - 14   Magnesium, Serum / Plasma   Result Value Ref Range    Magnesium, Serum / Plasma 2.1 1.6 - 2.6 mg/dL   Phosphorus, Serum / Plasma   Result Value Ref Range    Phosphorus, serum/plasma 3.7 2.3 - 4.7 mg/dL   Complete Blood Count   Result Value Ref Range    WBC Count 4.6 3.4 - 10.0 x10E9/L    RBC Count 4.32 4.00 - 5.20 x10E12/L    Hemoglobin 13.3 12.0 - 15.5 g/dL    Hematocrit 46.9 62.9 - 46.0 %    MCV 93 80 - 100 fL    MCH 30.8 26.0 - 34.0 pg    MCHC 33.1 31.0 - 36.0 g/dL    Platelet Count 528 (L) 140 - 450 x10E9/L    MPV 10.6 9.1 - 12.6 fL    RDW-CV 12.3 11.7 - 14.4 %   Basic Metabolic Panel (NA, K, CL, CO2, BUN, CR, GLU, CA)   Result Value Ref Range    Urea Nitrogen, serum/plasma 15 7 - 25 mg/dL     Calcium, total, Serum / Plasma  9.4 8.4 - 10.5 mg/dL    Chloride, Serum / Plasma 102 101 - 110 mmol/L    Creatinine 0.72 0.55 - 1.02 mg/dL    eGFRcr 94 >16 XW/RUE/4.54U9    Potassium, Serum / Plasma 3.6 3.5 - 5.0 mmol/L    Glucose, non-fasting 169 70 - 199 mg/dL    Sodium, Serum / Plasma 140 135 - 145 mmol/L    Carbon Dioxide, Total 27 22 - 29 mmol/L    Anion Gap 11 4 - 14   Magnesium, Serum / Plasma   Result Value Ref Range    Magnesium, Serum / Plasma 2.2 1.6 - 2.6 mg/dL   Phosphorus, Serum / Plasma   Result Value Ref Range    Phosphorus, serum/plasma 4.6 2.3 - 4.7 mg/dL   POCT Glucose   Result Value Ref Range    Glucose, Glucometer 143 70 - 199 mg/dL   POCT Glucose   Result Value Ref Range    Glucose, Glucometer 192 70 - 199 mg/dL   POCT Glucose   Result Value Ref Range    Glucose, Glucometer 219 (H) 70 - 199 mg/dL   POCT Glucose   Result Value Ref Range    Glucose, Glucometer 219 (H) 70 - 199 mg/dL   POCT Glucose   Result Value Ref Range    Glucose, Glucometer 187 70 - 199 mg/dL   POCT Glucose   Result Value Ref Range    Glucose, Glucometer 154 70 - 199 mg/dL   POCT Glucose   Result Value Ref Range    Glucose, Glucometer 184 70 - 199 mg/dL   POCT Glucose   Result Value Ref Range    Glucose, Glucometer 189 70 - 199 mg/dL   POCT Glucose   Result Value Ref Range    Glucose, Glucometer 159 70 - 199 mg/dL   POCT Glucose   Result Value Ref Range    Glucose, Glucometer 163 70 - 199 mg/dL   POCT Glucose   Result Value Ref Range    Glucose, Glucometer 193 70 - 199 mg/dL   POCT Glucose   Result Value Ref Range    Glucose, Glucometer 155 70 - 199 mg/dL   POCT Glucose   Result Value Ref Range    Glucose, Glucometer 130 70 - 199 mg/dL   POCT Glucose   Result Value Ref Range    Glucose, Glucometer 157 70 - 199 mg/dL   POCT Glucose   Result Value Ref Range    Glucose, Glucometer 137 70 - 199 mg/dL   POCT Glucose   Result Value Ref Range    Glucose, Glucometer 150 70 - 199 mg/dL   POCT Glucose   Result Value Ref Range     Glucose, Glucometer 184 70 - 199 mg/dL   POCT Glucose   Result Value Ref Range    Glucose, Glucometer 153 70 - 199 mg/dL   POCT Glucose   Result Value Ref Range    Glucose, Glucometer 154 70 - 199 mg/dL   POCT Glucose   Result Value Ref Range    Glucose, Glucometer 203 (H) 70 - 199 mg/dL   POCT Glucose   Result Value Ref Range    Glucose, Glucometer 163 70 - 199 mg/dL   POCT Glucose   Result Value Ref Range    Glucose, Glucometer 165 70 - 199 mg/dL   POCT Glucose   Result Value Ref Range    Glucose, Glucometer 172 70 - 199 mg/dL   POCT  Glucose   Result Value Ref Range    Glucose, Glucometer 186 70 - 199 mg/dL   POCT Glucose   Result Value Ref Range    Glucose, Glucometer 155 70 - 199 mg/dL   POCT Glucose   Result Value Ref Range    Glucose, Glucometer 169 70 - 199 mg/dL   POCT Glucose   Result Value Ref Range    Glucose, Glucometer 177 70 - 199 mg/dL   POCT Glucose   Result Value Ref Range    Glucose, Glucometer 156 70 - 199 mg/dL   POCT Glucose   Result Value Ref Range    Glucose, Glucometer 167 70 - 199 mg/dL   POCT Glucose   Result Value Ref Range    Glucose, Glucometer 182 70 - 199 mg/dL   POCT Glucose   Result Value Ref Range    Glucose, Glucometer 286 (H) 70 - 199 mg/dL   POCT Glucose   Result Value Ref Range    Glucose, Glucometer 145 70 - 199 mg/dL   POCT Glucose   Result Value Ref Range    Glucose, Glucometer 162 70 - 199 mg/dL   POCT Glucose   Result Value Ref Range    Glucose, Glucometer 182 70 - 199 mg/dL   POCT Glucose   Result Value Ref Range    Glucose, Glucometer 196 70 - 199 mg/dL        Radiology   US Doppler Lower Extremity Venous, Bilateral    Result Date: 11/07/2023  Impression Limited by nonvisualization of the right peroneal vein due to poor acoustic attenuation.  Within this limit, negative examination for deep venous thrombosis in the bilateral lower extremities from the common femoral vein through the ankle. Report dictated by: Philis Fendt, MD, signed by: Linde Gillis, MD  Department of Radiology and Biomedical Imaging    CTA Chest Pulmonary Embolism (CTPE)    Result Date: 11/07/2023  Impression Several scattered subsegmental pulmonary emboli as described above. Enlarged pulmonary artery disproportionate to the size and number of pulmonary emboli. //Impression 1 secure messaged to  Murray Hodgkins, NP (General Surgery) by Dr. Esmeralda Links, MD (Radiology) on 11/06/2023 10:35 PM.// Report dictated by: Esmeralda Links, MD, signed by: Dorena Bodo, MD Department of Radiology and Biomedical Imaging    XR Chest 1 View (AP Portable)    Result Date: 11/05/2023  Impression FINDINGS/IMPRESSION: Improved lung volumes however with persistent perihilar and bibasilar airspace opacities which may reflect multifocal infection, aspiration, and/or edema. No pleural effusion or pneumothorax. Unchanged cardiac and mediastinal contours. Report dictated by: Viviann Spare, MD, signed by: Viviann Spare, MD Department of Radiology and Biomedical Imaging    XR Chest 1 View (AP Portable)    Result Date: 11/03/2023  Impression FINDINGS/IMPRESSION: Diffuse hazy interstitial and airspace opacities which may be partially exaggerated due to low lung volumes however could reflect pulmonary edema or diffuse infection such as viral infection. No significant pleural effusion or pneumothorax. Unremarkable cardiac and mediastinal contours when accounting for low lung volumes and portable technique. Report dictated by: Viviann Spare, MD, signed by: Viviann Spare, MD Department of Radiology and Biomedical Imaging    NM Myocardial Pharmacologic Stress Test (Radiology Performed)    Result Date: 09/22/2023  Impression 1.  Negative study for ischemia or scar. 2.  Overall gated left ventricular systolic function was normal with a calculated LVEF of 66% at stress and 63% at rest. 3.  No left ventricular dilatation. Report dictated by: Elita Quick  Aura Camps, MD PhD, signed by: Eilleen Kempf Arie Sabina, MD PhD  Department of Radiology and Biomedical Imaging      Pathology        Assessment and Plan   In Summary, [name] is a 63 y.o. female with       May have some impaired absoprtion (PMID: [id number])      Thank you for this referral. It is my privilege to participate in the hematologic care of your patients.    Irving Shows, MD   Department of Medicine  Hematology  [facility]  office [phone]  fax [phone]  01/11/2024    Patient Care Team:  Oralia Rud, DO as PCP - General (Family Medicine)           {NON MALIG HEME MDM (Optional):39646}    I performed this evaluation using real-time telehealth tools, including a live video Zoom connection between my location and the patient's location. Prior to initiating, the patient consented to perform this evaluation using telehealth tools.    {Failed Integrated Video Visit Note (optional):[id number]::" "}

## 2024-01-14 NOTE — Telephone Encounter (Signed)
 Spoke with patient.  She notes her O2 readings are wnl and hse is no longer requiring o2 therapy.  She would like to get the materials picked up from her house.  Called:  [facility], an [employer] company - [place] .    Service: [facility] [employer] [facility] [employer] [facility]  Contact information  [address] [place] [address]  [place] [address]  [phone]      Will fax them a request to [phone] to discontinue services and they will then coordinate with the patient for pick-up.      She restarted ursodiol yesterday and will continue to monitor blood glucose levels.      Dixon Boos, PA-C  [facility]   [phone]

## 2024-01-28 LAB — COMPREHENSIVE METABOLIC PANEL (BMP, AST, ALT, T.BILI, ALKP, TP ALB)
AST: 21 U/L (ref 10–35)
Alanine transaminase: 23 U/L (ref 6–29)
Albumin, Serum / Plasma: 4.6 g/dL (ref 3.6–5.1)
Albumin/Globulin Ratio: 2 (calc) (ref 1.0–2.5)
Alkaline Phosphatase: 48 U/L (ref 37–153)
Bilirubin, Total: 0.3 mg/dL (ref 0.2–1.2)
Calcium, total, Serum / Plasma: 9.8 mg/dL (ref 8.6–10.4)
Carbon Dioxide, Total: 24 mmol/L (ref 20–32)
Chloride, Serum / Plasma: 103 mmol/L (ref 98–110)
Creatinine, Serum / Plasma: 0.67 mg/dL (ref 0.50–1.05)
Globulin, Total: 2.3 g/dL (ref 1.9–3.7)
Glucose: 145 mg/dL — ABNORMAL HIGH (ref 65–99)
Potassium, Serum / Plasma: 4.2 mmol/L (ref 3.5–5.3)
Protein, Total, Serum / Plasma: 6.9 g/dL (ref 6.1–8.1)
Sodium, Serum / Plasma: 142 mmol/L (ref 135–146)
Urea Nitrogen, serum/plasma: 14 mg/dL (ref 7–25)
eGFRcr: 98 mL/min/{1.73_m2} (ref >=60)

## 2024-01-28 LAB — VITAMIN D, 25-HYDROXY: Vitamin D, 25-Hydroxy: 63 ng/mL (ref 30–100)

## 2024-01-28 LAB — COPPER, SERUM/PLASMA: Copper: 101 ug/dL (ref 70–175)

## 2024-01-28 LAB — VITAMIN B1 (THIAMINE), WHOLE BLOOD: VITAMIN B1 (THIAMINE), BLOOD: 150 nmol/L (ref 78–185)

## 2024-01-28 LAB — LIPID PANEL (INCL. LDL, HDL, TOTAL CHOL. AND TRIG.)
Chol HDL Ratio: 3.2 (calc) (ref <5.0)
Cholesterol, HDL: 47 mg/dL — ABNORMAL LOW (ref >=50)
Cholesterol, Total: 150 mg/dL (ref <200)
LDL Cholesterol: 76 mg/dL
LDl/HDL Ratio: 1.6 (calc)
Non HDL Cholesterol: 103 mg/dL (ref <130)
Triglycerides, serum: 172 mg/dL — ABNORMAL HIGH (ref <150)

## 2024-01-28 LAB — PARATHYROID HORMONE WITH CALCIUM
Calcium, total, Serum / Plasma: 9.7 mg/dL (ref 8.6–10.4)
PTH: 31 pg/mL (ref 16–77)

## 2024-01-28 LAB — VITAMIN A: Vitamin A: 55 ug/dL (ref 38–98)

## 2024-01-28 LAB — COMPLETE BLOOD COUNT
Hematocrit: 44.6 % (ref 35.0–45.0)
Hemoglobin: 14.6 g/dL (ref 11.7–15.5)
MCH: 30.7 pg (ref 27.0–33.0)
MCHC: 32.7 g/dL (ref 32.0–36.0)
MCV: 93.9 fL (ref 80.0–100.0)
MPV: 10.5 fL (ref 7.5–12.5)
Platelet Count: 187 10*3/uL (ref 140–400)
RBC Count: 4.75 10*6/uL (ref 3.80–5.10)
RDW-CV: 14.1 % (ref 11.0–15.0)
WBC Count: 5 10*3/uL (ref 3.8–10.8)

## 2024-01-28 LAB — IRON: Iron, serum: 140 ug/dL (ref 45–160)

## 2024-01-28 LAB — SELENIUM, SERUM/PLASMA: Selenium, Serum/Plasma: 129 ug/L (ref 63–160)

## 2024-01-28 LAB — VITAMIN B12 AND FOLATE
Folates, Serum: >24 ng/mL
Vitamin B12: 636 pg/mL (ref 200–1100)

## 2024-01-28 LAB — FERRITIN: Ferritin, Serum/Plasma: 15 ng/mL — ABNORMAL LOW (ref 16–288)

## 2024-01-28 LAB — ZINC, SERUM/PLASMA: Zinc: 83 ug/dL (ref 60–130)

## 2024-01-28 LAB — HEMOGLOBIN A1C: Hemoglobin A1c: 7.6 %{Hb} — ABNORMAL HIGH (ref <5.7)

## 2024-01-28 LAB — ANTI-XA, APIXABAN LEVEL: Apixaban Anti-Xa: 74 ng/mL

## 2024-02-02 NOTE — Progress Notes (Unsigned)
 [facility] Note - Follow-up vs New pt, Video vs F2F Visit ***    CC: ***    ID: Susan Goodwin is a 63 y.o. woman w/ PMHx ***, who is presenting to pulmonary clinic for initial evaluation of asthma, severe OSA (not regularly on CPAP, but has machine), HTN, GERD, and T2DM, obesity (BMI 57), who is now POD4 s/p roux-en-Y gastric bypass, with a persistent oxygen requirement. Pulmonary is consulted regarding new hypoxemia.     HPI:   Pt referred by ***      OSA on CPAP (non-compliant with use at home)  Hypoxic respiratory insufficiency, likely chronic, likely in part due to obesity hypoventilation syndrome, requiring supplemental oxygen  Acute pulmonary edema requiring IV Lasix  Acute subsegmental pulmonary emboli     Pt should be seen by sleep medicine--she may potentially need bilevel (as opposed to CPAP). She should also have outpatient PFTs with spirometry and DLCO. If her DLCO is low, it was recommended that she have a repeat TTE for evaluation of pulmonary hypertension.    Per chart,    Today, 01/31/24, pt reports ***    Review of Systems:   Constitutional: no fever, malaise, wt loss, nightsweats  HEENT: no sore throat, nasal congestion, post nasal drip, vision changes  Cardiovascular: no CP, palpitations, DOE, orthopnea  Respiratory: no SOB, cough, wheeze  Gastrointestinal: no abd pain, n/v/d/c  Musculoskeletal: no muscle/joint pain  Skin: no rashes  Neurological: no HAs, focal numbness/weakness  Psychiatric: no depression  Endocrine: no increased thirst or urination  Hematologic / Lymphatic: no bruises, LAD  Allergic / Immunologic: no seasonal allergies  The remainder of systems were reviewed and were negative.    PMHx:  Past Medical History:   Diagnosis Date    Acid reflux     Asthma     Diabetes mellitus (CMS code)     Essential hypertension     High cholesterol     History of esophagogastroduodenoscopy (EGD)     Knee pain     Low back pain     Obesity     Sleep apnea     Uterine cancer (CMS  code)       PSHx:  Past Surgical History:   Procedure Laterality Date    ANKLE FRACTURE SURGERY      CARPAL TUNNEL RELEASE      FINGER SURGERY      screw placement    HYSTERECTOMY      NASAL FRACTURE SURGERY      SHOULDER SURGERY      TUBAL LIGATION       Meds:  Current Outpatient Medications   Medication Instructions    acetaminophen (TYLENOL) 1,000 mg, Oral, 3 Times Daily Scheduled, Two tablets by mouth three times daily CRUSHED.    albuterol 90 mcg/actuation metered dose inhaler 2 puffs, Every 6 Hours PRN    alcohol swabs swab Use as directed    apixaban (ELIQUIS) 5 mg tablet Take 2 tablets (10 mg total) by mouth 2 (two) times daily for 6 days, THEN 1 tablet (5 mg total) 2 (two) times daily.    atorvastatin (LIPITOR) 20 mg, Daily Scheduled    BD INSULIN SYRINGE 1 mL 29 gauge x 1/2" SYRINGE 2 Times Daily Scheduled    celebrate calcium citrate + vitamin D3 (CELEBRATE) soft chew 1 tablet, Oral, 2 Times Daily With Meals Scheduled    fluticasone propionate (FLONASE) 50 mcg/actuation nasal spray 2 sprays, Each Nostril, 2 Times Daily  Scheduled    gabapentin (NEURONTIN) 600 mg, Oral, Daily At Bedtime Scheduled    glucosamine-chondroitin 500-400 mg capsule Oral, 2 Times Daily Scheduled, .  Temporarily stop taking after surgery. Follow up with your surgeon's office before restarting.    insulin glargine (LANTUS, BASAGLAR, SEMGLEE, REZVOGLAR) 8 Units, Subcutaneous, Daily At Bedtime Scheduled    lactulose (ENULOSE) 10 g, Oral, Daily Scheduled, for constipation. May increase to 60 mL (40 g total ) once daily if necessary    losartan (COZAAR) 25 mg, Oral, Daily Scheduled, . Temporarily stop taking this medication after surgery. Follow up with your surgeon's office regarding when to restart.    metoprolol tartrate (LOPRESSOR) 12.5 mg, Oral, Twice Daily, . REPLACE metoprolol succinate (XL formulation) for 3 months    montelukast (SINGULAIR) 10 mg, Daily Scheduled    multivitamin-min-iron-FA-vit K (PROCARE BARIATRIC) 45 mg iron-  800 mcg-120 mcg capsule 1 capsule, Oral, Daily At Bedtime Scheduled    omeprazole (PRILOSEC) 20 mg, Oral, Daily Scheduled    ONETOUCH VERIO TEST STRIPS test strip TEST BLOOD SUGAR TWO TIMES A DAY. E11.65    oxymetazoline (AFRIN) 0.05 % nasal spray 2 sprays, Each Nostril, Twice Daily    pen needle, diabetic 32 gauge x 5/32" needle Use once daily as directed.    Lobbyist: Use as directed to discard insulin needles and lancets with each use.    sodium chloride (OCEAN) 0.65 % nasal spray 2 sprays, Each Nostril, 3 Times Daily    ursodioL (ACTIGALL) 300 mg, Oral, 2 Times Daily Scheduled     Allergies:  Allergies/Contraindications   Allergen Reactions    Atorvastatin      myalgia    Lovastatin      diarrhea    Lisinopril      bad cough   bad cough    bad cough     FHx:  Family History   Problem Relation Name Age of Onset    Mental health problem Mother          in [facility]     SHx:  Social History     Socioeconomic History    Marital status: Married     Spouse name: Not on file    Number of children: Not on file    Years of education: Not on file    Highest education level: Not on file   Occupational History    Not on file   Tobacco Use    Smoking status: Never    Smokeless tobacco: Never   Substance and Sexual Activity    Alcohol use: Not Currently     Comment: past rare social use; no history of problem use    Drug use: Never     Comment: no history of Rx med abuse or illicit substance use    Sexual activity: Not Currently   Other Topics Concern    Not on file   Social History Narrative    Not on file     Social Drivers of Health     Financial Resource Strain: Medium Risk (04/13/2023)    Overall Financial Resource Strain (CARDIA)     Difficulty of Paying Living Expenses: Somewhat hard   Food Insecurity: Food Insecurity Present (04/13/2023)    Hunger Vital Sign     Worried About Running Out of Food in the Last Year: Often true     Ran Out of Food in the Last Year: Sometimes true    Transportation Needs: No Transportation  Needs (04/13/2023)    PRAPARE - Therapist, art (Medical): No     Lack of Transportation (Non-Medical): No   Housing Stability: High Risk (04/13/2023)    Housing Stability Vital Sign     Unable to Pay for Housing in the Last Year: Yes     Number of Times Moved in the Last Year: Not on file     Homeless in the Last Year: Not on file        HRB:  - Tobacco: none ***  - EtOH: socially ***  - Drug use: none ***    Exposure Hx:  - Residence:  - Recreation:  - Occupation:  - Exposure to toxic chemicals, dust, fumes, or asbestos:  - Exposure to down pillows/comforters, humidifier, air cleaner/purifier, steam sauna/shower, indoor hot tub, swamp cooler, water damage or mold/mildew in the home, and pigeons/parakeets/or other birds: ***    Exam:   There were no vitals taken for this visit. ***   *No vitals for video visit    Gen: ***, sitting in chair, NAD  HEENT: NCAT, EOMI, conjunctivae normal, sclera anicteric, mmm, OP clear  CV: RRR, no m/r/g, intact peripheral pulses  Lungs: NWOB, CTAB, no w/c/r  Abd: Soft, NTND  Ext: WWP, no clubbing, cyanosis, or edema  Neuro: AOx3, grossly non-focal  Skin: No rashes  Psych: Normal mood and affect    Gen: ***, sitting in chair, NAD  HEENT: NCAT, EOMI, conjunctivae normal, sclera anicteric  Lungs: Normal work of breathing, speaking in full sentences  Neuro: A&O x 3, normal speech  Psych: Normal mood and affect    Data:   I have personally reviewed and interpreted the following:    Labs:      Micro:      Imaging:      Cards:      Pulm:      Path:      Assessment/Plan:    63 y.o. woman    Case discussed with attending Dr. Marland Kitchen  RTC in ***    {No telemedicine language required for this visit type. If this is not an in person visit, please have this appointment converted to the appropriate visit type and select one of the following. (optional):[phone]::" "}       Milderd Meager, MD, PhD - PGY6  Pulmonary & Critical Care  Medicine Fellow  01/31/2024

## 2024-02-09 ENCOUNTER — Ambulatory Visit: Payer: Commercial Managed Care - HMO | Attending: Physician Assistant

## 2024-02-09 DIAGNOSIS — Z9884 Bariatric surgery status: Secondary | ICD-10-CM

## 2024-02-09 MED ORDER — INSULIN GLARGINE (U-100) 100 UNIT/ML (3 ML) SUBCUTANEOUS PEN
100 | Freq: Two times a day (BID) | SUBCUTANEOUS | 2 refills | 60.00000 days | Status: DC
Start: 2024-02-09 — End: 2024-10-17

## 2024-02-09 NOTE — Progress Notes (Unsigned)
 {No telemedicine language required for this visit type. If this is not an in person visit, please have this appointment converted to the appropriate visit type and select one of the following. (optional):[phone]::" "}   ***       Subjective    Susan Goodwin is a 63 y.o. year old woman with mild asthma, severe OSA (not regularly on CPAP), HTN, GERD, T2DM, and obesity (BMI 51), recently admitted for roux-en-Y gastric bypass c/b post-op PEs and respiratory failure, referred to Pulmonary clinic for ***.    History of Present Illness   ***    Initial history 02/09/24  Hospitalized in December for roux-en-Y gastric bypass surgery (~12/10), then developed persistent post-op hypoxemia. For this she had a CT-PE which revealed multiple segmental and subsegmental PE, no clear signs of right heart strain. She had multiple ambulatory O2 sats, both technically normal though she did drop from 99% to 93% with walking. Nadir O2 sat was 83%, sustained, so she was discharged with nocturnal oxygen bleed-in to CPAP. She was also treated with IV furosemide.    Exercise tolerance***  ROS - cough, GERD, rhinitis, allergies, sputum production, hemoptysis, shortness of breath, leg swelling, orthopnea/PND, dry eyes, dry mouth, rashes, fevers, weight loss, extreme fatigue    Exposures history ***  Tobacco -   Other inhalations -   Occupation - no known exposure to toxic dusts, gases, vapors and fumes, including asbestos  Home - no ***carpets, down pillows/comforters, humidifier, air cleaner/purifier, steam sauna/shower, indoor hot tub, swamp cooler, water damage or mold/mildew in the home  Pets - no ***birds/pigeons/parakeets      Allergies as of 02/04/2024  Review status set to Review Complete by Joellyn Rued, RN on 11/08/2023        Severity Noted Reaction Type Reactions    Atorvastatin Not Specified 07/31/2021        myalgia    Lovastatin Not Specified 07/31/2021        diarrhea    Lisinopril Low 07/31/2021        bad cough   bad  cough  bad cough             Outpatient Encounter Medications as of 02/09/2024   Medication Sig Dispense Refill    acetaminophen (TYLENOL) 500 mg tablet Take 2 tablets (1,000 mg total) by mouth every morning, afternoon, and evening. Two tablets by mouth three times daily CRUSHED. 100 tablet 0    albuterol 90 mcg/actuation metered dose inhaler Inhale 2 puffs into the lungs every 6 (six) hours as needed for Shortness of Breath      alcohol swabs swab Use as directed 100 each 2    apixaban (ELIQUIS) 5 mg tablet Take 2 tablets (10 mg total) by mouth 2 (two) times daily for 6 days, THEN 1 tablet (5 mg total) 2 (two) times daily. 204 tablet 0    atorvastatin (LIPITOR) 20 mg tablet Take 1 tablet (20 mg total) by mouth daily      BD INSULIN SYRINGE 1 mL 29 gauge x 1/2" SYRINGE 2 (two) times daily      celebrate calcium citrate + vitamin D3 (CELEBRATE) soft chew Chew 1 tablet by mouth in the morning and 1 tablet in the evening. Chew with meals. 90 tablet 11    fluticasone propionate (FLONASE) 50 mcg/actuation nasal spray Use 2 sprays in each nostril 2 (two) times daily      gabapentin (NEURONTIN) 300 mg capsule Take 2 capsules (600 mg total) by  mouth nightly at bedtime 28 capsule 0    glucosamine-chondroitin 500-400 mg capsule Take by mouth 2 (two) times daily .  Temporarily stop taking after surgery. Follow up with your surgeon's office before restarting.      insulin glargine (LANTUS, BASAGLAR, SEMGLEE, REZVOGLAR) 100 unit/mL (3 mL) injection pen Inject 8 Units under the skin nightly at bedtime 15 mL 0    lactulose (ENULOSE) 10 gram/15 mL solution Take 15 mL (10 g total) by mouth daily for constipation. May increase to 60 mL (40 g total ) once daily if necessary 473 mL 1    losartan (COZAAR) 25 mg tablet Take 1 tablet (25 mg total) by mouth daily . Temporarily stop taking this medication after surgery. Follow up with your surgeon's office regarding when to restart.      metoprolol tartrate (LOPRESSOR) 25 mg tablet Take 0.5  tablets (12.5 mg total) by mouth in the morning and 0.5 tablets (12.5 mg total) in the evening. . REPLACE metoprolol succinate (XL formulation) for 3 months. 30 tablet 2    montelukast (SINGULAIR) 10 mg tablet Take 1 tablet (10 mg total) by mouth daily      multivitamin-min-iron-FA-vit K (PROCARE BARIATRIC) 45 mg iron- 800 mcg-120 mcg capsule Take 1 capsule by mouth nightly at bedtime 30 capsule 11    omeprazole (PRILOSEC) 20 mg capsule Take 1 capsule (20 mg total) by mouth daily 30 capsule 5    ONETOUCH VERIO TEST STRIPS test strip TEST BLOOD SUGAR TWO TIMES A DAY. E11.65      oxymetazoline (AFRIN) 0.05 % nasal spray Use 2 sprays in each nostril in the morning and 2 sprays in the evening. 15 mL 0    pen needle, diabetic 32 gauge x 5/32" needle Use once daily as directed. 100 each 3    sharps container Sharps Container: Use as directed to discard insulin needles and lancets with each use. 1 each 0    sodium chloride (OCEAN) 0.65 % nasal spray Use 2 sprays in each nostril every morning, afternoon, and evening. 45 mL 0    ursodioL (ACTIGALL) 300 mg capsule Take 1 capsule (300 mg total) by mouth 2 (two) times daily 60 capsule 5     No facility-administered encounter medications on file as of 02/09/2024.       Past Medical History:   Diagnosis Date    Acid reflux     Asthma     Diabetes mellitus (CMS code)     Essential hypertension     High cholesterol     History of esophagogastroduodenoscopy (EGD)     Knee pain     Low back pain     Obesity     Sleep apnea     Uterine cancer (CMS code)        Past Surgical History:   Procedure Laterality Date    ANKLE FRACTURE SURGERY      CARPAL TUNNEL RELEASE      FINGER SURGERY      screw placement    HYSTERECTOMY      NASAL FRACTURE SURGERY      SHOULDER SURGERY      TUBAL LIGATION         Family History   Problem Relation Name Age of Onset    Mental health problem Mother          in [facility]       Social History     Socioeconomic History    Marital status:  Married    Tobacco Use    Smoking status: Never    Smokeless tobacco: Never   Substance and Sexual Activity    Alcohol use: Not Currently     Comment: past rare social use; no history of problem use    Drug use: Never     Comment: no history of Rx med abuse or illicit substance use    Sexual activity: Not Currently     Social Drivers of Health     Financial Resource Strain: Medium Risk (04/13/2023)    Overall Financial Resource Strain (CARDIA)     Difficulty of Paying Living Expenses: Somewhat hard   Food Insecurity: Food Insecurity Present (04/13/2023)    Hunger Vital Sign     Worried About Running Out of Food in the Last Year: Often true     Ran Out of Food in the Last Year: Sometimes true   Transportation Needs: No Transportation Needs (04/13/2023)    PRAPARE - Therapist, art (Medical): No     Lack of Transportation (Non-Medical): No   Housing Stability: High Risk (04/13/2023)    Housing Stability Vital Sign     Unable to Pay for Housing in the Last Year: Yes        Immunizations  Immunization History   Administered Date(s) Administered    Pfizer Covid Vaccine [employer] Top) 06/08/2021    Pfizer Sars-Cov-2 Vaccine (Purple Top) 10/01/2020, 10/22/2020                Objective            Physical Exam  General - well appearing, not in distress  HEENT - neck supple, sclera anicteric  CV - normal rate, regular rhythm, no murmurs appreciated. JVP***, no peripheral edema  Pulm - normal work of breathing on room air, clear bilaterally  Abdomen - nondistended, soft, nontender to palpation  Extremities - warm and well perfused, no cyanosis or clubbing  Skin - no concerning rashes on visible skin  Neuro - A&Ox4, grossly non-focal  Psych - euthymic      Review of Prior Testing  Labs  ***  Lab Results   Component Value Date    WBC Count 5.0 01/24/2024    Hemoglobin 14.6 01/24/2024    Hematocrit 44.6 01/24/2024    MCV 93.9 01/24/2024    Platelet Count 187 01/24/2024    Thyroid Stimulating Hormone 2.61 09/22/2023     BNP 12 11/07/2023     Lab Results   Component Value Date    NA 142 01/24/2024    K 4.2 01/24/2024    CL 103 01/24/2024    CO2 24 01/24/2024    BUN 14 01/24/2024    CREAT 0.67 01/24/2024    GLU 145 (H) 01/24/2024     No results found for: "EOA"  Carbon Dioxide, Total   Date Value Ref Range Status   01/24/2024 24 20 - 32 mmol/L Final   11/09/2023 27 22 - 29 mmol/L Final   11/08/2023 28 22 - 29 mmol/L Final       No results found for: "ANA", "ANAP", "ANAT", "ANAEXL", "ANATSIEXQ", "ANAPSEXQ", "CYCP", "CCPABEXL", "CCPABIGGEXQ", "RF", "RFT", "RFEXL", "SM", "SMRNPEXQ", "RNP", "RNPEXL", "RNPABEXQ", "RNPIGG", "RNPQ", "DSDNA", "DSDNAEXQ", "SSA", "SSAEXQ", "SSB", "SSBEXQ", "AENA", "AENAEXQ", "AP3A", "AP3AEXQ", "AMYE", "AMYEEXQ", "SCL70I", "SCL70", "SCL7", "AGBM"  No results found for: "MYS1", "MYS1A", "MYS2", "JO1", "JO1AB", "MYS3", "MYS4", "MYS5", "MYS6", "MYS7", "MYS8", "MYS9", "MYS10", "MYS11", "MYS12", "MYS13", "MYS14", "MYS15", "MYS16", "MYS17", "MYS18", "MYS19"  Lab  Results   Component Value Date    Ferritin, Serum/Plasma 15 (L) 01/24/2024     No results found for: "IGM", "IGA", "IGE"     Autoimmune  ***    Micro  ***    CT chest without contrast ***  ***    Pulmonary function tests ***  ***    Sleep study 08/09/13 [facility] AMBULATORY SLEEP STUDY)  Diagnosis: SEVERE OSA (AHI= 36; ODI = 29, Min O2 sat 81%; Mean O2 sat =91%; Duration o2 Sat <90% = 73 minutes)  ESS = 14    Walk tests  Ambulatory O2 study on 12/17, maintained O2 sats between 94-96%.   Repeat ambulatory O2 studies on day of discharge (11/10/23): SpO2 on room air at rest of 99%, SpO2 with ambulation on RA waas 93% with 2L NC improves to 99%. SpO2 83% while sleeping on with 3LNC in place 12/17-12/18, longer than 5 mins total. Her oxygen was increased to [facility] with sats remaining above 92% thereafter. Susan Goodwin with discharged home with nocturnal supplemental O2, 4L via NC to her CPAP device.     TTE 11/08/23  Conclusions:  Due to technical issues, the quality  of the study was limited. The patient's blood pressure was 113 mmHg/103 mmHg during the study. The heart rate during the study was 81. Color Flow Doppler was utilized for this exam. Spectral Doppler was utilized for   this exam.  1. The left ventricular volume is normal. LV function is hyperdynamic. 2D LV ejection fraction is estimated to be 70 to 75%. There is no left ventricular hypertrophy. No segmental wall motion abnormalities present.  2. The right ventricular volume is mildly increased. The right ventricle is not well seen but probably normal in function.  3. Left atrial size is normal. The right atrium is not well seen.  4. There is no hemodynamically significant valvular disease.  5. Diastolic function is indeterminate.  6. The pulmonary artery systolic pressure cannot be determined due to the lack of a complete TR jet.  7. No pericardial effusion noted. The IVC is not well seen and RA pressure cannot be estimated.  8. Aortic root dimension is normal.  9. No evidence of early or late shunting was seen with right sided saline contrast administration.    Pharmacologic stress test 09/22/23  1.  Negative study for ischemia or scar.  2.  Overall gated left ventricular systolic function was normal with a calculated LVEF of 66% at stress and 63% at rest.  3.  No left ventricular dilatation.          Assessment and Plan    Susan Goodwin is a 63 y.o. year old woman with ***.     {Assessment and plan options:28234}     # Severe OSA  # Nocturnal hypoxemia  -     # Pulmonary emboli  Provoked in the post-op setting, with a relative increase in risk from elevated BMI. From my perspective, would recommend 3 months of anticoagulation but she has been referred to Hematology for this question.     # Pulmonary-related vaccinations/immunizations  Immunization status: {immuniz status:315306}.  Please maintain the following recommendations:  - Flu: 1 dose annually  - COVID booster : please make sure receive 2023-2024  booster  - RSV: if greater than >60 or currently pregnant  - Pneumonia vaccine:     Preferred PCV20 if no prior vaccine     1 dose of either PCV13 OR PPSV23: give PCV20 at least 1  year after last dose     1 dose of either PCV13 AND PPSV23: give PCV20 5 years after last dose     If given 1 dose of PCV15: give PPSV23 at least 1 year after last dose             Follow-up plan  Return to clinic in {Time:19197::"*** months","1 year","PRN; discharge from clinic, please do not hesitate to contact our clinic if any questions arise"}.   Plan discussed with clinic attending Dr. Skipper Cliche attending:[id number]::"David Claman","Yaron Gesthalter","Nisha Gidwani","Robert Grant","Katherine Malcolm","Eric Seeley","***"}.       Bjorn Loser, MD, PhD  Fellow, Pulmonary & Critical Care Medicine    {Complexity of data reviewed (optional):28233::" "}

## 2024-02-09 NOTE — Progress Notes (Signed)
 I performed this consultation using real-time Telehealth tools, including a live video connection between my location and the patient's location. Prior to initiating the consultation, I obtained informed verbal consent to perform this consultation using Telehealth tools and answered all the questions about the Telehealth interaction.    HPI  I saw Susan Goodwin today in the [facility]. This is her 3 month follow up.  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    She is enjoying a wide range of food types without significant nausea, vomiting, or dysphagia.  She has had no clinical symptoms of intestinal obstruction or marginal ulceration.  The patient has not had any readmissions, reoperations, or interventions related to their bariatric surgery since their last office visit.  She is taking a daily bariatric multivitamin:  Yes.  She is taking a daily calcium + vitamin D supplement:  Yes.    Saw hematology 01/11/24.  Planning to continue anti-coag till at least 6 months out from surgery.      She has a visit with a local pulmonologist 02/16/24.  She had been referred to [facility] pulmonology, but there has been trouble with getting auth from insurance for the visit.      She is out of refills of her lantus.  She is doing 12-14U qam, 14U qhs.  Her lowest blood glucose reading has been 112.  Usually blood glucose level around 140.  Eating a lot of fruit (blueberries, kiwis, strawberries).    She notes episodes of low blood pressure. Taking metoprolol and losartan.  Has a follow-up with primary care office at the end of the month.      Weight Loss and Diet  Today's (!) 118.8 kg (262 lb), giving a calculated body-mass index of Body mass index is 47.92 kg/m. her preoperative weight was 318lbs pounds. Her goal weight is 230 pounds.     Her weight history is as follows:   Wt Readings from Last 10 Encounters:   02/09/24 (!) 118.8 kg (262 lb)    12/22/23 (!) 126.6 kg (279 lb)   11/19/23 (!) 133.8 kg (295 lb)   11/08/23 (!) 134.8 kg (297 lb 1.6 oz)   10/13/23 (!) 141.5 kg (312 lb)   09/30/23 (!) 141.5 kg (312 lb)   09/14/23 (!) 143.8 kg (317 lb)   08/16/23 (!) 141.1 kg (311 lb)   07/19/23 (!) 141.1 kg (311 lb)   06/07/23 (!) 142.4 kg (314 lb)     Obesity-related Co-morbidity Resolution  Before bariatric surgery, her obesity-related co-morbidities were:  Diabetes - Remains Unchanged  Hypertension - Improved  Hyperlipidemia - Improved  Obstructive Sleep Apnea - Improved  OSA on CPAP -- No  GERD - Improved    Nutritional and Metabolic Laboratory Assessment  I reviewed the patient's nutritional and metabolic labs today:  Hemoglobin A1C 7.6, ferritin 15, otherwise labs wnl     Medications and Vitamins  I reviewed and changed her medications and vitamins supplements today as needed, which are:  Current Medications         Dosage    acetaminophen (TYLENOL) 500 mg tablet Take 2 tablets (1,000 mg total) by mouth every morning, afternoon, and evening. Two tablets by mouth three times daily CRUSHED.    albuterol 90 mcg/actuation metered dose inhaler Inhale 2 puffs into the lungs every 6 (six) hours as needed for Shortness of Breath    alcohol swabs swab Use as directed  apixaban (ELIQUIS) 5 mg tablet Take 2 tablets (10 mg total) by mouth 2 (two) times daily for 6 days, THEN 1 tablet (5 mg total) 2 (two) times daily.    atorvastatin (LIPITOR) 20 mg tablet Take 1 tablet (20 mg total) by mouth daily    BD INSULIN SYRINGE 1 mL 29 gauge x 1/2" SYRINGE 2 (two) times daily    celebrate calcium citrate + vitamin D3 (CELEBRATE) soft chew Chew 1 tablet by mouth in the morning and 1 tablet in the evening. Chew with meals.    fluticasone propionate (FLONASE) 50 mcg/actuation nasal spray Use 2 sprays in each nostril 2 (two) times daily    gabapentin (NEURONTIN) 300 mg capsule Take 2 capsules (600 mg total) by mouth nightly at bedtime    glucosamine-chondroitin 500-400 mg  capsule Take by mouth 2 (two) times daily .  Temporarily stop taking after surgery. Follow up with your surgeon's office before restarting.    insulin glargine (LANTUS, BASAGLAR, SEMGLEE, REZVOGLAR) 100 unit/mL (3 mL) injection pen Inject 8 Units under the skin nightly at bedtime    lactulose (ENULOSE) 10 gram/15 mL solution Take 15 mL (10 g total) by mouth daily for constipation. May increase to 60 mL (40 g total ) once daily if necessary    losartan (COZAAR) 25 mg tablet Take 1 tablet (25 mg total) by mouth daily . Temporarily stop taking this medication after surgery. Follow up with your surgeon's office regarding when to restart.    metoprolol tartrate (LOPRESSOR) 25 mg tablet Take 0.5 tablets (12.5 mg total) by mouth in the morning and 0.5 tablets (12.5 mg total) in the evening. . REPLACE metoprolol succinate (XL formulation) for 3 months.    montelukast (SINGULAIR) 10 mg tablet Take 1 tablet (10 mg total) by mouth daily    multivitamin-min-iron-FA-vit K (PROCARE BARIATRIC) 45 mg iron- 800 mcg-120 mcg capsule Take 1 capsule by mouth nightly at bedtime    omeprazole (PRILOSEC) 20 mg capsule Take 1 capsule (20 mg total) by mouth daily    ONETOUCH VERIO TEST STRIPS test strip TEST BLOOD SUGAR TWO TIMES A DAY. E11.65    oxymetazoline (AFRIN) 0.05 % nasal spray Use 2 sprays in each nostril in the morning and 2 sprays in the evening.    pen needle, diabetic 32 gauge x 5/32" needle Use once daily as directed.    Lobbyist: Use as directed to discard insulin needles and lancets with each use.    sodium chloride (OCEAN) 0.65 % nasal spray Use 2 sprays in each nostril every morning, afternoon, and evening.    ursodioL (ACTIGALL) 300 mg capsule Take 1 capsule (300 mg total) by mouth 2 (two) times daily          Physical Exam   Vitals: Ht 157.5 cm (5\' 2" )   Wt (!) 118.8 kg (262 lb)   BMI 47.92 kg/m   Constitutional: oriented to person, place, and time.    Assessment and Plan  This is a 63 y.o.  female doing well after bariatric surgery.  I would like to see the patient again in 3 months; discussed this visit will be with my colleague, Myriam Jacobson, as I will be out on maternity leave.  I'll plan to see her for follow-up again at 9 mo post-op.  Standing orders are already in place at [facility].  I will keep you apprised of her progress.    Recommends she follow-up with primary care provider about her low blood pressure  to see if med doses need to be reduced.  Discussed ok to go back to metoprolol XL at this point as she doesn't need to break up meds.  I sent insulin refills to her pharmacy so she doesn't run out, but recommend she get further refills from her primary care.      Recommend she trial holding fruit intake for a week to see how that impacts her blood glucose levels.  Not sure if the natural sugar in the fruit is contributing to hyperglycemia.      She will continue to follow with hematology regarding anti-coagulation recommendations.         Goals after bariatric surgery:  - Patients should strive for a healthy BMI with as best resolution / prevention of obesity related comorbid diseases as possible.  ?  Daily Water Requirements:  - Daily water goals for the standard patient including 64 oz minimum water intake (1-2 Liters) some medical conditions may require careful balancing of water intake to avoid health complications, please refer to your specialists as needed to adjust water intake adequately to maintain hydration but minimize risk of exacerbation of chronic disease.  ?  Daily Multi Vitamins:  - It is very important to take a daily high potency multi vitamin for life. The [facility] team recommends the Bariatric Pro Care Multi Vitamin with 45mg  of iron (chewable or capsule) once daily (capsules can be swallowed whole).  - This is available only online without need of a prescription at [email or web address]  ?  Daily Calcium Supplementation:  - You should take 1200-1500 mg daily of calcium  citrate for life. It is best to take your calcium supplementation in small doses throughout the day for best absorption. There are many forms available including: tablet, chewable, soft chew, gummies etc. It is recommended that patients find a calcium citrate brand that they enjoy, that is calorie free or very low calorie. These are available at any pharmacy or online without the need of a prescription. We recommend trying the Celebrate brand Calcium citrate soft chews available at [email or web address]  ?  Daily Protein Requirements:  - Goal should be protein intake between 60-80 grams per day. Some options to help you reach your daily goals including protein shakes or powders, lean meats ([place], fish, chicken), tofu, etc. In addition you will need green vegetable intake to maintain adequate nutrition and you should avoid sugars, carbohydrates, starches, pasta, rice, bread, and potatoes.  ?  Calorie Goals - Weight Loss Phase:  - Daily Calorie Goal of 500-700 total per day, focusing on lean proteins and leafy green vegetables during the weight loss phase or until you have reached your desired goal weight.  ?  Calorie Goals - Weight Maintenance Phase:  - Daily Calorie Goal of around 1000 per day (+/- 100-200 calories) once you have achieved your weight loss goal. You should continue to count and log calories for life even when at goal weight to maintain desired weight. You may need to adjust your calories up or down to maintain their weight at goal over time.  ?  Medications:  - After gastric bypass, patients should never take NSAIDS (Asprin, Advil, Aleve, Naproxen, Motrin, and Ibuprofen, etc) because of the risk of marginal ulceration.  After sleeve gastrectomy, NSAIDS can be taken after healing has occurred, typically 3-6 months after surgery.  ?  Smoking cigarettes should never occur after bariatric surgery. Minimal alcohol intake is acceptable 3 months after surgery, but any alcohol (wine or beer)  intake should  be limited to 2oz. / 8 hours, as the effect of alcohol will be more pronounced.   ?  We recommend that you continue to carefully count and restrict calories according to the postoperative bariatric surgery protocol and your nutritionist-specific recommendations. Caloric intake should be recorded separately either online or in a bariatric surgery journal for daily review. Daily calorie counting is imperative for successful and long-term weight loss, and can employ the following tips:  - Identify a set time and place to count calories - make this process a daily habit.   - Using calorie counting aids (web-based: www.fitday.com) can help.   - Use a personal food space at home to help concentrate on food types that others in the household may not be eating.  - Advance meal planning using a daily / weekly spreadsheet.   - Time your meals to less than 10 minutes per meal (eg. use an egg timer if necessary)     Also, attendance at a support group is highly encouraged. The [facility] Support Group meets the 3rd Wednesday of every month at 5pm - the sessions are currently being held virtually due to COVID-19.  Please join our email list to receive the Zoom information each month. Patients should feel free to join this group any time and bring friends or family members. To add an email to the support group mailing list, send request to: [email or web address].     I, Festus Barren, PA-C, spent a total of 42 minutes on this patient's care on the day of their visit excluding time spent related to any billed procedures. This time includes time spent with the patient as well as time spent documenting in the medical record, reviewing patient's records and tests, obtaining history, placing orders, communicating with other healthcare professionals, counseling the patient, family, or caregiver, and/or care coordination for the diagnoses above.    APP Visit Information:   APP Service Type:   Independent  Available MD consultant:  Alroy Dust, MD         Dixon Boos, PA-C  [facility]: Bariatrics   [phone]

## 2024-05-03 LAB — COMPLETE BLOOD COUNT
Hematocrit: 44.5 % (ref 35.0–45.0)
Hemoglobin: 14.8 g/dL (ref 11.7–15.5)
MCH: 32.5 pg (ref 27.0–33.0)
MCHC: 33.3 g/dL (ref 32.0–36.0)
MCV: 97.8 fL (ref 80.0–100.0)
MPV: 11.1 fL (ref 7.5–12.5)
Platelet Count: 179 10*3/uL (ref 140–400)
RBC Count: 4.55 10*6/uL (ref 3.80–5.10)
RDW-CV: 13.5 % (ref 11.0–15.0)
WBC Count: 5 10*3/uL (ref 3.8–10.8)

## 2024-05-03 LAB — COMPREHENSIVE METABOLIC PANEL
AST: 21 U/L (ref 10–35)
Alanine transaminase: 23 U/L (ref 6–29)
Albumin, Serum / Plasma: 4.4 g/dL (ref 3.6–5.1)
Albumin/Globulin Ratio: 1.9 (calc) (ref 1.0–2.5)
Alkaline Phosphatase: 57 U/L (ref 37–153)
Bilirubin, Total: 0.4 mg/dL (ref 0.2–1.2)
Calcium, total, Serum / Plasma: 9.4 mg/dL (ref 8.6–10.4)
Carbon Dioxide, Total: 27 mmol/L (ref 20–32)
Chloride, Serum / Plasma: 105 mmol/L (ref 98–110)
Creatinine, Serum / Plasma: 0.72 mg/dL (ref 0.50–1.05)
Globulin, Total: 2.3 g/dL (ref 1.9–3.7)
Glucose: 112 mg/dL — ABNORMAL HIGH (ref 65–99)
Potassium, Serum / Plasma: 4.3 mmol/L (ref 3.5–5.3)
Protein, Total, Serum / Plasma: 6.7 g/dL (ref 6.1–8.1)
Sodium, Serum / Plasma: 141 mmol/L (ref 135–146)
Urea Nitrogen, serum/plasma: 14 mg/dL (ref 7–25)
eGFRcr: 94 mL/min/{1.73_m2} (ref >=60)

## 2024-05-03 LAB — VITAMIN A: Vitamin A: 54 ug/dL (ref 38–98)

## 2024-05-03 LAB — ZINC, SERUM / PLASMA: Zinc: 75 ug/dL (ref 60–130)

## 2024-05-03 LAB — PARATHYROID HORMONE WITH CALCIUM
Calcium, total, Serum / Plasma: 9.5 mg/dL (ref 8.6–10.4)
PTH: 33 pg/mL (ref 16–77)

## 2024-05-03 LAB — SELENIUM, SERUM/PLASMA: Selenium, Serum/Plasma: 156 ug/L (ref 63–160)

## 2024-05-03 LAB — VITAMIN B12 AND FOLATE
Folates, Serum: >24 ng/mL
Vitamin B12: 632 pg/mL (ref 200–1100)

## 2024-05-03 LAB — LIPID PANEL
Chol HDL Ratio: 3.5 (calc) (ref <5.0)
Cholesterol, HDL: 44 mg/dL — ABNORMAL LOW (ref >=50)
Cholesterol, Total: 156 mg/dL (ref <200)
LDL Cholesterol: 90 mg/dL
LDl/HDL Ratio: 2 (calc)
Non HDL Cholesterol: 112 mg/dL (ref <130)
Triglycerides, serum: 119 mg/dL (ref <150)

## 2024-05-03 LAB — VITAMIN B1 (THIAMINE), WHOLE BLOOD: VITAMIN B1 (THIAMINE), BLOOD: 148 nmol/L (ref 78–185)

## 2024-05-03 LAB — FERRITIN: Ferritin, Serum/Plasma: 12 ng/mL — ABNORMAL LOW (ref 16–288)

## 2024-05-03 LAB — IRON: Iron, serum: 159 ug/dL (ref 45–160)

## 2024-05-03 LAB — COPPER, SERUM / PLASMA: Copper: 105 ug/dL (ref 70–175)

## 2024-05-03 LAB — HEMOGLOBIN A1C: Hemoglobin A1c: 6.8 % — ABNORMAL HIGH (ref <5.7)

## 2024-05-03 LAB — VITAMIN D, 25-DIHYDROXY: Vitamin D, 25-Hydroxy: 68 ng/mL (ref 30–100)

## 2024-05-08 ENCOUNTER — Ambulatory Visit: Payer: Medicare HMO | Attending: Physician Assistant

## 2024-05-08 DIAGNOSIS — Z9884 Bariatric surgery status: Secondary | ICD-10-CM

## 2024-05-08 MED ORDER — METFORMIN 1,000 MG TABLET
1000 | Freq: Two times a day (BID) | ORAL | 3.00 refills | 30.00 days | Status: AC
Start: 2024-05-08 — End: ?

## 2024-05-08 NOTE — Progress Notes (Signed)
 I performed this consultation using real-time Telehealth tools, including a live video connection between my location and the patient's location. Prior to initiating the consultation, I obtained informed verbal consent to perform this consultation using Telehealth tools and answered all the questions about the Telehealth interaction.    HPI  I saw Susan Goodwin today in the [facility]. This is her 6 month follow up.  She had the following procedure(s) done on 11/02/2023: laparoscopic roux-en-y gastric bypass with Dr. Hiram Lukes.    She is enjoying a wide range of food types without significant nausea, vomiting, or dysphagia.  She has had no clinical symptoms of intestinal obstruction or marginal ulceration.  The patient has not had any readmissions, reoperations, or interventions related to their bariatric surgery since their last office visit.  She is taking a daily bariatric multivitamin:  Yes.  She is taking a daily calcium  + vitamin D supplement:  Yes.    Diet-wise, tries to 60g of protein a day. Avoiding bread, rice, pasta, potatoes, tortillas.    Hydration-wise, drinks 3 water  bottles a day.     For physical activity, able to move more down.    Planning on getting shoulder surgery in July.    Fasting sugars 112 today. Home SBP readings 113-115.    Reflux triggered by spicy foods.    Weight Loss and Diet  Today's (!) 110.7 kg (244 lb), giving a calculated body-mass index of Body mass index is 44.63 kg/m. her preoperative weight was 318 pounds. Her goal weight is 160 pounds.     Her weight history is as follows:   Wt Readings from Last 10 Encounters:   05/08/24 (!) 110.7 kg (244 lb)   02/09/24 (!) 118.8 kg (262 lb)   12/22/23 (!) 126.6 kg (279 lb)   11/19/23 (!) 133.8 kg (295 lb)   11/08/23 (!) 134.8 kg (297 lb 1.6 oz)   10/13/23 (!) 141.5 kg (312 lb)   09/30/23 (!) 141.5 kg (312 lb)   09/14/23 (!) 143.8 kg (317 lb)   08/16/23 (!) 141.1 kg (311 lb)   07/19/23 (!) 141.1 kg (311 lb)      Obesity-related Co-morbidity Resolution  Before bariatric surgery, her obesity-related co-morbidities were:  Diabetes - Improved  Hypertension - Improved  Hyperlipidemia - Remains Unchanged  Obstructive Sleep Apnea - Improved  OSA on CPAP -- No  GERD - Remains Unchanged    Nutritional and Metabolic Laboratory Assessment  I reviewed the patient's nutritional and metabolic labs today from 04/28/24:  Glu 122  A1c 6.8  Ferritin 12    Medications and Vitamins  I reviewed and changed her medications and vitamins supplements today as needed, which are:  Current Medications         Dosage    celebrate calcium  citrate + vitamin D3 (CELEBRATE) soft chew Chew 1 tablet by mouth in the morning and 1 tablet in the evening. Chew with meals.    insulin  glargine (LANTUS , BASAGLAR , SEMGLEE , REZVOGLAR ) 100 unit/mL (3 mL) injection pen Inject 14 Units under the skin in the morning and at bedtime    metFORMIN  (GLUCOPHAGE ) 1,000 mg tablet Take 1 tablet (1,000 mg total) by mouth 2 (two) times daily    multivitamin-min-iron-FA-vit K (PROCARE BARIATRIC) 45 mg iron- 800 mcg-120 mcg capsule Take 1 capsule by mouth nightly at bedtime    omeprazole  (PRILOSEC) 20 mg capsule Take 1 capsule (20 mg total) by mouth daily    albuterol  90 mcg/actuation metered dose inhaler Inhale  2 puffs into the lungs every 6 (six) hours as needed for Shortness of Breath    alcohol swabs swab Use as directed    atorvastatin  (LIPITOR ) 20 mg tablet Take 1 tablet (20 mg total) by mouth daily    BD INSULIN  SYRINGE 1 mL 29 gauge x 1/2" SYRINGE 2 (two) times daily    fluticasone  propionate (FLONASE ) 50 mcg/actuation nasal spray Use 2 sprays in each nostril 2 (two) times daily    glucosamine-chondroitin 500-400 mg capsule Take by mouth 2 (two) times daily .  Temporarily stop taking after surgery. Follow up with your surgeon's office before restarting.    losartan  (COZAAR ) 25 mg tablet Take 1 tablet (25 mg total) by mouth daily . Temporarily stop taking this medication  after surgery. Follow up with your surgeon's office regarding when to restart.    metoprolol  tartrate (LOPRESSOR ) 25 mg tablet Take 0.5 tablets (12.5 mg total) by mouth in the morning and 0.5 tablets (12.5 mg total) in the evening. . REPLACE metoprolol  succinate (XL formulation) for 3 months.    montelukast  (SINGULAIR ) 10 mg tablet Take 1 tablet (10 mg total) by mouth daily    ONETOUCH VERIO TEST STRIPS test strip TEST BLOOD SUGAR TWO TIMES A DAY. E11.65    oxymetazoline (AFRIN) 0.05 % nasal spray Use 2 sprays in each nostril in the morning and 2 sprays in the evening.    pen needle, diabetic 32 gauge x 5/32" needle Use once daily as directed.    Lobbyist: Use as directed to discard insulin  needles and lancets with each use.    sodium chloride  (OCEAN) 0.65 % nasal spray Use 2 sprays in each nostril every morning, afternoon, and evening.            Physical Exam   Vitals: Wt (!) 110.7 kg (244 lb)   BMI 44.63 kg/m   Constitutional: oriented to person, place, and time.    Assessment and Plan  This is a 63 y.o. female doing well after bariatric surgery. I will keep you apprised of her progress.    1. Status post gastric bypass for obesity  - I have given the patient a list of laboratory work to be done beforehand which can be completed at [facility].   - Increase water  intake - goal is 64oz a day.  - Encouraged patient to prioritize high-quality protein in diet to reach 60g a day.   - Minimize carbohydrates like bread, rice, pasta.  - Avoid NSAIDs to minimize risk of ulcers  - I would like to see the patient again in 6 months.  - Can take TUMS for reflux prn; contact us  if heartburn symptoms persist    2. Low ferritin  - Continue bariatric multivitamin with added iron; Referred to hematology for iron infusions    3. Uncontrolled type 2 diabetes mellitus with hyperglycemia (CMS code)  - A1c much improved 6.8 down from 7.6 01/24/24  - follow up with PCP regarding adjusting medications with improved A1c  and weight loss    4. Hypertension, unspecified type  - Continue to check BP at home and follow up with PCP regarding adjusting medications with weight loss    Goals after bariatric surgery:  - Patients should strive for a healthy BMI with as best resolution / prevention of obesity related comorbid diseases as possible.  ?  Daily Water  Requirements:  - Daily water  goals for the standard patient including 64 oz minimum water  intake (1-2 Liters) some medical  conditions may require careful balancing of water  intake to avoid health complications, please refer to your specialists as needed to adjust water  intake adequately to maintain hydration but minimize risk of exacerbation of chronic disease.  ?  Daily Multi Vitamins:  - It is very important to take a daily high potency multi vitamin for life. The [facility] team recommends the Bariatric Pro Care Multi Vitamin with 45mg  of iron (chewable or capsule) once daily (capsules can be swallowed whole).  - This is available only online without need of a prescription at [email or web address]  ?  Daily Calcium  Supplementation:  - You should take 1200-1500 mg daily of calcium  citrate for life. It is best to take your calcium  supplementation in small doses throughout the day for best absorption. There are many forms available including: tablet, chewable, soft chew, gummies etc. It is recommended that patients find a calcium  citrate brand that they enjoy, that is calorie free or very low calorie. These are available at any pharmacy or online without the need of a prescription. We recommend trying the Celebrate brand Calcium  citrate soft chews available at [email or web address]  ?  Daily Protein Requirements:  - Goal should be protein intake between 60-80 grams per day. Some options to help you reach your daily goals including protein shakes or powders, lean meats ([place], fish, chicken), tofu, etc. In addition you will need green vegetable intake to maintain adequate  nutrition and you should avoid sugars, carbohydrates, starches, pasta, rice, bread, and potatoes.  ?  Calorie Goals - Weight Loss Phase:  - Daily Calorie Goal of 500-700 total per day, focusing on lean proteins and leafy green vegetables during the weight loss phase or until you have reached your desired goal weight.  ?  Calorie Goals - Weight Maintenance Phase:  - Daily Calorie Goal of around 1000 per day (+/- 100-200 calories) once you have achieved your weight loss goal. You should continue to count and log calories for life even when at goal weight to maintain desired weight. You may need to adjust your calories up or down to maintain their weight at goal over time.  ?  Medications:  - Please check with your PCP or specialists for medication adjustments and refills as needed after bariatric surgery. The only medications that you should NOT take for life after bariatric surgery are the Non Steroidal Anti Inflammatory Drugs (NSAIDs). These include Asprin (unless prescribed by a cardiologist), Advil , Aleve, Naproxen, Motrin , and Ibuprofen , etc.  ?  Smoking cigarettes should never occur after bariatric surgery. Minimal alcohol intake is acceptable 3 months after surgery, but any alcohol (wine or beer) intake should be limited to 2oz. / 8 hours, as the effect of alcohol will be more pronounced.   ?  We recommend that you continue to carefully count and restrict calories according to the postoperative bariatric surgery protocol and your nutritionist-specific recommendations. Caloric intake should be recorded separately either online or in a bariatric surgery journal for daily review. Daily calorie counting is imperative for successful and long-term weight loss, and can employ the following tips:  - Identify a set time and place to count calories - make this process a daily habit.   - Using calorie counting aids (web-based: www.fitday.com) can help.   - Use a personal food space at home to help concentrate on food  types that others in the household may not be eating.  - Advance meal planning using a daily / weekly spreadsheet.   - Time your meals  to less than 10 minutes per meal (eg. use an egg timer if necessary)     Also, attendance at a support group is highly encouraged. The [facility] Support Group meets the 3rd Wednesday of every month at 5pm - the sessions are currently being held virtually due to COVID-19.  Please join our email list to receive the Zoom information each month. Patients should feel free to join this group any time and bring friends or family members. To add an email to the support group mailing list, send request to: [email or web address].     APP Visit Information:   APP Service Type:  Independent  Available MD consultant:  Baron Border, MD  I attest that I have verbally informed the patient that I am an Advanced Practice Provider and explained my role in their team-based care.    I spent a total of 30 non-overlapping minutes on this patient's care on the day of their visit excluding time spent related to any billed procedures. This time includes time spent with the patient as well as time spent documenting in the medical record, reviewing patient's records and tests, obtaining history, placing orders, communicating with other healthcare professionals, counseling the patient, family, or caregiver, and/or care coordination for the diagnoses above.        Mira Amend, PA-C  [facility]: Bariatrics

## 2024-05-16 ENCOUNTER — Telehealth
Admit: 2024-05-16 | Discharge: 2024-05-16 | Payer: Medicare HMO | Attending: Student in an Organized Health Care Education/Training Program

## 2024-05-16 DIAGNOSIS — E611 Iron deficiency: Secondary | ICD-10-CM

## 2024-05-16 NOTE — Patient Instructions (Addendum)
 Nice to see you today  Please get labs at [facility] as soon as you can. If you notice dizziness, difficulty breathing, or significant blood in your stool, please go to the emergency department right away   To schedule the ultrasound of your heart (echo), please call: [phone], Option 1  I've ordered an IV iron infusion - our clinic will reach out to you to schedule  I placed a referral to see the lung specialists. If you don't hear from them within 1 week, please call [phone]   It's important to make sure you are up to date with your mammogram - please check in with your primary care doctor about this   Follow up in 1 month to check in

## 2024-05-16 NOTE — Progress Notes (Cosign Needed)
 [facility]   Today 05/16/2024 I evaluated Susan Goodwin (DOB 1961-05-21  MRN [id number]) referred by Franky Priest, DO for PE. I reviewed her medical data and summarize them here.       History of Present Illness   63 y.o. female with PMH HTN, asthma, OSA (untreated), IDDM, and severe obesity. 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     11/02/23 - Lap roux-en-y gastric bypass, she was maintained on SQH VTE ppx  11/06/23 - CTA PE : "Several scattered subsegmental pulmonary emboli as described above. Enlarged pulmonary artery disproportionate to the size and number of pulmonary emboli. "  - started on heparin  gtt.   11/07/23 - BLE U/S: Limited by nonvisualization of the right peroneal vein due to poor acoustic attenuation. Within this limit, negative examination for deep venous thrombosis in the bilateral lower extremities from the common femoral vein through the ankle.     Discharged on eliquis  5mg  BID.    Doing well without bleeding, no blood in urine, stool. Denies epistaxis. Stool is very dark in setting of PO iron supplement.    Feeling under the weather for last week with URI symptoms.    No prior history of VTE. Follows with vascular surgery.    Reports breathing has been ok. Can walk 1-3 stairs at a time, but this is due to knee and back pain. Eventually planned for knee replacement.    Fam Hx:  - father died at age 46, no hx of VTE  - mother - still alive. Age 78. No hx of VTE  - 3 siblings, no hx of VTE  - 3 kids, all healthy     Social hx:  From [place]  Lives in [place]    05/16/24 VISIT:  - feeling tired, noticing hair loss  - off the eliquis  for last 1-2 months as she ran out   - moving around more, denies shortness of breath  - sometimes feels too full to take all her medications, is not sure which she is missing  - for last 2 weeks reports new dark, tarry stool     Latest Reference Range & Units 01/24/24 08:02 04/28/24 07:50   WBC Count 3.8 - 10.8 Thousand/uL 5.0 5.0   RBC Count  3.80 - 5.10 Million/uL 4.75 4.55   Hemoglobin 11.7 - 15.5 g/dL 85.3 85.1   Hematocrit 35.0 - 45.0 % 44.6 44.5   Platelet Count 140 - 400 Thousand/uL 187 179   MPV 7.5 - 12.5 fL 10.5 11.1   MCV 80.0 - 100.0 fL 93.9 97.8   MCH 27.0 - 33.0 pg 30.7 32.5   MCHC 32.0 - 36.0 g/dL 67.2 66.6   RDW 88.9 - 15.0 % 14.1 13.5      Latest Reference Range & Units 09/22/23 07:26 01/24/24 08:02 04/28/24 07:50   Ferritin 16 - 288 ng/mL 18 15 (L) 12 (L)   Iron, serum 45 - 160 mcg/dL 899 859 840   Transferrin, Serum/Plasma 182 - 360 mg/dL 675     Folates, Serum ng/mL  >24.0 >24.0   Vitamin B12 200 - 1,100 pg/mL  636 632   (L): Data is abnormally low  -----  Current Outpatient Medications   Medication Instructions    albuterol  90 mcg/actuation metered dose inhaler 2 puffs, Every 6 Hours PRN    alcohol swabs swab Use as directed    atorvastatin  (LIPITOR ) 20 mg, Daily Scheduled    BD INSULIN  SYRINGE 1 mL 29 gauge x 1/2"  SYRINGE 2 Times Daily Scheduled    celebrate calcium  citrate + vitamin D3 (CELEBRATE) soft chew 1 tablet, Oral, 2 Times Daily With Meals Scheduled    fluticasone  propionate (FLONASE ) 50 mcg/actuation nasal spray 2 sprays, Each Nostril, 2 Times Daily Scheduled    glucosamine-chondroitin 500-400 mg capsule Oral, 2 Times Daily Scheduled, .  Temporarily stop taking after surgery. Follow up with your surgeon's office before restarting.    insulin  glargine (LANTUS , BASAGLAR , SEMGLEE , REZVOGLAR ) 14 Units, Subcutaneous, 2 times daily    losartan  (COZAAR ) 25 mg, Oral, Daily Scheduled, . Temporarily stop taking this medication after surgery. Follow up with your surgeon's office regarding when to restart.    metFORMIN  (GLUCOPHAGE ) 1,000 mg, 2 Times Daily Scheduled    metoprolol  tartrate (LOPRESSOR ) 12.5 mg, Oral, Twice Daily, . REPLACE metoprolol  succinate (XL formulation) for 3 months    montelukast  (SINGULAIR ) 10 mg, Daily Scheduled    multivitamin-min-iron-FA-vit K (PROCARE BARIATRIC) 45 mg iron- 800 mcg-120 mcg capsule 1 capsule,  Oral, Daily At Bedtime Scheduled    omeprazole  (PRILOSEC) 20 mg, Oral, Daily Scheduled    ONETOUCH VERIO TEST STRIPS test strip TEST BLOOD SUGAR TWO TIMES A DAY. E11.65    oxymetazoline (AFRIN) 0.05 % nasal spray 2 sprays, Each Nostril, Twice Daily    pen needle, diabetic 32 gauge x 5/32" needle Use once daily as directed.    Lobbyist: Use as directed to discard insulin  needles and lancets with each use.    sodium chloride  (OCEAN) 0.65 % nasal spray 2 sprays, Each Nostril, 3 Times Daily     Allergies: Atorvastatin , Lovastatin, and Lisinopril  Past Medical History:   Diagnosis Date    Acid reflux     Asthma     Diabetes mellitus (CMS code)     Essential hypertension     High cholesterol     History of esophagogastroduodenoscopy (EGD)     Knee pain     Low back pain     Obesity     Sleep apnea     Uterine cancer (CMS code)      Past Surgical History:   Procedure Laterality Date    ANKLE FRACTURE SURGERY      CARPAL TUNNEL RELEASE      FINGER SURGERY      screw placement    HYSTERECTOMY      NASAL FRACTURE SURGERY      SHOULDER SURGERY      TUBAL LIGATION       Social History     Tobacco Use    Smoking status: Never    Smokeless tobacco: Never   Substance and Sexual Activity    Alcohol use: Not Currently     Comment: past rare social use; no history of problem use    Drug use: Never     Comment: no history of Rx med abuse or illicit substance use    Sexual activity: Not Currently     Family History: No known family history of bleeding or clotting disorders nor hematologic malignancies.     Physical Exam     Hem Vital Signs as of 10/13/23       BP 128/51    Heart Rate 81    Temp 36.6 C (97.9 F)    *Resp 20    SpO2 99 %          General :   alert, no acute distress, cooperative and conversant   Eyes :   anicteric sclerae  ENMT :  normal external nose and ears   Respiratory :  normal inspiratory effort   Skin :  no obvious rash or jaundice   Neuro :  speech clear; grossly non-focal.  independently ambulatory   Psych :  normal affect   Hematologic :  no bruising noted       Laboratory     Results for orders placed or performed in visit on 02/11/24   Comprehensive Metabolic Panel (BMP, AST, ALT, T.BILI, ALKP, TP, ALB)   Result Value Ref Range    Glucose 112 (H) 65 - 99 mg/dL    Urea Nitrogen, serum/plasma 14 7 - 25 mg/dL    Creatinine, Serum / Plasma 0.72 0.50 - 1.05 mg/dL    eGFRcr 94 > OR = 60 mL/min/1.65m2    BUN/Creatinine Ratio SEE NOTE: 6 - 22 (calc)    Sodium, Serum / Plasma 141 135 - 146 mmol/L    Potassium, Serum / Plasma 4.3 3.5 - 5.3 mmol/L    Chloride, Serum / Plasma 105 98 - 110 mmol/L    Carbon Dioxide, Total 27 20 - 32 mmol/L    Calcium , total, Serum / Plasma 9.4 8.6 - 10.4 mg/dL    Protein, Total, Serum / Plasma 6.7 6.1 - 8.1 g/dL    Albumin, Serum / Plasma 4.4 3.6 - 5.1 g/dL    Globulin, Total 2.3 1.9 - 3.7 g/dL (calc)    Albumin/Globulin Ratio 1.9 1.0 - 2.5 (calc)    Bilirubin, Total 0.4 0.2 - 1.2 mg/dL    Alkaline Phosphatase 57 37 - 153 U/L    AST 21 10 - 35 U/L    Alanine transaminase 23 6 - 29 U/L   Lipid Panel (incl. LDL, HDL, Total Chol. and Trig.)   Result Value Ref Range    Cholesterol, Total 156 <200 mg/dL    Cholesterol, HDL 44 (L) > OR = 50 mg/dL    Triglycerides, serum 119 <150 mg/dL    LDL Cholesterol 90 mg/dL (calc)    Chol HDL Ratio 3.5 <5.0 (calc)    LDl/HDL Ratio 2.0 (calc)    Non HDL Cholesterol 112 <130 mg/dL (calc)   Complete Blood Count   Result Value Ref Range    WBC Count 5.0 3.8 - 10.8 Thousand/uL    RBC Count 4.55 3.80 - 5.10 Million/uL    Hemoglobin 14.8 11.7 - 15.5 g/dL    Hematocrit 55.4 64.9 - 45.0 %    MCV 97.8 80.0 - 100.0 fL    MCH 32.5 27.0 - 33.0 pg    MCHC 33.3 32.0 - 36.0 g/dL    RDW-CV 86.4 88.9 - 84.9 %    Platelet Count 179 140 - 400 Thousand/uL    MPV 11.1 7.5 - 12.5 fL   Vitamin B12 and Folate   Result Value Ref Range    Vitamin B12 632 200 - 1,100 pg/mL    Folates, Serum >24.0 ng/mL   Vitamin B1 (Thiamine), Whole Blood   Result Value Ref  Range    VITAMIN B1 (THIAMINE), BLOOD 148 78 - 185 nmol/L   Ferritin, Serum/Plasma   Result Value Ref Range    Ferritin, Serum/Plasma 12 (L) 16 - 288 ng/mL   Iron (consider ordering Iron panel + ferritin instead)   Result Value Ref Range    Iron, serum 159 45 - 160 mcg/dL   Copper, serum/plasma   Result Value Ref Range    Copper 105 70 - 175 mcg/dL   Vitamin D, 25-Hydroxy  Result Value Ref Range    Vitamin D, 25-Hydroxy 68 30 - 100 ng/mL   Hemoglobin A1c   Result Value Ref Range    Hemoglobin A1c 6.8 (H) <5.7 %   Parathyroid Hormone with Calcium    Result Value Ref Range    PTH 33 16 - 77 pg/mL    Calcium , total, Serum / Plasma 9.5 8.6 - 10.4 mg/dL   Zinc , Serum/Plasma   Result Value Ref Range    Zinc  75 60 - 130 mcg/dL   Selenium, Serum/Plasma   Result Value Ref Range    Selenium, Serum/Plasma 156 63 - 160 mcg/L   Vitamin A   Result Value Ref Range    Vitamin A 54 38 - 98 mcg/dL       Latest Reference Range & Units 01/24/24 08:09   Apixaban  Anti-Xa ng/mL 74      Latest Reference Range & Units 09/22/23 07:26 01/24/24 08:02 04/28/24 07:50   Ferritin 16 - 288 ng/mL 18 15 (L) 12 (L)   Iron, serum 45 - 160 mcg/dL 899 859 840   Transferrin, Serum/Plasma 182 - 360 mg/dL 675     Folates, Serum ng/mL  >24.0 >24.0   Vitamin B12 200 - 1,100 pg/mL  636 632   (L): Data is abnormally low    Radiology   US  Doppler Lower Extremity Venous, Bilateral    Result Date: 11/07/2023  Impression Limited by nonvisualization of the right peroneal vein due to poor acoustic attenuation.  Within this limit, negative examination for deep venous thrombosis in the bilateral lower extremities from the common femoral vein through the ankle. Report dictated by: Lonni Chancy, MD, signed by: Naomie DOROTHA PATRIC Gaye, MD Department of Radiology and Biomedical Imaging    CTA Chest Pulmonary Embolism (CTPE)    Result Date: 11/07/2023  Impression Several scattered subsegmental pulmonary emboli as described above. Enlarged pulmonary artery disproportionate  to the size and number of pulmonary emboli. //Impression 1 secure messaged to  Darlyne Costain, NP (General Surgery) by Dr. Norleen CHRISTELLA Rigg, MD (Radiology) on 11/06/2023 10:35 PM.// Report dictated by: Norleen CHRISTELLA Rigg, MD, signed by: Hosey Ozell Chafe, MD Department of Radiology and Biomedical Imaging    XR Chest 1 View (AP Portable)    Result Date: 11/05/2023  Impression FINDINGS/IMPRESSION: Improved lung volumes however with persistent perihilar and bibasilar airspace opacities which may reflect multifocal infection, aspiration, and/or edema. No pleural effusion or pneumothorax. Unchanged cardiac and mediastinal contours. Report dictated by: Jinny Peek, MD, signed by: Jinny Peek, MD Department of Radiology and Biomedical Imaging    XR Chest 1 View (AP Portable)    Result Date: 11/03/2023  Impression FINDINGS/IMPRESSION: Diffuse hazy interstitial and airspace opacities which may be partially exaggerated due to low lung volumes however could reflect pulmonary edema or diffuse infection such as viral infection. No significant pleural effusion or pneumothorax. Unremarkable cardiac and mediastinal contours when accounting for low lung volumes and portable technique. Report dictated by: Jinny Peek, MD, signed by: Jinny Peek, MD Department of Radiology and Biomedical Imaging    NM Myocardial Pharmacologic Stress Test (Radiology Performed)    Result Date: 09/22/2023  Impression 1.  Negative study for ischemia or scar. 2.  Overall gated left ventricular systolic function was normal with a calculated LVEF of 66% at stress and 63% at rest. 3.  No left ventricular dilatation. Report dictated by: Aloysius Emil Theophilus Ramsey, MD PhD, signed by: Aloysius Emil Theophilus Ramsey, MD PhD Department of Radiology and Biomedical Imaging  Pathology        Assessment and Plan   In Summary, Susan Goodwin is a 63 y.o. female with PMH HTN, asthma, OSA, s/p lap roux en y gastrectomy on 11/02/23, with post op course c/b  multiple subsegmental PE. Presents in follow up.     #multiple subsegmental pulmonary emboli (11/06/23)  With LE ultrasound NEGATIVE for DVT, however we recognize this was a limited exam. This most likely represents a provoked event in the post-operative setting, though the CTA raises concern for an enlarged pulmonary artery disproportionate to size and number of PE. She has been referred to pulmonology for [facility] evaluation but has not yet been seen. Typically, provoked VTE is treated with 3-6 months of anticoagulation. 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      Additionally, due to recent bariatric surgery, we obtained a trough anti-Xa level to ensure adequate absorption of apixaban  - this was sufficient.    - hold off on apixaban  for now given concern for GI bleeding  - TTE (I've ordered)  - referral to pulmonology to evaluate for pHTN/CTEPH - if concern for CTEPH would continue anticoagulation and pursue additional thrombophila workup  - ensure up to date on cancer screenings - due for mammo    #iron deficiency without anemia  #concern for GI bleeding  This appears to predate her bariatric surgery (ferritin 18 on 09/22/23), worsened in setting of malabsorption, though now with concern for GI bleeding given reports of dark, tarry stool (even OFF eliquis ).   - repeat CBC, ferritin, sat   - we reviewed strict ED precautions  - arrange IV iron (infed 750mg  x1) given inability to tolerate PO iron  - will need EGD/colo, H pylori testing    Follow up in 1 month.     Thank you for this referral. It is my privilege to participate in the hematologic care of your patients. Patient seen and discussed with Dr. Alfreida.     Shalamar Crays  Candis Madrid, MD   Department of Medicine  Hematology  [facility]  office [phone]  fax [phone]  05/16/2024    Patient Care Team:  Franky Priest, DO as PCP - General (Family Medicine)         I performed this evaluation using real-time telehealth tools, including a live video Zoom connection between my location and the patient's location. Prior to initiating, the patient consented to perform this evaluation using telehealth tools.

## 2024-05-23 NOTE — Telephone Encounter (Signed)
 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 I scheduled for first available on my end: 07/10/24 9am with Dr. Gwenn. Sending TE, per scheduling instructions.     Susan Goodwin  [facility]

## 2024-07-10 ENCOUNTER — Ambulatory Visit: Admit: 2024-07-10 | Discharge: 2024-07-18 | Payer: Medicare HMO

## 2024-07-10 DIAGNOSIS — I2510 Atherosclerotic heart disease of native coronary artery without angina pectoris: Secondary | ICD-10-CM

## 2024-07-10 DIAGNOSIS — I272 Pulmonary hypertension, unspecified: Secondary | ICD-10-CM

## 2024-07-10 MED ORDER — FLUTICASONE 100 MCG-SALMETEROL 50 MCG/DOSE BLISTR POWDR FOR INHALATION
100-50 | Freq: Two times a day (BID) | RESPIRATORY_TRACT | 11 refills | Status: AC
Start: 2024-07-10 — End: 2025-07-10

## 2024-07-10 NOTE — Patient Instructions (Signed)
 Dear Susan Goodwin  It was lovely to see you and your husband in clinic today.     We discussed that you had blood clots in your lungs and changes on your heart just after the surgery.   We will repeat these tests first, as some of these issues might have resolved.     I have arranged an echo at [facility] - if you do not hear in 2 weeks please contact them to schedule.  I have arranged a CT scan at [facility].     I will see you after these tests are done via video in about 6 weeks.     With respect to your asthma - I suggest an additional regular puffer - I have prescribed advair. This is to be taken regularly morning and night. Please rinse your mouth out after use.     I also suggest a repeat sleep study - your PCP will help arrange.     Sincerely  Dr Sid Das

## 2024-07-10 NOTE — Progress Notes (Signed)
 [place] [place] [facility]   I had the pleasure of seeing Susan Goodwin in a consultation visit on 07/10/2024 in the Pulmonary Hypertension Clinic at the [place] [facility] [place]  [facility]. Susan Goodwin is a 63 y.o. 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   CT A - multiple subsegmental PE - but the small degree out of keeping with enlarged PA diameter.   Lower limb USS negative for DVT     Seen by Hem - recommend at least 6 months eliquis , longer duration dependent on if has CTEPH   Although [facility] has had some intermittent GI bleeding, so self discontinued.     TTE 10/29/23 - difficult to assess  RV mildly increased       Prior to operation - no significant dyspnea  Only irritant asthma   No dizziness, no chest pain     Current function  No dyspnea  No chest pain   Dizziness at times with ? Postural hypotension and low BSLs  No edema       Other active respiratory issues:    Asthma -  describes as somewhat uncontrolled  Takes albuterol  BID  When flares - takes 4 hrly   Goes to ER abt twice a year   Triggers - cold and fog ie October months, irritants ie cigarette smoke, etc     Appears somewhat uncontrolled on BID albuterol   Suggest ICS From Sept onwards - I will prescribe advair but she could have any ics         OSA  Diagnosed at least 2014. AHI 36.   Pre op - prescribed CPAP but not using - felt like choking  Had a repeat PSG 4 yrs ago - unclear what the results were  Reports feels this is much improved post op   Wakes feeling rested, not usually have nap. Husband reports snoring a lot less. No clear apneas.   Reports PCP has referred for further review. Suggest repeat PSG and if persistent severe consider CPAP again           Other medical  history  Iron deficiency anemia   Asthma  OSA    Possibly planning for knee replacement   Knee and shoulder arthritis     No other signs or symptoms of autoimmune disease  No raynauds          The following portions of the patient's history were reviewed by a provider in this encounter and updated as appropriate:         Past Medical History  She  has a past medical history of Acid reflux, Asthma, Diabetes mellitus (CMS code), Essential hypertension, High cholesterol, History of esophagogastroduodenoscopy (EGD), Knee pain, Low back pain, Obesity, Sleep apnea, and Uterine cancer (CMS code).    Past Surgical History  She  has a past surgical history that includes Hysterectomy; Ankle fracture surgery; Nasal fracture surgery; Carpal tunnel release; shoulder surgery; Tubal ligation; and Finger surgery.    Social History  She  reports that she has never smoked. She has never used smokeless tobacco. She reports that she does not currently use alcohol. She reports that she does not use drugs.  [place] [address].    Family History  Her  family history includes Mental health problem in her mother.    REVIEW OF SYSTEMS:  ROS    Allergies:  Atorvastatin , Lovastatin, and Lisinopril     Medications:  Medications the patient states to be taking prior to today's encounter.   Medication Sig    albuterol  90 mcg/actuation metered dose inhaler Inhale 2 puffs into the lungs every 6 (six) hours as needed for Shortness of Breath    alcohol swabs swab Use as directed    atorvastatin  (LIPITOR ) 20 mg tablet Take 1 tablet (20 mg total) by mouth in the morning.    BD INSULIN  SYRINGE 1 mL 29 gauge x 1/2" SYRINGE in the morning and before bedtime.    celebrate calcium  citrate + vitamin D3 (CELEBRATE) soft chew Chew 1 tablet by mouth in the morning and 1 tablet in the evening. Chew with meals.    fluticasone  propionate (FLONASE ) 50 mcg/actuation nasal spray Use 2 sprays in each nostril in the morning and 2 sprays before bedtime.     glucosamine-chondroitin 500-400 mg capsule Take by mouth in the morning and before bedtime. .  Temporarily stop taking after surgery. Follow up with your surgeon's office before restarting.    insulin  glargine (LANTUS , BASAGLAR , SEMGLEE , REZVOGLAR ) 100 unit/mL (3 mL) injection pen Inject 14 Units under the skin in the morning and at bedtime    losartan  (COZAAR ) 25 mg tablet Take 1 tablet (25 mg total) by mouth in the morning. . Temporarily stop taking this medication after surgery. Follow up with your surgeon's office regarding when to restart.    metFORMIN  (GLUCOPHAGE ) 1,000 mg tablet Take 1 tablet (1,000 mg total) by mouth 2 (two) times daily (Patient taking differently: Take 0.5 tablets (500 mg total) by mouth 2 (two) times daily)    montelukast  (SINGULAIR ) 10 mg tablet Take 1 tablet (10 mg total) by mouth in the morning.    multivitamin-min-iron-FA-vit K (PROCARE BARIATRIC) 45 mg iron- 800 mcg-120 mcg capsule Take 1 capsule by mouth nightly at bedtime    omeprazole  (PRILOSEC) 20 mg capsule Take 1 capsule (20 mg total) by mouth daily    ONETOUCH VERIO TEST STRIPS test strip     pen needle, diabetic 32 gauge x 5/32" needle Use once daily as directed.    Lobbyist: Use as directed to discard insulin  needles and lancets with each use.        Physical exam:  VITALS: Reviewed. Blood pressure (!) 95/32, pulse 64, temperature 36.5 C (97.7 F), temperature source Oral, resp. rate 17, height 157.5 cm (5\' 2" ), weight (!) 111 kg (244 lb 11.2 oz), SpO2 98%. Body mass index is 44.76 kg/m.    CONSTITUTIONAL: WDWN, NAD; Awake, alert, grossly oriented  EYES: Sclerae anicteric, pupils symmetrical, no conjunctival injection  ENT; oropharynx clear, mucosa and membranes are moist, no thyromegaly, trachea midline, no masses  RESPIRATORY: normal resp effort, lungs clear to auscultation bilaterally, no w/r/r, no clubbing   CARDIOVASCULAR: regular rate and rhythm, no murmurs, rubs or gallops, no edema  GI: the  abdomen is soft, nontender, nondistended; no hsm, no masses, bowel sounds are present  MUSCULOSKELETAL: normal muscle bulk throughout, no joint or muscle tenderness  LYMPHATIC: without cervical or axillary lymphadenopathy  SKIN:  without rashes/lesions, warm, normal turgor.   PSYCH: Good judgement and insight, affect normal, appropriate mood  NEURO: Cranial nerves intact, gait normal, memory good, no gross motor or sensory defects    Laboratory values (personally reviewed):  Most recent relevant labs  Component Value Date    WBC Count 5.0 04/28/2024    Hemoglobin 14.8 04/28/2024    Hematocrit 44.5 04/28/2024    MCV 97.8 04/28/2024    Platelet Count 179 04/28/2024     Lab Results   Component Value Date    NA 141 04/28/2024    K 4.3 04/28/2024    CL 105 04/28/2024    CO2 27 04/28/2024    BUN 14 04/28/2024    CREAT 0.72 04/28/2024    GLU 112 (H) 04/28/2024     Most recent relevant labs   Component Value Date    Alanine transaminase 23 04/28/2024    AST 21 04/28/2024    Alkaline Phosphatase 57 04/28/2024    Bilirubin, Total 0.4 04/28/2024     Most recent relevant labs   Component Value Date    PT 13.8 11/07/2023    INR 1.1 11/07/2023     BNP (pg/mL)   Date Value   11/07/2023 12         I personally reviewed and interpreted the following studies:    CTA post op 11/07/23  Several scattered subsegmental pulmonary emboli as described above. Enlarged pulmonary artery disproportionate to the size and number of pulmonary emboli.   No parenchymal lung disease    TTE 11/08/23  Conclusions:  Due to technical issues, the quality of the study was limited. The patient's blood pressure was 113 mmHg/103 mmHg during the study. The heart rate during the study was 81. Color Flow Doppler was utilized for this exam. Spectral Doppler was utilized for   this exam.  1. The left ventricular volume is normal. LV function is hyperdynamic. 2D LV ejection fraction is estimated to be 70 to 75%. There is no left ventricular hypertrophy. No  segmental wall motion abnormalities present.  2. The right ventricular volume is mildly increased. The right ventricle is not well seen but probably normal in function.  3. Left atrial size is normal. The right atrium is not well seen.  4. There is no hemodynamically significant valvular disease.  5. Diastolic function is indeterminate.  6. The pulmonary artery systolic pressure cannot be determined due to the lack of a complete TR jet.  7. No pericardial effusion noted. The IVC is not well seen and RA pressure cannot be estimated.  8. Aortic root dimension is normal.  9. No evidence of early or late shunting was seen with right sided saline contrast administration.      W[facility]PAT AMBULATORY SLEEP STUDY 08/09/13    Diagnosis: OSA (AHI= 36; ODI = 29, Min O2 sat 81%; Mean O2 sat =91%; Duration o2 Sat <90% = 73 minutes)  ESS = 14       Assessment/Plan   63 y.o. 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     She currently feels well, with no signs or symptoms of right heart failure. She has ceased her anticoagulation.   I suggest in the first instance we repeat the workup, including CTA and TTE.   I have ordered the TTE at [facility] copy Dr Kela and the CTA at [facility].     I will see her in about 6-8 weeks via video after these tests are done to review the results and discuss next steps.   If there is clear evidence of PH on TTE, I will arrange  for a RHC and we will review if pulmonary vasodilators are appropriate.   If there is ongoing PE, she will need to re-institute anticoagulation, and we can discuss more definitive management dependent on the clot location and burden.       With respect to her asthma - it appears this is at times uncontrolled. I suggest inhaled corticosteroids. I have prescribed advair, but she could have any of these agents.        With respect to her OSA - I recommend she has a repeat diagnostic PSG off CPAP to evaluate the degree of OSA post op.     She reports some dizziness especially on change in posture. ON exam today her BP was 95/32  I suggest halving the losartan , and PCP review ongoing the need for antihypertensives.           Thank you for allowing me to take part in the care of this patient.       Sincerely,    Dr Sid Das MBBS PhD MPH  Associate Professor of Clinical Medicine  Division of Pulmonary and Critical Care  [place] [facility] [place] [facility] [place]

## 2024-07-18 LAB — ECG 12-LEAD
Atrial Rate: 63 {beats}/min
Calculated P Axis: 5 degrees
Calculated R Axis: 39 degrees
Calculated T Axis: 35 degrees
P-R Interval: 158 ms
QRS Duration: 92 ms
QT Interval: 396 ms
QTcb: 405 ms
Ventricular Rate: 63 {beats}/min

## 2024-10-17 MED ORDER — LANTUS SOLOSTAR U-100 INSULIN 100 UNIT/ML (3 ML) SUBCUTANEOUS PEN
100 | SUBCUTANEOUS | 2 refills | 60.00000 days | Status: AC
Start: 2024-10-17 — End: ?

## 2024-10-29 LAB — IRON: Iron, serum: 192 ug/dL — ABNORMAL HIGH (ref 45–160)

## 2024-10-29 LAB — COMPREHENSIVE METABOLIC PANEL
AST: 20 U/L (ref 10–35)
Alanine transaminase: 35 U/L — ABNORMAL HIGH (ref 6–29)
Albumin, Serum / Plasma: 4 g/dL (ref 3.6–5.1)
Albumin/Globulin Ratio: 1.6 (calc) (ref 1.0–2.5)
Alkaline Phosphatase: 60 U/L (ref 37–153)
BUN/Creatinine Ratio: 23 (calc) — ABNORMAL HIGH (ref 6–22)
Bilirubin, Total: 0.4 mg/dL (ref 0.2–1.2)
Calcium, total, Serum / Plasma: 9.2 mg/dL (ref 8.6–10.4)
Carbon Dioxide, Total: 31 mmol/L (ref 20–32)
Chloride, Serum / Plasma: 105 mmol/L (ref 98–110)
Creatinine, Serum / Plasma: 0.65 mg/dL (ref 0.50–1.05)
Globulin, Total: 2.5 g/dL (ref 1.9–3.7)
Glucose: 78 mg/dL (ref 65–99)
Potassium, Serum / Plasma: 3.9 mmol/L (ref 3.5–5.3)
Protein, Total, Serum / Plasma: 6.5 g/dL (ref 6.1–8.1)
Sodium, Serum / Plasma: 142 mmol/L (ref 135–146)
Urea Nitrogen, serum/plasma: 15 mg/dL (ref 7–25)
eGFRcr: 99 mL/min/1.73m2 (ref >=60)

## 2024-10-29 LAB — VITAMIN B12 AND FOLATE
Folates, Serum: >24 ng/mL
Vitamin B12: 724 pg/mL (ref 200–1100)

## 2024-10-29 LAB — LIPID PANEL
Chol HDL Ratio: 4.1 (calc) (ref <5.0)
Cholesterol, HDL: 55 mg/dL (ref >=50)
Cholesterol, Total: 224 mg/dL — ABNORMAL HIGH (ref <200)
LDL Cholesterol: 137 mg/dL — ABNORMAL HIGH
LDl/HDL Ratio: 2.5 (calc)
Non HDL Cholesterol: 169 mg/dL — ABNORMAL HIGH (ref <130)
Triglycerides, serum: 188 mg/dL — ABNORMAL HIGH (ref <150)

## 2024-10-29 LAB — COMPLETE BLOOD COUNT
Hematocrit: 46.4 % — ABNORMAL HIGH (ref 35.9–46.0)
Hemoglobin: 15.3 g/dL (ref 11.7–15.5)
MCH: 32.8 pg (ref 27.0–33.0)
MCHC: 33 g/dL (ref 31.6–35.4)
MCV: 99.6 fL (ref 81.4–101.7)
MPV: 10.7 fL (ref 7.5–12.5)
Platelet Count: 186 Thousand/uL (ref 140–400)
RBC Count: 4.66 Million/uL (ref 3.80–5.10)
RDW-CV: 13 % (ref 11.0–15.0)
WBC Count: 6.9 Thousand/uL (ref 3.8–10.8)

## 2024-10-29 LAB — PARATHYROID HORMONE WITH CALCIUM
Calcium, total, Serum / Plasma: 9.2 mg/dL (ref 8.6–10.4)
PTH: 36 pg/mL (ref 16–77)

## 2024-10-29 LAB — ZINC, SERUM / PLASMA: Zinc: 81 ug/dL (ref 60–130)

## 2024-10-29 LAB — VITAMIN D, 25-HYDROXY: Vitamin D, 25-Hydroxy: 60 ng/mL (ref 30–100)

## 2024-10-29 LAB — COPPER, SERUM / PLASMA: Copper: 102 ug/dL (ref 70–175)

## 2024-10-29 LAB — SELENIUM, SERUM/PLASMA: Selenium, Serum/Plasma: 143 ug/L (ref 63–160)

## 2024-10-29 LAB — VITAMIN A: Vitamin A: 80 ug/dL (ref 38–98)

## 2024-10-29 LAB — VITAMIN B1 (THIAMINE), WHOLE BLOOD: VITAMIN B1 (THIAMINE), BLOOD: 91 nmol/L (ref 78–185)

## 2024-10-29 LAB — HEMOGLOBIN A1C: Hemoglobin A1c: 6.9 % — ABNORMAL HIGH (ref <5.7)

## 2024-10-29 LAB — FERRITIN: Ferritin, Serum/Plasma: 27 ng/mL (ref 16–288)

## 2024-11-03 ENCOUNTER — Ambulatory Visit: Admit: 2024-11-03 | Payer: Medicare HMO | Attending: Physician Assistant

## 2024-11-03 DIAGNOSIS — Z9884 Bariatric surgery status: Secondary | ICD-10-CM

## 2024-11-03 MED ORDER — TIRZEPATIDE 2.5 MG/0.5 ML SUBCUTANEOUS PEN INJECTOR
2.5 | SUBCUTANEOUS | 2 refills | 28.00000 days | Status: AC
Start: 2024-11-03 — End: ?

## 2024-11-03 NOTE — Progress Notes (Signed)
 HPI  I saw Susan Goodwin today in the [facility]. This is her 12 month follow up.  She had the following procedure(s) done on 11/02/2023: laparoscopic roux-en-y gastric bypass with Dr. Sharl.    She is taking a daily bariatric multivitamin:  Yes.  She is taking a daily calcium  + vitamin D supplement:  Yes.    Off anticoagulation.  She was on ozempic  prior to surgery for diabetes.  Has not been on a glp-1a post-surgery.  She continues to take metformin  and insulin  for diabetes management.  Reduced metformin  from 1000mg  to 500mg  twice daily.  BG in the AM around 80.  For insulin  taking 15 U in the AM 14-20 U in the PM.      Taking smooth move tea and senna to help prevent constipation. No breakthrough heartburn symptoms.      Taking half a losartan  in the AM (started this a few days ago), not take metoprolol  anymore.      She stopped her statin for 1 mo.  She is now back on atorvastatin  20mg .    She is interested in excess skin removal in the future.  Also notes that he may need knee replacement surgery in the future.  She is much more mobile than she had been prior to surgery.      Weight Loss and Diet  She weight history is as follows:   Wt Readings from Last 10 Encounters:   11/03/24 (!) 104.5 kg (230 lb 4.8 oz)   07/10/24 (!) 111 kg (244 lb 11.2 oz)   05/08/24 (!) 110.7 kg (244 lb)   02/09/24 (!) 118.8 kg (262 lb)   12/22/23 (!) 126.6 kg (279 lb)   11/19/23 (!) 133.8 kg (295 lb)   11/08/23 (!) 134.8 kg (297 lb 1.6 oz)   10/13/23 (!) 141.5 kg (312 lb)   09/30/23 (!) 141.5 kg (312 lb)   09/14/23 (!) 143.8 kg (317 lb)     Today's Weight: (!) 104.5 kg (230 lb 4.8 oz)  Height: 157.5 cm (5\' 2" )  BMI (Calculated): 42.2    Preop Weight (kg): 144.2 kg (318 lb)    Weight Loss Since Preop (kg): 39.78 kg  Total Weight Change Percent: 27.58 Percent  Percent Excess Weight Loss: 48.39 Percent    IBW in kg (Bariatric): 62 kg  IBW in lb (Bariatric): 136.69    Her goal weight is 170 pounds.      Obesity-related Co-morbidity Resolution  Before bariatric surgery, her obesity-related co-morbidities were:  Diabetes - Improved  Hypertension - Improved  Hyperlipidemia - unchanged  Obstructive Sleep Apnea - Improved  OSA on CPAP -- No  GERD - well controlled with omeprazole     Nutritional and Metabolic Laboratory Assessment  I reviewed the patient's nutritional and metabolic labs today:  ALT 35, ferritin 12->27, cholesterol 156->224, HDL 44->55, hemoglobin A1C 6.9, otherwise labs wnl    Medications and Vitamins  I reviewed and changed her medications and vitamins supplements today as needed, which are:  Current Medications         Dosage    tirzepatide  (MOUNJARO ) 2.5 mg/0.5 mL injection pen Inject 0.5 mL (2.5 mg total) under the skin every 7 (seven) days    albuterol  90 mcg/actuation metered dose inhaler Inhale 2 puffs into the lungs every 6 (six) hours as needed for Shortness of Breath    alcohol swabs swab Use as directed    atorvastatin  (LIPITOR ) 20 mg tablet Take 1 tablet (20 mg  total) by mouth in the morning.    BD INSULIN  SYRINGE 1 mL 29 gauge x 1/2" SYRINGE in the morning and before bedtime.    celebrate calcium  citrate + vitamin D3 (CELEBRATE) soft chew Chew 1 tablet by mouth in the morning and 1 tablet in the evening. Chew with meals.    fluticasone  propion-salmeteroL (ADVAIR DISKUS) 100-50 mcg/dose diskus inhaler Inhale 1 puff into the lungs in the morning and 1 puff in the evening. Rinse mouth with gargle after each use.    fluticasone  propionate (FLONASE ) 50 mcg/actuation nasal spray Use 2 sprays in each nostril in the morning and 2 sprays before bedtime.    glucosamine-chondroitin 500-400 mg capsule Take by mouth in the morning and before bedtime. .  Temporarily stop taking after surgery. Follow up with your surgeon's office before restarting.    insulin  glargine (LANTUS  SOLOSTAR U-100 INSULIN ) 100 unit/mL (3 mL) injection pen INJECT 14 UNITS UNDER THE SKIN IN THE MORNING AND AT BEDTIME     losartan  (COZAAR ) 25 mg tablet Take 1 tablet (25 mg total) by mouth in the morning. . Temporarily stop taking this medication after surgery. Follow up with your surgeon's office regarding when to restart.    metFORMIN  (GLUCOPHAGE ) 1,000 mg tablet Take 1 tablet (1,000 mg total) by mouth 2 (two) times daily    montelukast  (SINGULAIR ) 10 mg tablet Take 1 tablet (10 mg total) by mouth in the morning.    multivitamin-min-iron-FA-vit K (PROCARE BARIATRIC) 45 mg iron- 800 mcg-120 mcg capsule Take 1 capsule by mouth nightly at bedtime    omeprazole  (PRILOSEC) 20 mg capsule Take 1 capsule (20 mg total) by mouth daily    ONETOUCH VERIO TEST STRIPS test strip     oxymetazoline (AFRIN) 0.05 % nasal spray Use 2 sprays in each nostril in the morning and 2 sprays in the evening.    pen needle, diabetic 32 gauge x 5/32" needle Use once daily as directed.    Lobbyist: Use as directed to discard insulin  needles and lancets with each use.    sodium chloride  (OCEAN) 0.65 % nasal spray Use 2 sprays in each nostril every morning, afternoon, and evening.          Physical Exam   Vitals: BP (!) 118/43 (BP Location: Left upper arm, Patient Position: Sitting, Cuff Size: Adult)   Pulse 77   Temp 36.7 C (98.1 F) (Oral)   Ht 157.5 cm (5\' 2" )   Wt (!) 104.5 kg (230 lb 4.8 oz)   SpO2 98%   BMI 42.12 kg/m   Constitutional: oriented to person, place, and time.  Skin: well-healed port site incisions without infection, hernia, or seroma. Excess skin on the abdominal wall, upper arms, and thighs.      Assessment and Plan  This is a 63 y.o. female doing well after bariatric surgery.  I would like to see the patient again in 3 months.  No labs needed for this visit.  I will keep you apprised of her progress.    Will start tirzepatide  2.5mg  once weekly for treatment of diabetes and obesity.  Counseled her to continue with close blood glucose monitoring; will say 70-100 is a normal fasting blood glucose level.  I  anticipate her insulin  requirement will further reduce with the addition of tizepatide.  Will see how she is tolerating the medication in 3-4 weeks.  Will plan to increase to 5mg  next month if tolerating well.      Recommend she  continue to take lipitor  20mg  once daily.  I expect her lipid panel will improve with being back on the medication.      Continue on losartan  12.5mg  once daily.  If bp is too low can hold it.      Will pan for plastic surgery referral once BMI is closer to 33 or less.     Goals after bariatric surgery:  - Patients should strive for a healthy BMI with as best resolution / prevention of obesity related comorbid diseases as possible.  ?  Daily Water  Requirements:  - Daily water  goals for the standard patient including 64 oz minimum water  intake (1-2 Liters) some medical conditions may require careful balancing of water  intake to avoid health complications, please refer to your specialists as needed to adjust water  intake adequately to maintain hydration but minimize risk of exacerbation of chronic disease.  ?  Daily Multi Vitamins:  - It is very important to take a daily high potency multi vitamin for life. The [facility] team recommends the Bariatric Pro Care Multi Vitamin with 45mg  of iron (chewable or capsule) once daily (capsules can be swallowed whole).  - This is available only online without need of a prescription at [email or web address]  ?  Daily Calcium  Supplementation:  - You should take 1200-1500 mg daily of calcium  citrate for life. It is best to take your calcium  supplementation in small doses throughout the day for best absorption. There are many forms available including: tablet, chewable, soft chew, gummies etc. It is recommended that patients find a calcium  citrate brand that they enjoy, that is calorie free or very low calorie. These are available at any pharmacy or online without the need of a prescription. We recommend trying the Celebrate brand Calcium  citrate soft  chews available at celebratevitamins.com  ?  Daily Protein Requirements:  - Goal should be protein intake between 60-80 grams per day. Some options to help you reach your daily goals including protein shakes or powders, lean meats (turkey, fish, chicken), tofu, etc. In addition you will need green vegetable intake to maintain adequate nutrition and you should avoid sugars, carbohydrates, starches, pasta, rice, bread, and potatoes.  ?  Calorie Goals - Weight Loss Phase:  - Daily Calorie Goal of 500-700 total per day, focusing on lean proteins and leafy green vegetables during the weight loss phase or until you have reached your desired goal weight.  ?  Calorie Goals - Weight Maintenance Phase:  - Daily Calorie Goal of around 1000 per day (+/- 100-200 calories) once you have achieved your weight loss goal. You should continue to count and log calories for life even when at goal weight to maintain desired weight. You may need to adjust your calories up or down to maintain their weight at goal over time.  ?  Medications:  - After gastric bypass, patients should never take NSAIDS (Asprin, Advil , Aleve, Naproxen, Motrin , and Ibuprofen , etc) because of the risk of marginal ulceration.  After sleeve gastrectomy, NSAIDS can be taken after healing has occurred, typically 3-6 months after surgery.  ?  Smoking cigarettes should never occur after bariatric surgery. Minimal alcohol intake is acceptable 3 months after surgery, but any alcohol (wine or beer) intake should be limited to 2oz. / 8 hours, as the effect of alcohol will be more pronounced.   ?  We recommend that you continue to carefully count and restrict calories according to the postoperative bariatric surgery protocol and your nutritionist-specific recommendations. Caloric intake should be  recorded separately either online or in a bariatric surgery journal for daily review. Daily calorie counting is imperative for successful and long-term weight loss, and can employ  the following tips:  - Identify a set time and place to count calories - make this process a daily habit.   - Using calorie counting aids (web-based: www.fitday.com) can help.   - Use a personal food space at home to help concentrate on food types that others in the household may not be eating.  - Advance meal planning using a daily / weekly spreadsheet.   - Time your meals to less than 10 minutes per meal (eg. use an egg timer if necessary)     Also, attendance at a support group is highly encouraged. The [facility] Support Group meets the 3rd Wednesday of every month at 5pm - the sessions are currently being held virtually due to COVID-19.  Please join our email list to receive the Zoom information each month. Patients should feel free to join this group any time and bring friends or family members. To add an email to the support group mailing list, send request to: [email or web address].     I, Lauraine Ronnald Parents, PA-C, spent a total of 40 minutes on this patient's care on the day of their visit excluding time spent related to any billed procedures. This time includes time spent with the patient as well as time spent documenting in the medical record, reviewing patient's records and tests, obtaining history, placing orders, communicating with other healthcare professionals, counseling the patient, family, or caregiver, and/or care coordination for the diagnoses above.    APP Visit Information:   APP Service Type:  Independent  Available MD consultant:  Taft DOROTHA Gosling, MD         Lauraine Parents, PA-C   Surgery Faculty Practice: Bariatrics   [phone]
